# Patient Record
Sex: Female | Born: 1935 | Race: White | Hispanic: No | Marital: Married | Smoking: Former smoker
Health system: Southern US, Community
[De-identification: ages and names within clinical notes are randomized; demographics above are authoritative.]

## PROBLEM LIST (undated history)

## (undated) DIAGNOSIS — I739 Peripheral vascular disease, unspecified: Secondary | ICD-10-CM

## (undated) DIAGNOSIS — K219 Gastro-esophageal reflux disease without esophagitis: Secondary | ICD-10-CM

## (undated) DIAGNOSIS — J449 Chronic obstructive pulmonary disease, unspecified: Secondary | ICD-10-CM

## (undated) DIAGNOSIS — I10 Essential (primary) hypertension: Secondary | ICD-10-CM

## (undated) DIAGNOSIS — Z9981 Dependence on supplemental oxygen: Secondary | ICD-10-CM

## (undated) DIAGNOSIS — C9000 Multiple myeloma not having achieved remission: Secondary | ICD-10-CM

## (undated) DIAGNOSIS — I509 Heart failure, unspecified: Secondary | ICD-10-CM

## (undated) DIAGNOSIS — D649 Anemia, unspecified: Secondary | ICD-10-CM

## (undated) HISTORY — DX: Heart failure, unspecified: I50.9

## (undated) HISTORY — PX: OTHER SURGICAL HISTORY: SHX169

## (undated) HISTORY — PX: PORTACATH PLACEMENT: SHX2246

## (undated) HISTORY — DX: Gastro-esophageal reflux disease without esophagitis: K21.9

## (undated) HISTORY — DX: Chronic obstructive pulmonary disease, unspecified: J44.9

## (undated) HISTORY — PX: KYPHOPLASTY: SHX5884

## (undated) HISTORY — DX: Anemia, unspecified: D64.9

## (undated) HISTORY — DX: Essential (primary) hypertension: I10

## (undated) HISTORY — DX: Multiple myeloma not having achieved remission: C90.00

---

## 2011-09-12 ENCOUNTER — Other Ambulatory Visit (HOSPITAL_COMMUNITY): Payer: Self-pay | Admitting: Family Medicine

## 2011-09-12 DIAGNOSIS — IMO0002 Reserved for concepts with insufficient information to code with codable children: Secondary | ICD-10-CM

## 2011-09-20 DIAGNOSIS — R0602 Shortness of breath: Secondary | ICD-10-CM

## 2011-09-26 ENCOUNTER — Other Ambulatory Visit (HOSPITAL_COMMUNITY): Payer: Self-pay | Admitting: Internal Medicine

## 2011-09-26 DIAGNOSIS — IMO0002 Reserved for concepts with insufficient information to code with codable children: Secondary | ICD-10-CM

## 2011-09-27 ENCOUNTER — Encounter (HOSPITAL_COMMUNITY): Payer: Self-pay | Admitting: Pharmacy Technician

## 2011-09-30 ENCOUNTER — Ambulatory Visit (HOSPITAL_COMMUNITY)
Admission: RE | Admit: 2011-09-30 | Discharge: 2011-09-30 | Disposition: A | Discharge status: Home / Self Care | Payer: Medicare Other | Source: Ambulatory Visit | Attending: Internal Medicine | Admitting: Internal Medicine

## 2011-09-30 ENCOUNTER — Other Ambulatory Visit (HOSPITAL_COMMUNITY): Payer: Self-pay | Admitting: Interventional Radiology

## 2011-09-30 ENCOUNTER — Encounter (HOSPITAL_COMMUNITY): Payer: Self-pay | Admitting: Physician Assistant

## 2011-09-30 ENCOUNTER — Other Ambulatory Visit (HOSPITAL_COMMUNITY): Payer: Self-pay | Admitting: Physician Assistant

## 2011-09-30 DIAGNOSIS — IMO0002 Reserved for concepts with insufficient information to code with codable children: Secondary | ICD-10-CM

## 2011-09-30 NOTE — H&P (Signed)
Brooke Keller is an 76 y.o. female.   Chief Complaint: Acute onset of pain between shoulder blades after coughing that has been increasingly painful since mid Oct.   HPI: New diagnosis of multiple myeloma with significant lytic lesions throughout body.  Pain mostly between shoulder blades and lower back.  Denies radicular symptoms to arms or legs.  Does have more shallow breathing secondary to pain with deep breathing. Now requires 02 to assist.  Rates pain as a 10 on a scale of 1-10.  Denies bowel or bladder changes.  Pain mostly with supine position - improved with sitting upright.  To begin chemotherapy Wednesday.  Steroids and pain meds have only minimized pain to a 7 or 8 on a scale of 1-10.  Past Medical History  Diagnosis Date  . Multiple myeloma   . Hypertension   . Diabetes mellitus   . COPD (chronic obstructive pulmonary disease)   . GERD (gastroesophageal reflux disease)     Past Surgical History  Procedure Date  . Carpal tunne   . Right fifth toe    Social History:  reports that she quit smoking about 4 years ago. She does not have any smokeless tobacco history on file. She reports that she does not drink alcohol or use illicit drugs.  Allergies:  Allergies  Allergen Reactions  . Aspirin Other (See Comments)    Chest pain    Medications Prior to Admission  Medication Sig Dispense Refill  . albuterol (PROVENTIL) (2.5 MG/3ML) 0.083% nebulizer solution Take 2.5 mg by nebulization daily. Scheduled for lunch time every day       . amitriptyline (ELAVIL) 25 MG tablet Take 25 mg by mouth at bedtime.        Marland Kitchen atenolol (TENORMIN) 100 MG tablet Take 100 mg by mouth daily.        . calcitonin, salmon, (MIACALCIN/FORTICAL) 200 UNIT/ACT nasal spray Place 1 spray into the nose daily.        Marland Kitchen dexamethasone (DECADRON) 4 MG tablet Take 20 mg by mouth every 7 (seven) days. Friday       . ipratropium-albuterol (DUONEB) 0.5-2.5 (3) MG/3ML SOLN Take 3 mLs by nebulization 2 (two) times  daily.        . metFORMIN (GLUCOPHAGE) 1000 MG tablet Take 1,000 mg by mouth 2 (two) times daily.        Marland Kitchen oxyCODONE (OXY IR/ROXICODONE) 5 MG immediate release tablet Take 10 mg by mouth every 6 (six) hours as needed. For pain       . oxycodone (OXYCONTIN) 30 MG TB12 Take 60 mg by mouth every 12 (twelve) hours.         No current facility-administered medications on file as of 09/30/2011.    No results found for this or any previous visit (from the past 48 hour(s)). No results found.  Review of Systems  Constitutional: Positive for weight loss. Negative for fever and chills.  HENT: Positive for congestion.   Eyes: Negative.   Respiratory: Positive for cough and shortness of breath. Negative for sputum production and wheezing.   Cardiovascular: Positive for leg swelling. Negative for chest pain and claudication.  Gastrointestinal: Positive for heartburn, nausea and constipation. Negative for abdominal pain and blood in stool.  Genitourinary: Negative.   Musculoskeletal: Positive for myalgias, back pain and joint pain. Negative for falls.  Skin: Negative.   Neurological: Positive for tremors and weakness. Negative for focal weakness and seizures.  Endo/Heme/Allergies: Negative.   Psychiatric/Behavioral: Positive for depression. Negative for  memory loss. The patient is nervous/anxious.     There were no vitals taken for this visit. Physical Exam  Constitutional: She is oriented to person, place, and time. She appears well-developed and well-nourished. She appears distressed.  HENT:  Head: Normocephalic and atraumatic.  Neck: Normal range of motion.  Cardiovascular: Normal rate and regular rhythm.  Exam reveals no gallop and no friction rub.   No murmur heard. Respiratory: She is in respiratory distress. She has wheezes. She has rales. She exhibits no tenderness.       Bilateral wheezes at bases posteriorly - increased dyspnea noted due to pain   GI: Soft. Bowel sounds are normal.    Musculoskeletal: She exhibits edema. She exhibits no tenderness.  Neurological: She is alert and oriented to person, place, and time.  Skin: Skin is warm and dry.       Bilateral lower extremities with dependent edema and erythema with small scab to right 3rd toe   Psychiatric: She has a normal mood and affect. Thought content normal.     Assessment/Plan Patient presents today with her family to discuss kyphoplasty procedure for painful compression fractures. She knows of her diagnosis and the compression deformities involving T6, T7, T10 and L1.  Procedure details, risks and benefits discussed with the patient and her family.  She is eager to proceed to minimize her pain prior to starting chemotherapy.  She has been scheduled for a four level procedure if she can tolerate lying on her stomach, if not, will treat the two compression fractures in the mid thoracic area as this is where she is most symptomatic.   Total time spent with patient was 45 minutes.   CAMPBELL,PAMELA D 09/30/2011, 2:56 PM

## 2011-10-01 ENCOUNTER — Other Ambulatory Visit: Payer: Self-pay | Admitting: Radiology

## 2011-10-01 ENCOUNTER — Encounter (HOSPITAL_COMMUNITY): Payer: Self-pay

## 2011-10-01 ENCOUNTER — Ambulatory Visit (HOSPITAL_COMMUNITY)
Admission: RE | Admit: 2011-10-01 | Discharge: 2011-10-01 | Disposition: A | Discharge status: Home / Self Care | Payer: Medicare Other | Source: Ambulatory Visit | Attending: Interventional Radiology | Admitting: Interventional Radiology

## 2011-10-01 ENCOUNTER — Ambulatory Visit (HOSPITAL_COMMUNITY)
Admission: RE | Admit: 2011-10-01 | Discharge: 2011-10-01 | Disposition: A | Discharge status: Home / Self Care | Payer: Medicare Other | Source: Ambulatory Visit | Attending: Radiology | Admitting: Radiology

## 2011-10-01 ENCOUNTER — Other Ambulatory Visit (HOSPITAL_COMMUNITY): Payer: Self-pay | Admitting: Interventional Radiology

## 2011-10-01 DIAGNOSIS — Z5309 Procedure and treatment not carried out because of other contraindication: Secondary | ICD-10-CM | POA: Insufficient documentation

## 2011-10-01 DIAGNOSIS — N390 Urinary tract infection, site not specified: Secondary | ICD-10-CM | POA: Insufficient documentation

## 2011-10-01 DIAGNOSIS — IMO0002 Reserved for concepts with insufficient information to code with codable children: Secondary | ICD-10-CM

## 2011-10-01 DIAGNOSIS — C9000 Multiple myeloma not having achieved remission: Secondary | ICD-10-CM | POA: Insufficient documentation

## 2011-10-01 DIAGNOSIS — M8448XA Pathological fracture, other site, initial encounter for fracture: Secondary | ICD-10-CM | POA: Insufficient documentation

## 2011-10-01 LAB — CBC
MCHC: 33 g/dL (ref 30.0–36.0)
Platelets: 227 10*3/uL (ref 150–400)
RDW: 14.5 % (ref 11.5–15.5)
WBC: 7.5 10*3/uL (ref 4.0–10.5)

## 2011-10-01 LAB — URINALYSIS, ROUTINE W REFLEX MICROSCOPIC
Bilirubin Urine: NEGATIVE
Glucose, UA: 100 mg/dL — AB
Ketones, ur: NEGATIVE mg/dL
Nitrite: POSITIVE — AB
Protein, ur: NEGATIVE mg/dL

## 2011-10-01 LAB — DIFFERENTIAL
Eosinophils Absolute: 0.2 10*3/uL (ref 0.0–0.7)
Eosinophils Relative: 3 % (ref 0–5)
Lymphocytes Relative: 8 % — ABNORMAL LOW (ref 12–46)
Lymphs Abs: 0.5 10*3/uL — ABNORMAL LOW (ref 0.7–4.0)
Monocytes Absolute: 0.7 10*3/uL (ref 0.1–1.0)

## 2011-10-01 LAB — PROTIME-INR: INR: 1.01 (ref 0.00–1.49)

## 2011-10-01 LAB — BASIC METABOLIC PANEL
BUN: 28 mg/dL — ABNORMAL HIGH (ref 6–23)
Calcium: 9.5 mg/dL (ref 8.4–10.5)
Creatinine, Ser: 0.77 mg/dL (ref 0.50–1.10)
GFR calc Af Amer: >90 mL/min (ref >90)

## 2011-10-01 LAB — URINE MICROSCOPIC-ADD ON

## 2011-10-01 MED ORDER — MORPHINE SULFATE 2 MG/ML IJ SOLN
1.0000 mg | INTRAMUSCULAR | Status: DC | PRN
  Administered 2011-10-01: 1 mg via INTRAVENOUS

## 2011-10-01 MED ORDER — CIPROFLOXACIN IN D5W 400 MG/200ML IV SOLN
400.0000 mg | INTRAVENOUS | Status: AC
  Administered 2011-10-01: 400 mg via INTRAVENOUS
  Filled 2011-10-01: qty 200

## 2011-10-01 MED ORDER — SODIUM CHLORIDE 0.9 % IV SOLN: INTRAVENOUS | Status: DC

## 2011-10-01 MED ORDER — CEFAZOLIN SODIUM 1-5 GM-% IV SOLN
1.0000 g | INTRAVENOUS | Status: DC
  Filled 2011-10-01: qty 50

## 2011-10-01 MED ORDER — FENTANYL CITRATE 0.05 MG/ML IJ SOLN
INTRAMUSCULAR | Status: AC
  Filled 2011-10-01: qty 6

## 2011-10-01 MED ORDER — HYDROMORPHONE HCL PF 1 MG/ML IJ SOLN
INTRAMUSCULAR | Status: AC
  Filled 2011-10-01: qty 2

## 2011-10-01 MED ORDER — HYDRALAZINE HCL 20 MG/ML IJ SOLN
INTRAMUSCULAR | Status: AC
  Filled 2011-10-01: qty 1

## 2011-10-01 MED ORDER — MIDAZOLAM HCL 2 MG/2ML IJ SOLN
INTRAMUSCULAR | Status: AC
  Filled 2011-10-01: qty 6

## 2011-10-01 MED ORDER — MORPHINE SULFATE 4 MG/ML IJ SOLN
INTRAMUSCULAR | Status: AC
  Filled 2011-10-01: qty 1

## 2011-10-01 NOTE — Progress Notes (Signed)
Cipro infused.  Pt's IV d/c'd.  Pt discharged to the care of her daughters.

## 2011-10-01 NOTE — Progress Notes (Signed)
Patient presented to the [facility] today in preparation for kyphoplasty/vertebroplasty procedure to treat painful compression fractures secondary to multiple myeloma. The patient was noted to be febrile with an elevated WBC and UTI on urinalysis exam.  After discussion with the patient, family and Dr. Isabel Caprice who is covering for Dr. Earma Reading this week - it was elected to postpone the treatment for a few days until UTI was treated given the increased risks of infection seeding to the spine.  The patient was administered 400 mg IV cipro today and was provided a prescription for Cipro XR 500 mg to be taken bid x 5 days per Dr. Cleone Slim.  Dr. Cleone Slim is okay with rescheduling the treatment within the next 48 hours and the patient is to proceed with her chemotherapy.  They are all in agreement with this plan.  Labs will be rechecked upon her rescheduled date.

## 2011-10-02 ENCOUNTER — Other Ambulatory Visit: Payer: Self-pay | Admitting: Neurology

## 2011-10-03 ENCOUNTER — Ambulatory Visit (HOSPITAL_COMMUNITY): Admission: RE | Admit: 2011-10-03 | Payer: Medicare Other | Source: Ambulatory Visit

## 2011-10-10 ENCOUNTER — Other Ambulatory Visit (HOSPITAL_COMMUNITY): Payer: Self-pay | Admitting: Interventional Radiology

## 2011-10-10 ENCOUNTER — Encounter (HOSPITAL_COMMUNITY): Payer: Self-pay | Admitting: Pharmacy Technician

## 2011-10-11 ENCOUNTER — Ambulatory Visit (HOSPITAL_COMMUNITY)
Admission: RE | Admit: 2011-10-11 | Discharge: 2011-10-11 | Disposition: A | Discharge status: Home / Self Care | Payer: Medicare Other | Source: Ambulatory Visit | Attending: Interventional Radiology | Admitting: Interventional Radiology

## 2011-10-11 ENCOUNTER — Encounter (HOSPITAL_COMMUNITY): Payer: Self-pay

## 2011-10-11 ENCOUNTER — Other Ambulatory Visit: Payer: Self-pay | Admitting: Radiology

## 2011-10-11 DIAGNOSIS — Z538 Procedure and treatment not carried out for other reasons: Secondary | ICD-10-CM | POA: Insufficient documentation

## 2011-10-11 DIAGNOSIS — IMO0002 Reserved for concepts with insufficient information to code with codable children: Secondary | ICD-10-CM

## 2011-10-11 DIAGNOSIS — M8448XA Pathological fracture, other site, initial encounter for fracture: Secondary | ICD-10-CM | POA: Insufficient documentation

## 2011-10-11 LAB — DIFFERENTIAL
Eosinophils Relative: 5 % (ref 0–5)
Lymphocytes Relative: 8 % — ABNORMAL LOW (ref 12–46)
Lymphs Abs: 0.5 10*3/uL — ABNORMAL LOW (ref 0.7–4.0)
Monocytes Absolute: 0.5 10*3/uL (ref 0.1–1.0)

## 2011-10-11 LAB — CBC
HCT: 33.7 % — ABNORMAL LOW (ref 36.0–46.0)
MCH: 29.8 pg (ref 26.0–34.0)
MCV: 92.8 fL (ref 78.0–100.0)
RBC: 3.63 MIL/uL — ABNORMAL LOW (ref 3.87–5.11)
RDW: 14.8 % (ref 11.5–15.5)
WBC: 7.2 10*3/uL (ref 4.0–10.5)

## 2011-10-11 MED ORDER — TOBRAMYCIN SULFATE 1.2 G IJ SOLR
INTRAMUSCULAR | Status: AC
  Filled 2011-10-11: qty 1.2

## 2011-10-11 MED ORDER — CEFAZOLIN SODIUM 1-5 GM-% IV SOLN
1.0000 g | Freq: Once | INTRAVENOUS | Status: DC
  Filled 2011-10-11: qty 50

## 2011-10-11 MED ORDER — MIDAZOLAM HCL 2 MG/2ML IJ SOLN
INTRAMUSCULAR | Status: AC
  Filled 2011-10-11: qty 6

## 2011-10-11 MED ORDER — FENTANYL CITRATE 0.05 MG/ML IJ SOLN
INTRAMUSCULAR | Status: AC
  Filled 2011-10-11: qty 4

## 2011-10-11 MED ORDER — SODIUM CHLORIDE 0.9 % IV SOLN: INTRAVENOUS | Status: DC

## 2011-10-11 NOTE — H&P (Signed)
Brooke Keller is an 76 y.o. female.   Chief Complaint: Hx of Multiple Myeloma; painful fractures at Thoracic #6; #7; #10; Lumbar #1 HPI: scheduled today for T6; T7; T10; L1 Vertebroplasty / Kyphoplasty  Past Medical History  Diagnosis Date  . Hypertension   . Diabetes mellitus   . COPD (chronic obstructive pulmonary disease)   . GERD (gastroesophageal reflux disease)   . Multiple myeloma     Past Surgical History  Procedure Date  . Carpal tunne   . Right fifth toe     History reviewed. No pertinent family history. Social History:  reports that she quit smoking about 4 years ago. She quit smokeless tobacco use about 5 years ago. She reports that she does not drink alcohol or use illicit drugs.  Allergies:  Allergies  Allergen Reactions  . Aspirin Other (See Comments)    Chest pain    Medications Prior to Admission  Medication Sig Dispense Refill  . albuterol (PROVENTIL) (2.5 MG/3ML) 0.083% nebulizer solution Take 2.5 mg by nebulization daily. Scheduled for lunch time every day       . aspirin EC 81 MG tablet Take 81 mg by mouth daily.        . atenolol (TENORMIN) 100 MG tablet Take 100 mg by mouth daily.        . calcitonin, salmon, (MIACALCIN/FORTICAL) 200 UNIT/ACT nasal spray Place 1 spray into the nose daily. ALTERNATE NOSTRILS DAILY      . furosemide (LASIX) 20 MG tablet Take 20 mg by mouth daily.        . ipratropium-albuterol (DUONEB) 0.5-2.5 (3) MG/3ML SOLN Take 3 mLs by nebulization 2 (two) times daily.        . lenalidomide (REVLIMID) 10 MG capsule Take 10 mg by mouth daily. Take for 21 days       . lisinopril (PRINIVIL,ZESTRIL) 10 MG tablet Take 10 mg by mouth daily.        . LORazepam (ATIVAN) 1 MG tablet Take 1 mg by mouth every 4 (four) hours as needed. FOR ANXIETY       . metFORMIN (GLUCOPHAGE) 1000 MG tablet Take 1,000 mg by mouth 2 (two) times daily.        . morphine (MS CONTIN) 60 MG 12 hr tablet Take 60 mg by mouth every 12 (twelve) hours.        .  oxyCODONE (ROXICODONE) 15 MG immediate release tablet Take 15 mg by mouth 2 (two) times daily. FOR PAIN. CAN TAKE EVERY 6 HOURS IF NEEDED.        Medications Prior to Admission  Medication Dose Route Frequency Provider Last Rate Last Dose  . 0.9 %  sodium chloride infusion   Intravenous Continuous Baily Serpe A Cinsere Mizrahi, PA      . ceFAZolin (ANCEF) IVPB 1 g/50 mL premix  1 g Intravenous Once Waylen Depaolo A Avry Monteleone, PA        Results for orders placed during the hospital encounter of 10/11/11 (from the past 48 hour(s))  CBC     Status: Abnormal   Collection Time   10/11/11  9:53 AM      Component Value Range Comment   WBC 7.2  4.0 - 10.5 (K/uL)    RBC 3.63 (*) 3.87 - 5.11 (MIL/uL)    Hemoglobin 10.8 (*) 12.0 - 15.0 (g/dL)    HCT 33.7 (*) 36.0 - 46.0 (%)    MCV 92.8  78.0 - 100.0 (fL)    MCH 29.8  26.0 - 34.0 (pg)      MCHC 32.0  30.0 - 36.0 (g/dL)    RDW 14.8  11.5 - 15.5 (%)    Platelets 175  150 - 400 (K/uL)   DIFFERENTIAL     Status: Abnormal   Collection Time   10/11/11  9:53 AM      Component Value Range Comment   Neutrophils Relative 80 (*) 43 - 77 (%)    Neutro Abs 5.7  1.7 - 7.7 (K/uL)    Lymphocytes Relative 8 (*) 12 - 46 (%)    Lymphs Abs 0.5 (*) 0.7 - 4.0 (K/uL)    Monocytes Relative 7  3 - 12 (%)    Monocytes Absolute 0.5  0.1 - 1.0 (K/uL)    Eosinophils Relative 5  0 - 5 (%)    Eosinophils Absolute 0.3  0.0 - 0.7 (K/uL)    Basophils Relative 1  0 - 1 (%)    Basophils Absolute 0.0  0.0 - 0.1 (K/uL)    No results found.  Review of Systems  Constitutional: Negative for fever.  Respiratory: Negative for cough.   Cardiovascular: Negative for chest pain.  Gastrointestinal: Negative for nausea, vomiting and abdominal pain.    Blood pressure 167/80, pulse 62, temperature 97.1 F (36.2 C), temperature source Oral, resp. rate 16, height 4' 7" (1.397 m), weight 173 lb (78.472 kg), SpO2 94.00%. Physical Exam  Constitutional: She is oriented to person, place, and time. She appears  well-developed and well-nourished.  HENT:  Head: Normocephalic.  Eyes: EOM are normal.  Neck: Normal range of motion.  Cardiovascular: Normal rate, regular rhythm and normal heart sounds.   No murmur heard. Respiratory: Effort normal. She has no wheezes.       Distant lung sounds; Hx COPD  Musculoskeletal:       Uses wheel chair  Neurological: She is alert and oriented to person, place, and time.  Skin: Skin is warm and dry.     Assessment/Plan Pt with Hx of MM; now with painful fractures at T6/7/10;L1. Scheduled for Vertebroplasty/Kyphoplasty at these levels. Pt and family aware of procedure benefits and risks and agreeable to proceed. Consent signed.  Marrianne Sica A 10/11/2011, 10:19 AM    

## 2011-10-14 ENCOUNTER — Other Ambulatory Visit (HOSPITAL_COMMUNITY): Payer: Self-pay | Admitting: Interventional Radiology

## 2011-10-14 ENCOUNTER — Ambulatory Visit (HOSPITAL_COMMUNITY)
Admission: RE | Admit: 2011-10-14 | Discharge: 2011-10-14 | Disposition: A | Discharge status: Home / Self Care | Payer: Medicare Other | Source: Ambulatory Visit | Attending: Interventional Radiology | Admitting: Interventional Radiology

## 2011-10-14 DIAGNOSIS — J449 Chronic obstructive pulmonary disease, unspecified: Secondary | ICD-10-CM | POA: Insufficient documentation

## 2011-10-14 DIAGNOSIS — IMO0002 Reserved for concepts with insufficient information to code with codable children: Secondary | ICD-10-CM

## 2011-10-14 DIAGNOSIS — M8448XA Pathological fracture, other site, initial encounter for fracture: Secondary | ICD-10-CM | POA: Insufficient documentation

## 2011-10-14 DIAGNOSIS — I10 Essential (primary) hypertension: Secondary | ICD-10-CM | POA: Insufficient documentation

## 2011-10-14 DIAGNOSIS — K219 Gastro-esophageal reflux disease without esophagitis: Secondary | ICD-10-CM | POA: Insufficient documentation

## 2011-10-14 DIAGNOSIS — C9000 Multiple myeloma not having achieved remission: Secondary | ICD-10-CM | POA: Insufficient documentation

## 2011-10-14 DIAGNOSIS — E119 Type 2 diabetes mellitus without complications: Secondary | ICD-10-CM | POA: Insufficient documentation

## 2011-10-14 DIAGNOSIS — J4489 Other specified chronic obstructive pulmonary disease: Secondary | ICD-10-CM | POA: Insufficient documentation

## 2011-10-14 MED ORDER — MIDAZOLAM HCL 5 MG/5ML IJ SOLN
INTRAMUSCULAR | Status: AC | PRN
  Administered 2011-10-14: 1 mg via INTRAVENOUS

## 2011-10-14 MED ORDER — SODIUM CHLORIDE 0.9 % IV SOLN
INTRAVENOUS | Status: AC
Start: 2011-10-14 — End: 2011-10-14

## 2011-10-14 MED ORDER — TOBRAMYCIN SULFATE 1.2 G IJ SOLR
INTRAMUSCULAR | Status: AC
  Filled 2011-10-14: qty 1.2

## 2011-10-14 MED ORDER — SODIUM CHLORIDE 0.9 % IV SOLN
INTRAVENOUS | Status: AC | PRN
  Administered 2011-10-14: 75 mL/h via INTRAVENOUS

## 2011-10-14 MED ORDER — FENTANYL CITRATE 0.05 MG/ML IJ SOLN
INTRAMUSCULAR | Status: AC
  Filled 2011-10-14: qty 4

## 2011-10-14 MED ORDER — ALBUTEROL SULFATE (5 MG/ML) 0.5% IN NEBU
INHALATION_SOLUTION | RESPIRATORY_TRACT | Status: AC
  Administered 2011-10-14: 5 mg via ORAL
  Filled 2011-10-14: qty 0.5

## 2011-10-14 MED ORDER — FENTANYL CITRATE 0.05 MG/ML IJ SOLN
INTRAMUSCULAR | Status: AC | PRN
  Administered 2011-10-14: 25 ug via INTRAVENOUS

## 2011-10-14 MED ORDER — CEFAZOLIN SODIUM 1-5 GM-% IV SOLN
1.0000 g | Freq: Once | INTRAVENOUS | Status: DC
  Filled 2011-10-14: qty 50

## 2011-10-14 MED ORDER — MIDAZOLAM HCL 2 MG/2ML IJ SOLN
INTRAMUSCULAR | Status: AC
  Filled 2011-10-14: qty 6

## 2011-10-14 MED ORDER — ALBUTEROL SULFATE (5 MG/ML) 0.5% IN NEBU: 2.5000 mg | INHALATION_SOLUTION | Freq: Once | RESPIRATORY_TRACT | Status: DC

## 2011-10-14 MED ORDER — CEFAZOLIN SODIUM 1-5 GM-% IV SOLN
INTRAVENOUS | Status: AC
  Administered 2011-10-14: 1 g via INTRAVENOUS
  Filled 2011-10-14: qty 50

## 2011-10-14 MED ORDER — SODIUM CHLORIDE 0.9 % IV SOLN: INTRAVENOUS | Status: DC

## 2011-10-14 NOTE — Interval H&P Note (Cosign Needed)
History and Physical Interval Note: No new changes in ROS or PE since last seen - procedure had been cancelled due to room malfunction. Patient presents today for treatment of symptomatic compression fractures.   10/14/2011 9:05 AM  Brooke Keller  has presented today for surgery, with the diagnosis of * multiple painful compression fractures. *  The various methods of treatment have been discussed with the patient and family. After consideration of risks, benefits and other options for treatment, the patient has consented to vertebroplasty and kyphoplasty of T6, T7, T10 and L1  as a surgical intervention .  The patients' history has been reviewed, patient examined, no change in status, stable for surgery.  I have reviewed the patients' chart and labs.  Questions were answered to the patient's satisfaction.  Written consent obtained.    Brooke Keller

## 2011-10-14 NOTE — H&P (View-Only) (Signed)
Brooke Keller is an 76 y.o. female.   Chief Complaint: Hx of Multiple Myeloma; painful fractures at Thoracic #6; #7; #10; Lumbar #1 HPI: scheduled today for T6; T7; T10; L1 Vertebroplasty / Kyphoplasty  Past Medical History  Diagnosis Date  . Hypertension   . Diabetes mellitus   . COPD (chronic obstructive pulmonary disease)   . GERD (gastroesophageal reflux disease)   . Multiple myeloma     Past Surgical History  Procedure Date  . Carpal tunne   . Right fifth toe     History reviewed. No pertinent family history. Social History:  reports that she quit smoking about 4 years ago. She quit smokeless tobacco use about 5 years ago. She reports that she does not drink alcohol or use illicit drugs.  Allergies:  Allergies  Allergen Reactions  . Aspirin Other (See Comments)    Chest pain    Medications Prior to Admission  Medication Sig Dispense Refill  . albuterol (PROVENTIL) (2.5 MG/3ML) 0.083% nebulizer solution Take 2.5 mg by nebulization daily. Scheduled for lunch time every day       . aspirin EC 81 MG tablet Take 81 mg by mouth daily.        Marland Kitchen atenolol (TENORMIN) 100 MG tablet Take 100 mg by mouth daily.        . calcitonin, salmon, (MIACALCIN/FORTICAL) 200 UNIT/ACT nasal spray Place 1 spray into the nose daily. ALTERNATE NOSTRILS DAILY      . furosemide (LASIX) 20 MG tablet Take 20 mg by mouth daily.        Marland Kitchen ipratropium-albuterol (DUONEB) 0.5-2.5 (3) MG/3ML SOLN Take 3 mLs by nebulization 2 (two) times daily.        Marland Kitchen lenalidomide (REVLIMID) 10 MG capsule Take 10 mg by mouth daily. Take for 21 days       . lisinopril (PRINIVIL,ZESTRIL) 10 MG tablet Take 10 mg by mouth daily.        Marland Kitchen LORazepam (ATIVAN) 1 MG tablet Take 1 mg by mouth every 4 (four) hours as needed. FOR ANXIETY       . metFORMIN (GLUCOPHAGE) 1000 MG tablet Take 1,000 mg by mouth 2 (two) times daily.        Marland Kitchen morphine (MS CONTIN) 60 MG 12 hr tablet Take 60 mg by mouth every 12 (twelve) hours.        Marland Kitchen  oxyCODONE (ROXICODONE) 15 MG immediate release tablet Take 15 mg by mouth 2 (two) times daily. FOR PAIN. CAN TAKE EVERY 6 HOURS IF NEEDED.        Medications Prior to Admission  Medication Dose Route Frequency Provider Last Rate Last Dose  . 0.9 %  sodium chloride infusion   Intravenous Continuous Robet Leu, PA      . ceFAZolin (ANCEF) IVPB 1 g/50 mL premix  1 g Intravenous Once Robet Leu, PA        Results for orders placed during the hospital encounter of 10/11/11 (from the past 48 hour(s))  CBC     Status: Abnormal   Collection Time   10/11/11  9:53 AM      Component Value Range Comment   WBC 7.2  4.0 - 10.5 (K/uL)    RBC 3.63 (*) 3.87 - 5.11 (MIL/uL)    Hemoglobin 10.8 (*) 12.0 - 15.0 (g/dL)    HCT 40.9 (*) 81.1 - 46.0 (%)    MCV 92.8  78.0 - 100.0 (fL)    MCH 29.8  26.0 - 34.0 (pg)  MCHC 32.0  30.0 - 36.0 (g/dL)    RDW 16.1  09.6 - 04.5 (%)    Platelets 175  150 - 400 (K/uL)   DIFFERENTIAL     Status: Abnormal   Collection Time   10/11/11  9:53 AM      Component Value Range Comment   Neutrophils Relative 80 (*) 43 - 77 (%)    Neutro Abs 5.7  1.7 - 7.7 (K/uL)    Lymphocytes Relative 8 (*) 12 - 46 (%)    Lymphs Abs 0.5 (*) 0.7 - 4.0 (K/uL)    Monocytes Relative 7  3 - 12 (%)    Monocytes Absolute 0.5  0.1 - 1.0 (K/uL)    Eosinophils Relative 5  0 - 5 (%)    Eosinophils Absolute 0.3  0.0 - 0.7 (K/uL)    Basophils Relative 1  0 - 1 (%)    Basophils Absolute 0.0  0.0 - 0.1 (K/uL)    No results found.  Review of Systems  Constitutional: Negative for fever.  Respiratory: Negative for cough.   Cardiovascular: Negative for chest pain.  Gastrointestinal: Negative for nausea, vomiting and abdominal pain.    Blood pressure 167/80, pulse 62, temperature 97.1 F (36.2 C), temperature source Oral, resp. rate 16, height 4\' 7"  (1.397 m), weight 173 lb (78.472 kg), SpO2 94.00%. Physical Exam  Constitutional: She is oriented to person, place, and time. She appears  well-developed and well-nourished.  HENT:  Head: Normocephalic.  Eyes: EOM are normal.  Neck: Normal range of motion.  Cardiovascular: Normal rate, regular rhythm and normal heart sounds.   No murmur heard. Respiratory: Effort normal. She has no wheezes.       Distant lung sounds; Hx COPD  Musculoskeletal:       Uses wheel chair  Neurological: She is alert and oriented to person, place, and time.  Skin: Skin is warm and dry.     Assessment/Plan Pt with Hx of MM; now with painful fractures at T6/7/10;L1. Scheduled for Vertebroplasty/Kyphoplasty at these levels. Pt and family aware of procedure benefits and risks and agreeable to proceed. Consent signed.  Anelle Parlow A 10/11/2011, 10:19 AM

## 2011-10-14 NOTE — Procedures (Signed)
S/P T6 and T7  vertebroplasty for severe pain No acute complications.

## 2011-10-21 ENCOUNTER — Other Ambulatory Visit (HOSPITAL_COMMUNITY): Payer: Self-pay | Admitting: Interventional Radiology

## 2011-10-21 DIAGNOSIS — IMO0002 Reserved for concepts with insufficient information to code with codable children: Secondary | ICD-10-CM

## 2011-10-28 ENCOUNTER — Inpatient Hospital Stay (HOSPITAL_COMMUNITY): Admission: RE | Admit: 2011-10-28 | Payer: Medicare Other | Source: Ambulatory Visit

## 2012-02-26 ENCOUNTER — Encounter: Payer: Medicare Other | Admitting: Internal Medicine

## 2012-03-25 ENCOUNTER — Encounter: Payer: Medicare Other | Admitting: Internal Medicine

## 2012-03-25 DIAGNOSIS — C9000 Multiple myeloma not having achieved remission: Secondary | ICD-10-CM

## 2012-04-14 ENCOUNTER — Encounter (INDEPENDENT_AMBULATORY_CARE_PROVIDER_SITE_OTHER): Payer: Medicare Other | Admitting: Internal Medicine

## 2012-04-14 DIAGNOSIS — C8589 Other specified types of non-Hodgkin lymphoma, extranodal and solid organ sites: Secondary | ICD-10-CM

## 2012-04-14 DIAGNOSIS — D649 Anemia, unspecified: Secondary | ICD-10-CM

## 2012-04-14 DIAGNOSIS — J449 Chronic obstructive pulmonary disease, unspecified: Secondary | ICD-10-CM

## 2012-04-23 ENCOUNTER — Encounter: Payer: Medicare Other | Admitting: Internal Medicine

## 2012-04-27 DIAGNOSIS — C9000 Multiple myeloma not having achieved remission: Secondary | ICD-10-CM

## 2012-05-21 DIAGNOSIS — C9000 Multiple myeloma not having achieved remission: Secondary | ICD-10-CM

## 2012-05-26 ENCOUNTER — Encounter: Payer: Medicare Other | Admitting: Internal Medicine

## 2012-05-26 DIAGNOSIS — C9000 Multiple myeloma not having achieved remission: Secondary | ICD-10-CM

## 2012-05-26 DIAGNOSIS — D649 Anemia, unspecified: Secondary | ICD-10-CM

## 2012-06-23 DIAGNOSIS — C9000 Multiple myeloma not having achieved remission: Secondary | ICD-10-CM

## 2012-07-21 ENCOUNTER — Encounter: Payer: Medicare Other | Admitting: Internal Medicine

## 2012-07-21 DIAGNOSIS — C9000 Multiple myeloma not having achieved remission: Secondary | ICD-10-CM

## 2012-08-18 DIAGNOSIS — R944 Abnormal results of kidney function studies: Secondary | ICD-10-CM

## 2012-08-18 DIAGNOSIS — J449 Chronic obstructive pulmonary disease, unspecified: Secondary | ICD-10-CM

## 2012-08-18 DIAGNOSIS — C9000 Multiple myeloma not having achieved remission: Secondary | ICD-10-CM

## 2012-08-18 DIAGNOSIS — E119 Type 2 diabetes mellitus without complications: Secondary | ICD-10-CM

## 2012-09-07 ENCOUNTER — Encounter: Payer: Medicare Other | Admitting: Internal Medicine

## 2012-09-07 DIAGNOSIS — N189 Chronic kidney disease, unspecified: Secondary | ICD-10-CM

## 2012-09-07 DIAGNOSIS — D649 Anemia, unspecified: Secondary | ICD-10-CM

## 2012-09-07 DIAGNOSIS — C9000 Multiple myeloma not having achieved remission: Secondary | ICD-10-CM

## 2012-10-06 ENCOUNTER — Other Ambulatory Visit (HOSPITAL_COMMUNITY): Payer: Self-pay | Admitting: Internal Medicine

## 2012-10-06 DIAGNOSIS — C9000 Multiple myeloma not having achieved remission: Secondary | ICD-10-CM

## 2012-10-12 ENCOUNTER — Other Ambulatory Visit: Payer: Self-pay | Admitting: Radiology

## 2012-10-15 ENCOUNTER — Encounter (HOSPITAL_COMMUNITY): Payer: Self-pay

## 2012-10-15 ENCOUNTER — Other Ambulatory Visit (HOSPITAL_COMMUNITY): Payer: Self-pay | Admitting: Internal Medicine

## 2012-10-15 ENCOUNTER — Ambulatory Visit (HOSPITAL_COMMUNITY)
Admission: RE | Admit: 2012-10-15 | Discharge: 2012-10-15 | Disposition: A | Discharge status: Home / Self Care | Payer: Medicare Other | Source: Ambulatory Visit | Attending: Internal Medicine | Admitting: Internal Medicine

## 2012-10-15 DIAGNOSIS — C9000 Multiple myeloma not having achieved remission: Secondary | ICD-10-CM | POA: Insufficient documentation

## 2012-10-15 DIAGNOSIS — D649 Anemia, unspecified: Secondary | ICD-10-CM | POA: Insufficient documentation

## 2012-10-15 LAB — CBC WITH DIFFERENTIAL/PLATELET
Basophils Absolute: 0 10*3/uL (ref 0.0–0.1)
Eosinophils Relative: 1 % (ref 0–5)
HCT: 28.5 % — ABNORMAL LOW (ref 36.0–46.0)
Hemoglobin: 9.7 g/dL — ABNORMAL LOW (ref 12.0–15.0)
Lymphocytes Relative: 9 % — ABNORMAL LOW (ref 12–46)
Lymphs Abs: 0.4 10*3/uL — ABNORMAL LOW (ref 0.7–4.0)
MCV: 95.6 fL (ref 78.0–100.0)
Monocytes Absolute: 0.3 10*3/uL (ref 0.1–1.0)
Monocytes Relative: 7 % (ref 3–12)
Neutro Abs: 3.7 10*3/uL (ref 1.7–7.7)
RBC: 2.98 MIL/uL — ABNORMAL LOW (ref 3.87–5.11)
RDW: 18.7 % — ABNORMAL HIGH (ref 11.5–15.5)
WBC: 4.4 10*3/uL (ref 4.0–10.5)

## 2012-10-15 LAB — APTT: aPTT: 24 seconds (ref 24–37)

## 2012-10-15 MED ORDER — CEFAZOLIN SODIUM 1-5 GM-% IV SOLN
1.0000 g | Freq: Once | INTRAVENOUS | Status: AC
  Administered 2012-10-15: 1 g via INTRAVENOUS
  Filled 2012-10-15: qty 50

## 2012-10-15 MED ORDER — SODIUM CHLORIDE 0.9 % IR SOLN
Status: AC | PRN
  Administered 2012-10-15: 15:00:00

## 2012-10-15 MED ORDER — SODIUM CHLORIDE 0.9 % IV SOLN
Freq: Once | INTRAVENOUS | Status: AC
  Administered 2012-10-15: 14:00:00 via INTRAVENOUS

## 2012-10-15 MED ORDER — MIDAZOLAM HCL 2 MG/2ML IJ SOLN
INTRAMUSCULAR | Status: AC | PRN
  Administered 2012-10-15 (×2): 1 mg via INTRAVENOUS

## 2012-10-15 MED ORDER — MIDAZOLAM HCL 2 MG/2ML IJ SOLN
INTRAMUSCULAR | Status: AC
  Filled 2012-10-15: qty 4

## 2012-10-15 MED ORDER — FENTANYL CITRATE 0.05 MG/ML IJ SOLN
INTRAMUSCULAR | Status: AC
  Filled 2012-10-15: qty 4

## 2012-10-15 MED ORDER — FENTANYL CITRATE 0.05 MG/ML IJ SOLN
INTRAMUSCULAR | Status: AC | PRN
  Administered 2012-10-15: 50 ug via INTRAVENOUS

## 2012-10-15 NOTE — H&P (Signed)
Chief Complaint: "I'm here for port" Referring Physician:Darovsky HPI: Brooke Keller is an 78 y.o. female with myeloma and is referred to IR for port placement as she is to receive chemotherapy. PMHX and meds reviewed. She has otherwise been feeling ok. She is on chronic home O2 for COPD but denies SOB, CP  Past Medical History:  Past Medical History  Diagnosis Date  . Hypertension   . Diabetes mellitus   . COPD (chronic obstructive pulmonary disease)   . GERD (gastroesophageal reflux disease)   . Multiple myeloma(203.0)     Past Surgical History:  Past Surgical History  Procedure Date  . Carpal tunne   . Right fifth toe     Family History: No family history on file.  Social History:  reports that she quit smoking about 5 years ago. She quit smokeless tobacco use about 6 years ago. She reports that she does not drink alcohol or use illicit drugs.  Allergies:  Allergies  Allergen Reactions  . Ativan (Lorazepam) Other (See Comments)    hallucinations    Medications: Proventil, ASA, Tenormin, Miacalcin, Lasix, Prinvil, Revlimid, Glucophage, Oxycontin.  Please HPI for pertinent positives, otherwise complete 10 system ROS negative.  Physical Exam: Blood pressure 122/82, pulse 87, temperature 98.3 F (36.8 C), SpO2 98.00%. There is no height or weight on file to calculate BMI.   General Appearance:  Alert, cooperative, no distress, appears stated age  Head:  Normocephalic, without obvious abnormality, atraumatic  ENT: Unremarkable  Neck: Supple, symmetrical, trachea midline, no adenopathy, thyroid: not enlarged, symmetric, no tenderness/mass/nodules  Lungs:   Clear to auscultation bilaterally, no w/r/r, respirations unlabored without use of accessory muscles.  Chest Wall:  No tenderness or deformity  Heart:  Regular rate and rhythm, S1, S2 normal, no murmur, rub or gallop. Carotids 2+ without bruit.  Neurologic: Normal affect, no gross deficits.   Results for orders  placed during the hospital encounter of 10/15/12 (from the past 48 hour(s))  APTT     Status: Normal   Collection Time   10/15/12 12:15 PM      Component Value Range Comment   aPTT 24  24 - 37 seconds   CBC WITH DIFFERENTIAL     Status: Abnormal   Collection Time   10/15/12 12:15 PM      Component Value Range Comment   WBC 4.4  4.0 - 10.5 K/uL    RBC 2.98 (*) 3.87 - 5.11 MIL/uL    Hemoglobin 9.7 (*) 12.0 - 15.0 g/dL    HCT 40.9 (*) 81.1 - 46.0 %    MCV 95.6  78.0 - 100.0 fL    MCH 32.6  26.0 - 34.0 pg    MCHC 34.0  30.0 - 36.0 g/dL    RDW 91.4 (*) 78.2 - 15.5 %    Platelets 158  150 - 400 K/uL    Neutrophils Relative 83 (*) 43 - 77 %    Neutro Abs 3.7  1.7 - 7.7 K/uL    Lymphocytes Relative 9 (*) 12 - 46 %    Lymphs Abs 0.4 (*) 0.7 - 4.0 K/uL    Monocytes Relative 7  3 - 12 %    Monocytes Absolute 0.3  0.1 - 1.0 K/uL    Eosinophils Relative 1  0 - 5 %    Eosinophils Absolute 0.0  0.0 - 0.7 K/uL    Basophils Relative 0  0 - 1 %    Basophils Absolute 0.0  0.0 - 0.1 K/uL  PROTIME-INR     Status: Normal   Collection Time   10/15/12 12:15 PM      Component Value Range Comment   Prothrombin Time 12.5  11.6 - 15.2 seconds    INR 0.94  0.00 - 1.49   GLUCOSE, CAPILLARY     Status: Abnormal   Collection Time   10/15/12 12:43 PM      Component Value Range Comment   Glucose-Capillary 132 (*) 70 - 99 mg/dL    No results found.  Assessment/Plan Myeloma For port placement, discussed procedure, risks, complications. Labs reviewed, ok. Consent signed in chart.  Brayton El PA-C 10/15/2012, 1:03 PM

## 2012-10-15 NOTE — Procedures (Signed)
Successful RT IJ POWER PAC PLACEMENT NO COMP STABLE TIP SVC/RA READY FOR USE FULL REPORT IN PACS

## 2012-10-19 ENCOUNTER — Encounter: Payer: Medicare Other | Admitting: Internal Medicine

## 2012-10-19 DIAGNOSIS — D638 Anemia in other chronic diseases classified elsewhere: Secondary | ICD-10-CM

## 2012-10-19 DIAGNOSIS — D509 Iron deficiency anemia, unspecified: Secondary | ICD-10-CM

## 2012-10-19 DIAGNOSIS — N289 Disorder of kidney and ureter, unspecified: Secondary | ICD-10-CM

## 2012-10-19 DIAGNOSIS — C9000 Multiple myeloma not having achieved remission: Secondary | ICD-10-CM

## 2012-10-26 DIAGNOSIS — D509 Iron deficiency anemia, unspecified: Secondary | ICD-10-CM

## 2012-10-26 DIAGNOSIS — C9000 Multiple myeloma not having achieved remission: Secondary | ICD-10-CM

## 2012-11-02 DIAGNOSIS — D509 Iron deficiency anemia, unspecified: Secondary | ICD-10-CM

## 2012-11-02 DIAGNOSIS — C9000 Multiple myeloma not having achieved remission: Secondary | ICD-10-CM

## 2012-11-09 DIAGNOSIS — D509 Iron deficiency anemia, unspecified: Secondary | ICD-10-CM

## 2012-11-09 DIAGNOSIS — C9000 Multiple myeloma not having achieved remission: Secondary | ICD-10-CM

## 2012-12-22 ENCOUNTER — Encounter: Payer: Medicare Other | Admitting: Internal Medicine

## 2012-12-22 DIAGNOSIS — C9000 Multiple myeloma not having achieved remission: Secondary | ICD-10-CM

## 2013-02-02 DIAGNOSIS — Z452 Encounter for adjustment and management of vascular access device: Secondary | ICD-10-CM

## 2013-02-02 DIAGNOSIS — C9000 Multiple myeloma not having achieved remission: Secondary | ICD-10-CM

## 2013-03-16 DIAGNOSIS — C9000 Multiple myeloma not having achieved remission: Secondary | ICD-10-CM

## 2013-04-05 ENCOUNTER — Encounter (INDEPENDENT_AMBULATORY_CARE_PROVIDER_SITE_OTHER): Payer: Medicare Other | Admitting: Internal Medicine

## 2013-04-05 DIAGNOSIS — C9000 Multiple myeloma not having achieved remission: Secondary | ICD-10-CM

## 2013-04-13 DIAGNOSIS — C9000 Multiple myeloma not having achieved remission: Secondary | ICD-10-CM

## 2013-05-24 DIAGNOSIS — Z452 Encounter for adjustment and management of vascular access device: Secondary | ICD-10-CM

## 2013-05-24 DIAGNOSIS — C9000 Multiple myeloma not having achieved remission: Secondary | ICD-10-CM

## 2013-06-16 DIAGNOSIS — C9000 Multiple myeloma not having achieved remission: Secondary | ICD-10-CM

## 2013-07-20 DIAGNOSIS — Z452 Encounter for adjustment and management of vascular access device: Secondary | ICD-10-CM

## 2013-07-20 DIAGNOSIS — C9000 Multiple myeloma not having achieved remission: Secondary | ICD-10-CM

## 2013-08-05 DIAGNOSIS — Z23 Encounter for immunization: Secondary | ICD-10-CM

## 2013-08-05 DIAGNOSIS — C9000 Multiple myeloma not having achieved remission: Secondary | ICD-10-CM

## 2013-08-05 DIAGNOSIS — J449 Chronic obstructive pulmonary disease, unspecified: Secondary | ICD-10-CM

## 2013-08-05 DIAGNOSIS — Z862 Personal history of diseases of the blood and blood-forming organs and certain disorders involving the immune mechanism: Secondary | ICD-10-CM

## 2013-08-05 DIAGNOSIS — D7589 Other specified diseases of blood and blood-forming organs: Secondary | ICD-10-CM

## 2013-08-17 DIAGNOSIS — C9000 Multiple myeloma not having achieved remission: Secondary | ICD-10-CM

## 2013-08-17 DIAGNOSIS — E538 Deficiency of other specified B group vitamins: Secondary | ICD-10-CM

## 2013-08-18 DIAGNOSIS — E538 Deficiency of other specified B group vitamins: Secondary | ICD-10-CM

## 2013-08-19 DIAGNOSIS — E538 Deficiency of other specified B group vitamins: Secondary | ICD-10-CM

## 2013-08-20 DIAGNOSIS — E538 Deficiency of other specified B group vitamins: Secondary | ICD-10-CM

## 2013-08-23 DIAGNOSIS — E538 Deficiency of other specified B group vitamins: Secondary | ICD-10-CM

## 2013-08-30 DIAGNOSIS — E538 Deficiency of other specified B group vitamins: Secondary | ICD-10-CM

## 2013-08-30 DIAGNOSIS — C9000 Multiple myeloma not having achieved remission: Secondary | ICD-10-CM

## 2013-09-03 DIAGNOSIS — E538 Deficiency of other specified B group vitamins: Secondary | ICD-10-CM

## 2013-09-13 DIAGNOSIS — C9000 Multiple myeloma not having achieved remission: Secondary | ICD-10-CM

## 2013-09-20 DIAGNOSIS — E538 Deficiency of other specified B group vitamins: Secondary | ICD-10-CM

## 2013-09-20 DIAGNOSIS — Z95828 Presence of other vascular implants and grafts: Secondary | ICD-10-CM | POA: Insufficient documentation

## 2013-11-10 ENCOUNTER — Encounter (INDEPENDENT_AMBULATORY_CARE_PROVIDER_SITE_OTHER): Payer: Medicare Other | Admitting: Ophthalmology

## 2013-11-10 DIAGNOSIS — I10 Essential (primary) hypertension: Secondary | ICD-10-CM

## 2013-11-10 DIAGNOSIS — E11319 Type 2 diabetes mellitus with unspecified diabetic retinopathy without macular edema: Secondary | ICD-10-CM

## 2013-11-10 DIAGNOSIS — H43819 Vitreous degeneration, unspecified eye: Secondary | ICD-10-CM

## 2013-11-10 DIAGNOSIS — E1139 Type 2 diabetes mellitus with other diabetic ophthalmic complication: Secondary | ICD-10-CM

## 2013-11-10 DIAGNOSIS — E1165 Type 2 diabetes mellitus with hyperglycemia: Secondary | ICD-10-CM

## 2013-11-10 DIAGNOSIS — H4089 Other specified glaucoma: Secondary | ICD-10-CM

## 2013-11-10 DIAGNOSIS — H35039 Hypertensive retinopathy, unspecified eye: Secondary | ICD-10-CM

## 2013-11-10 DIAGNOSIS — E11359 Type 2 diabetes mellitus with proliferative diabetic retinopathy without macular edema: Secondary | ICD-10-CM

## 2014-03-10 ENCOUNTER — Ambulatory Visit (INDEPENDENT_AMBULATORY_CARE_PROVIDER_SITE_OTHER): Payer: Medicare Other | Admitting: Ophthalmology

## 2014-11-02 DIAGNOSIS — J449 Chronic obstructive pulmonary disease, unspecified: Secondary | ICD-10-CM | POA: Diagnosis not present

## 2014-11-10 DIAGNOSIS — C9000 Multiple myeloma not having achieved remission: Secondary | ICD-10-CM | POA: Diagnosis not present

## 2014-11-10 DIAGNOSIS — E538 Deficiency of other specified B group vitamins: Secondary | ICD-10-CM | POA: Diagnosis not present

## 2014-12-03 DIAGNOSIS — J449 Chronic obstructive pulmonary disease, unspecified: Secondary | ICD-10-CM | POA: Diagnosis not present

## 2014-12-07 DIAGNOSIS — E538 Deficiency of other specified B group vitamins: Secondary | ICD-10-CM | POA: Diagnosis not present

## 2014-12-27 DIAGNOSIS — R0689 Other abnormalities of breathing: Secondary | ICD-10-CM | POA: Diagnosis not present

## 2014-12-27 DIAGNOSIS — G4733 Obstructive sleep apnea (adult) (pediatric): Secondary | ICD-10-CM | POA: Diagnosis not present

## 2014-12-27 DIAGNOSIS — J9610 Chronic respiratory failure, unspecified whether with hypoxia or hypercapnia: Secondary | ICD-10-CM | POA: Diagnosis not present

## 2014-12-27 DIAGNOSIS — E669 Obesity, unspecified: Secondary | ICD-10-CM | POA: Diagnosis not present

## 2014-12-27 DIAGNOSIS — J449 Chronic obstructive pulmonary disease, unspecified: Secondary | ICD-10-CM | POA: Diagnosis not present

## 2014-12-27 DIAGNOSIS — Z23 Encounter for immunization: Secondary | ICD-10-CM | POA: Diagnosis not present

## 2015-01-01 DIAGNOSIS — J449 Chronic obstructive pulmonary disease, unspecified: Secondary | ICD-10-CM | POA: Diagnosis not present

## 2015-01-05 DIAGNOSIS — E538 Deficiency of other specified B group vitamins: Secondary | ICD-10-CM | POA: Diagnosis not present

## 2015-01-05 DIAGNOSIS — C9000 Multiple myeloma not having achieved remission: Secondary | ICD-10-CM | POA: Diagnosis not present

## 2015-02-01 DIAGNOSIS — J449 Chronic obstructive pulmonary disease, unspecified: Secondary | ICD-10-CM | POA: Diagnosis not present

## 2015-02-02 DIAGNOSIS — C9000 Multiple myeloma not having achieved remission: Secondary | ICD-10-CM | POA: Diagnosis not present

## 2015-02-02 DIAGNOSIS — E538 Deficiency of other specified B group vitamins: Secondary | ICD-10-CM | POA: Diagnosis not present

## 2015-02-09 DIAGNOSIS — Z95828 Presence of other vascular implants and grafts: Secondary | ICD-10-CM | POA: Diagnosis not present

## 2015-02-09 DIAGNOSIS — G893 Neoplasm related pain (acute) (chronic): Secondary | ICD-10-CM | POA: Diagnosis not present

## 2015-02-09 DIAGNOSIS — E538 Deficiency of other specified B group vitamins: Secondary | ICD-10-CM | POA: Diagnosis not present

## 2015-02-09 DIAGNOSIS — C9000 Multiple myeloma not having achieved remission: Secondary | ICD-10-CM | POA: Diagnosis not present

## 2015-02-20 DIAGNOSIS — N183 Chronic kidney disease, stage 3 (moderate): Secondary | ICD-10-CM | POA: Diagnosis not present

## 2015-02-20 DIAGNOSIS — D630 Anemia in neoplastic disease: Secondary | ICD-10-CM | POA: Diagnosis not present

## 2015-02-20 DIAGNOSIS — E1165 Type 2 diabetes mellitus with hyperglycemia: Secondary | ICD-10-CM | POA: Diagnosis not present

## 2015-02-20 DIAGNOSIS — I10 Essential (primary) hypertension: Secondary | ICD-10-CM | POA: Diagnosis not present

## 2015-02-20 DIAGNOSIS — E871 Hypo-osmolality and hyponatremia: Secondary | ICD-10-CM | POA: Diagnosis not present

## 2015-02-27 DIAGNOSIS — C9002 Multiple myeloma in relapse: Secondary | ICD-10-CM | POA: Diagnosis not present

## 2015-02-27 DIAGNOSIS — E871 Hypo-osmolality and hyponatremia: Secondary | ICD-10-CM | POA: Diagnosis not present

## 2015-02-27 DIAGNOSIS — I10 Essential (primary) hypertension: Secondary | ICD-10-CM | POA: Diagnosis not present

## 2015-02-27 DIAGNOSIS — E1165 Type 2 diabetes mellitus with hyperglycemia: Secondary | ICD-10-CM | POA: Diagnosis not present

## 2015-02-27 DIAGNOSIS — D630 Anemia in neoplastic disease: Secondary | ICD-10-CM | POA: Diagnosis not present

## 2015-02-27 DIAGNOSIS — Z1389 Encounter for screening for other disorder: Secondary | ICD-10-CM | POA: Diagnosis not present

## 2015-03-02 DIAGNOSIS — C9000 Multiple myeloma not having achieved remission: Secondary | ICD-10-CM | POA: Diagnosis not present

## 2015-03-02 DIAGNOSIS — E538 Deficiency of other specified B group vitamins: Secondary | ICD-10-CM | POA: Diagnosis not present

## 2015-03-03 DIAGNOSIS — J449 Chronic obstructive pulmonary disease, unspecified: Secondary | ICD-10-CM | POA: Diagnosis not present

## 2015-03-30 DIAGNOSIS — C9000 Multiple myeloma not having achieved remission: Secondary | ICD-10-CM | POA: Diagnosis not present

## 2015-03-30 DIAGNOSIS — E538 Deficiency of other specified B group vitamins: Secondary | ICD-10-CM | POA: Diagnosis not present

## 2015-04-03 DIAGNOSIS — J449 Chronic obstructive pulmonary disease, unspecified: Secondary | ICD-10-CM | POA: Diagnosis not present

## 2015-04-27 DIAGNOSIS — E538 Deficiency of other specified B group vitamins: Secondary | ICD-10-CM | POA: Diagnosis not present

## 2015-04-27 DIAGNOSIS — C9000 Multiple myeloma not having achieved remission: Secondary | ICD-10-CM | POA: Diagnosis not present

## 2015-05-03 DIAGNOSIS — J449 Chronic obstructive pulmonary disease, unspecified: Secondary | ICD-10-CM | POA: Diagnosis not present

## 2015-06-03 DIAGNOSIS — J449 Chronic obstructive pulmonary disease, unspecified: Secondary | ICD-10-CM | POA: Diagnosis not present

## 2015-06-06 DIAGNOSIS — S51801A Unspecified open wound of right forearm, initial encounter: Secondary | ICD-10-CM | POA: Diagnosis not present

## 2015-06-08 DIAGNOSIS — Z95828 Presence of other vascular implants and grafts: Secondary | ICD-10-CM | POA: Diagnosis not present

## 2015-06-28 DIAGNOSIS — J449 Chronic obstructive pulmonary disease, unspecified: Secondary | ICD-10-CM | POA: Diagnosis not present

## 2015-06-28 DIAGNOSIS — G4733 Obstructive sleep apnea (adult) (pediatric): Secondary | ICD-10-CM | POA: Diagnosis not present

## 2015-06-28 DIAGNOSIS — R0689 Other abnormalities of breathing: Secondary | ICD-10-CM | POA: Diagnosis not present

## 2015-06-28 DIAGNOSIS — Z87891 Personal history of nicotine dependence: Secondary | ICD-10-CM | POA: Diagnosis not present

## 2015-06-28 DIAGNOSIS — Z23 Encounter for immunization: Secondary | ICD-10-CM | POA: Diagnosis not present

## 2015-07-04 DIAGNOSIS — J449 Chronic obstructive pulmonary disease, unspecified: Secondary | ICD-10-CM | POA: Diagnosis not present

## 2015-07-27 DIAGNOSIS — J439 Emphysema, unspecified: Secondary | ICD-10-CM | POA: Diagnosis not present

## 2015-07-27 DIAGNOSIS — E1169 Type 2 diabetes mellitus with other specified complication: Secondary | ICD-10-CM | POA: Diagnosis not present

## 2015-07-27 DIAGNOSIS — R5383 Other fatigue: Secondary | ICD-10-CM | POA: Diagnosis not present

## 2015-07-27 DIAGNOSIS — C9002 Multiple myeloma in relapse: Secondary | ICD-10-CM | POA: Diagnosis not present

## 2015-07-27 DIAGNOSIS — E1122 Type 2 diabetes mellitus with diabetic chronic kidney disease: Secondary | ICD-10-CM | POA: Diagnosis not present

## 2015-07-27 DIAGNOSIS — E1165 Type 2 diabetes mellitus with hyperglycemia: Secondary | ICD-10-CM | POA: Diagnosis not present

## 2015-07-28 DIAGNOSIS — E538 Deficiency of other specified B group vitamins: Secondary | ICD-10-CM | POA: Diagnosis not present

## 2015-07-28 DIAGNOSIS — C9000 Multiple myeloma not having achieved remission: Secondary | ICD-10-CM | POA: Diagnosis not present

## 2015-08-03 DIAGNOSIS — J449 Chronic obstructive pulmonary disease, unspecified: Secondary | ICD-10-CM | POA: Diagnosis not present

## 2015-08-16 DIAGNOSIS — Z95828 Presence of other vascular implants and grafts: Secondary | ICD-10-CM | POA: Diagnosis not present

## 2015-08-16 DIAGNOSIS — C9000 Multiple myeloma not having achieved remission: Secondary | ICD-10-CM | POA: Diagnosis not present

## 2015-08-30 DIAGNOSIS — N183 Chronic kidney disease, stage 3 (moderate): Secondary | ICD-10-CM | POA: Diagnosis not present

## 2015-08-30 DIAGNOSIS — E1122 Type 2 diabetes mellitus with diabetic chronic kidney disease: Secondary | ICD-10-CM | POA: Diagnosis not present

## 2015-08-30 DIAGNOSIS — E871 Hypo-osmolality and hyponatremia: Secondary | ICD-10-CM | POA: Diagnosis not present

## 2015-08-30 DIAGNOSIS — I10 Essential (primary) hypertension: Secondary | ICD-10-CM | POA: Diagnosis not present

## 2015-08-30 DIAGNOSIS — D630 Anemia in neoplastic disease: Secondary | ICD-10-CM | POA: Diagnosis not present

## 2015-09-03 DIAGNOSIS — J449 Chronic obstructive pulmonary disease, unspecified: Secondary | ICD-10-CM | POA: Diagnosis not present

## 2015-09-04 DIAGNOSIS — Z0001 Encounter for general adult medical examination with abnormal findings: Secondary | ICD-10-CM | POA: Diagnosis not present

## 2015-09-04 DIAGNOSIS — Z23 Encounter for immunization: Secondary | ICD-10-CM | POA: Diagnosis not present

## 2015-09-04 DIAGNOSIS — H6123 Impacted cerumen, bilateral: Secondary | ICD-10-CM | POA: Diagnosis not present

## 2015-09-04 DIAGNOSIS — E559 Vitamin D deficiency, unspecified: Secondary | ICD-10-CM | POA: Diagnosis not present

## 2015-09-27 DIAGNOSIS — C9000 Multiple myeloma not having achieved remission: Secondary | ICD-10-CM | POA: Diagnosis not present

## 2015-09-27 DIAGNOSIS — Z95828 Presence of other vascular implants and grafts: Secondary | ICD-10-CM | POA: Diagnosis not present

## 2015-10-03 DIAGNOSIS — J449 Chronic obstructive pulmonary disease, unspecified: Secondary | ICD-10-CM | POA: Diagnosis not present

## 2015-10-27 DIAGNOSIS — E538 Deficiency of other specified B group vitamins: Secondary | ICD-10-CM | POA: Diagnosis not present

## 2015-10-27 DIAGNOSIS — C9000 Multiple myeloma not having achieved remission: Secondary | ICD-10-CM | POA: Diagnosis not present

## 2015-10-27 DIAGNOSIS — G893 Neoplasm related pain (acute) (chronic): Secondary | ICD-10-CM | POA: Diagnosis not present

## 2015-10-27 DIAGNOSIS — Z95828 Presence of other vascular implants and grafts: Secondary | ICD-10-CM | POA: Diagnosis not present

## 2015-11-03 DIAGNOSIS — J449 Chronic obstructive pulmonary disease, unspecified: Secondary | ICD-10-CM | POA: Diagnosis not present

## 2015-12-04 DIAGNOSIS — J449 Chronic obstructive pulmonary disease, unspecified: Secondary | ICD-10-CM | POA: Diagnosis not present

## 2015-12-05 DIAGNOSIS — E1122 Type 2 diabetes mellitus with diabetic chronic kidney disease: Secondary | ICD-10-CM | POA: Diagnosis not present

## 2015-12-05 DIAGNOSIS — E871 Hypo-osmolality and hyponatremia: Secondary | ICD-10-CM | POA: Diagnosis not present

## 2015-12-05 DIAGNOSIS — N183 Chronic kidney disease, stage 3 (moderate): Secondary | ICD-10-CM | POA: Diagnosis not present

## 2015-12-05 DIAGNOSIS — I10 Essential (primary) hypertension: Secondary | ICD-10-CM | POA: Diagnosis not present

## 2015-12-05 DIAGNOSIS — D630 Anemia in neoplastic disease: Secondary | ICD-10-CM | POA: Diagnosis not present

## 2015-12-08 DIAGNOSIS — Z95828 Presence of other vascular implants and grafts: Secondary | ICD-10-CM | POA: Diagnosis not present

## 2015-12-11 DIAGNOSIS — D630 Anemia in neoplastic disease: Secondary | ICD-10-CM | POA: Diagnosis not present

## 2015-12-11 DIAGNOSIS — I10 Essential (primary) hypertension: Secondary | ICD-10-CM | POA: Diagnosis not present

## 2015-12-11 DIAGNOSIS — E1165 Type 2 diabetes mellitus with hyperglycemia: Secondary | ICD-10-CM | POA: Diagnosis not present

## 2015-12-11 DIAGNOSIS — C9002 Multiple myeloma in relapse: Secondary | ICD-10-CM | POA: Diagnosis not present

## 2015-12-11 DIAGNOSIS — E1122 Type 2 diabetes mellitus with diabetic chronic kidney disease: Secondary | ICD-10-CM | POA: Diagnosis not present

## 2015-12-27 DIAGNOSIS — R05 Cough: Secondary | ICD-10-CM | POA: Diagnosis not present

## 2015-12-27 DIAGNOSIS — R0689 Other abnormalities of breathing: Secondary | ICD-10-CM | POA: Diagnosis not present

## 2015-12-27 DIAGNOSIS — G4733 Obstructive sleep apnea (adult) (pediatric): Secondary | ICD-10-CM | POA: Diagnosis not present

## 2015-12-27 DIAGNOSIS — J9610 Chronic respiratory failure, unspecified whether with hypoxia or hypercapnia: Secondary | ICD-10-CM | POA: Diagnosis not present

## 2016-01-01 DIAGNOSIS — J449 Chronic obstructive pulmonary disease, unspecified: Secondary | ICD-10-CM | POA: Diagnosis not present

## 2016-01-25 DIAGNOSIS — R52 Pain, unspecified: Secondary | ICD-10-CM | POA: Diagnosis not present

## 2016-01-25 DIAGNOSIS — E538 Deficiency of other specified B group vitamins: Secondary | ICD-10-CM | POA: Diagnosis not present

## 2016-01-25 DIAGNOSIS — C9000 Multiple myeloma not having achieved remission: Secondary | ICD-10-CM | POA: Diagnosis not present

## 2016-02-01 DIAGNOSIS — J449 Chronic obstructive pulmonary disease, unspecified: Secondary | ICD-10-CM | POA: Diagnosis not present

## 2016-03-02 DIAGNOSIS — J449 Chronic obstructive pulmonary disease, unspecified: Secondary | ICD-10-CM | POA: Diagnosis not present

## 2016-03-07 DIAGNOSIS — C9000 Multiple myeloma not having achieved remission: Secondary | ICD-10-CM | POA: Diagnosis not present

## 2016-03-07 DIAGNOSIS — Z95828 Presence of other vascular implants and grafts: Secondary | ICD-10-CM | POA: Diagnosis not present

## 2016-04-02 DIAGNOSIS — J449 Chronic obstructive pulmonary disease, unspecified: Secondary | ICD-10-CM | POA: Diagnosis not present

## 2016-04-05 DIAGNOSIS — C9001 Multiple myeloma in remission: Secondary | ICD-10-CM | POA: Diagnosis not present

## 2016-04-05 DIAGNOSIS — C9000 Multiple myeloma not having achieved remission: Secondary | ICD-10-CM | POA: Diagnosis not present

## 2016-04-05 DIAGNOSIS — Z95828 Presence of other vascular implants and grafts: Secondary | ICD-10-CM | POA: Diagnosis not present

## 2016-04-05 DIAGNOSIS — G893 Neoplasm related pain (acute) (chronic): Secondary | ICD-10-CM | POA: Diagnosis not present

## 2016-04-08 DIAGNOSIS — C9000 Multiple myeloma not having achieved remission: Secondary | ICD-10-CM | POA: Diagnosis not present

## 2016-04-08 DIAGNOSIS — E538 Deficiency of other specified B group vitamins: Secondary | ICD-10-CM | POA: Diagnosis not present

## 2016-04-08 DIAGNOSIS — R300 Dysuria: Secondary | ICD-10-CM | POA: Diagnosis not present

## 2016-04-14 ENCOUNTER — Other Ambulatory Visit (HOSPITAL_COMMUNITY): Payer: Self-pay | Admitting: Oncology

## 2016-04-14 ENCOUNTER — Encounter (HOSPITAL_COMMUNITY): Payer: Self-pay | Admitting: Oncology

## 2016-04-14 DIAGNOSIS — C9000 Multiple myeloma not having achieved remission: Secondary | ICD-10-CM

## 2016-04-14 HISTORY — DX: Multiple myeloma not having achieved remission: C90.00

## 2016-04-14 MED ORDER — LENALIDOMIDE 10 MG PO CAPS: 10.0000 mg | ORAL_CAPSULE | ORAL | Status: DC

## 2016-04-25 ENCOUNTER — Other Ambulatory Visit (HOSPITAL_COMMUNITY): Payer: Self-pay | Admitting: Oncology

## 2016-04-25 DIAGNOSIS — C9000 Multiple myeloma not having achieved remission: Secondary | ICD-10-CM

## 2016-04-25 MED ORDER — LENALIDOMIDE 10 MG PO CAPS: 10.0000 mg | ORAL_CAPSULE | ORAL | Status: DC

## 2016-05-02 DIAGNOSIS — J449 Chronic obstructive pulmonary disease, unspecified: Secondary | ICD-10-CM | POA: Diagnosis not present

## 2016-05-04 DIAGNOSIS — E119 Type 2 diabetes mellitus without complications: Secondary | ICD-10-CM | POA: Diagnosis not present

## 2016-05-04 DIAGNOSIS — E7800 Pure hypercholesterolemia, unspecified: Secondary | ICD-10-CM | POA: Diagnosis not present

## 2016-05-04 DIAGNOSIS — M810 Age-related osteoporosis without current pathological fracture: Secondary | ICD-10-CM | POA: Diagnosis not present

## 2016-05-04 DIAGNOSIS — Z79899 Other long term (current) drug therapy: Secondary | ICD-10-CM | POA: Diagnosis not present

## 2016-05-04 DIAGNOSIS — S32511A Fracture of superior rim of right pubis, initial encounter for closed fracture: Secondary | ICD-10-CM | POA: Diagnosis not present

## 2016-05-04 DIAGNOSIS — I10 Essential (primary) hypertension: Secondary | ICD-10-CM | POA: Diagnosis not present

## 2016-05-04 DIAGNOSIS — Z7984 Long term (current) use of oral hypoglycemic drugs: Secondary | ICD-10-CM | POA: Diagnosis not present

## 2016-05-04 DIAGNOSIS — M25511 Pain in right shoulder: Secondary | ICD-10-CM | POA: Diagnosis not present

## 2016-05-04 DIAGNOSIS — J449 Chronic obstructive pulmonary disease, unspecified: Secondary | ICD-10-CM | POA: Diagnosis not present

## 2016-05-04 DIAGNOSIS — C9000 Multiple myeloma not having achieved remission: Secondary | ICD-10-CM | POA: Diagnosis not present

## 2016-05-04 DIAGNOSIS — W19XXXA Unspecified fall, initial encounter: Secondary | ICD-10-CM | POA: Diagnosis not present

## 2016-05-04 DIAGNOSIS — S4991XA Unspecified injury of right shoulder and upper arm, initial encounter: Secondary | ICD-10-CM | POA: Diagnosis not present

## 2016-05-09 DIAGNOSIS — S32511A Fracture of superior rim of right pubis, initial encounter for closed fracture: Secondary | ICD-10-CM | POA: Diagnosis not present

## 2016-05-23 DIAGNOSIS — B079 Viral wart, unspecified: Secondary | ICD-10-CM | POA: Diagnosis not present

## 2016-05-23 DIAGNOSIS — S32511D Fracture of superior rim of right pubis, subsequent encounter for fracture with routine healing: Secondary | ICD-10-CM | POA: Diagnosis not present

## 2016-06-02 DIAGNOSIS — J449 Chronic obstructive pulmonary disease, unspecified: Secondary | ICD-10-CM | POA: Diagnosis not present

## 2016-06-03 ENCOUNTER — Encounter (HOSPITAL_COMMUNITY): Payer: Self-pay

## 2016-06-03 ENCOUNTER — Encounter (HOSPITAL_COMMUNITY): Payer: Medicare Other | Attending: Hematology & Oncology

## 2016-06-03 DIAGNOSIS — Z452 Encounter for adjustment and management of vascular access device: Secondary | ICD-10-CM | POA: Diagnosis not present

## 2016-06-03 DIAGNOSIS — D630 Anemia in neoplastic disease: Secondary | ICD-10-CM | POA: Diagnosis not present

## 2016-06-03 DIAGNOSIS — E1165 Type 2 diabetes mellitus with hyperglycemia: Secondary | ICD-10-CM | POA: Diagnosis not present

## 2016-06-03 DIAGNOSIS — C9000 Multiple myeloma not having achieved remission: Secondary | ICD-10-CM

## 2016-06-03 DIAGNOSIS — E559 Vitamin D deficiency, unspecified: Secondary | ICD-10-CM | POA: Diagnosis not present

## 2016-06-03 DIAGNOSIS — S32511D Fracture of superior rim of right pubis, subsequent encounter for fracture with routine healing: Secondary | ICD-10-CM | POA: Diagnosis not present

## 2016-06-03 DIAGNOSIS — E1122 Type 2 diabetes mellitus with diabetic chronic kidney disease: Secondary | ICD-10-CM | POA: Diagnosis not present

## 2016-06-03 DIAGNOSIS — I10 Essential (primary) hypertension: Secondary | ICD-10-CM | POA: Diagnosis not present

## 2016-06-03 MED ORDER — SODIUM CHLORIDE 0.9% FLUSH
10.0000 mL | INTRAVENOUS | Status: DC | PRN
  Administered 2016-06-03: 10 mL via INTRAVENOUS
  Filled 2016-06-03: qty 10

## 2016-06-03 MED ORDER — HEPARIN SOD (PORK) LOCK FLUSH 100 UNIT/ML IV SOLN
500.0000 [IU] | Freq: Once | INTRAVENOUS | Status: AC
  Administered 2016-06-03: 500 [IU] via INTRAVENOUS
  Filled 2016-06-03: qty 5

## 2016-06-03 NOTE — Patient Instructions (Signed)
[place] at [facility] Discharge Instructions  RECOMMENDATIONS MADE BY THE CONSULTANT AND ANY TEST RESULTS WILL BE SENT TO YOUR REFERRING PHYSICIAN.  Port flush today as scheduled. Follow up as scheduled Call for any concerns or questions  Thank you for choosing [place] at [facility] to provide your oncology and hematology care.  To afford each patient quality time with our provider, please arrive at least 15 minutes before your scheduled appointment time.   Beginning January 23rd 2017 lab work for the [facility] will be done in the  Main lab at [facility] on 1st floor. If you have a lab appointment with the [facility] please come in thru the  Main Entrance and check in at the main information desk  You need to re-schedule your appointment should you arrive 10 or more minutes late.  We strive to give you quality time with our providers, and arriving late affects you and other patients whose appointments are after yours.  Also, if you no show three or more times for appointments you may be dismissed from the clinic at the providers discretion.     Again, thank you for choosing [facility].  Our hope is that these requests will decrease the amount of time that you wait before being seen by our physicians.       _____________________________________________________________  Should you have questions after your visit to [facility], please contact our office at [phone] between the hours of 8:30 a.m. and 4:30 p.m.  Voicemails left after 4:30 p.m. will not be returned until the following business day.  For prescription refill requests, have your pharmacy contact our office.         Resources For Cancer Patients and their Caregivers ? American Cancer Society: Can assist with transportation, wigs, general needs, runs Look Good Feel Better.        636-655-6601 ? Cancer Care: Provides financial  assistance, online support groups, medication/co-pay assistance.  1-800-813-HOPE 640-016-6737) ? [facility] Assists [place] cancer patients and their families through emotional , educational and financial support.  808-777-4400 ? [place] DSS Where to apply for food stamps, Medicaid and utility assistance. 737-223-6119 ? [facility]: Transportation to medical appointments. 8106789439 ? Social Security Administration: May apply for disability if have a Stage IV cancer. 785-327-1369 385-463-8495 ? [employer], Disability and Transit Services: Assists with nutrition, care and transit needs. [place] Support Programs: @10RELATIVEDAYS @ > Cancer Support Group  2nd Tuesday of the month 1pm-2pm, Journey Room  > Creative Journey  3rd Tuesday of the month 1130am-1pm, Journey Room  > Look Good Feel Better  1st Wednesday of the month 10am-12 noon, Journey Room (Call [place] to register [phone])

## 2016-06-04 ENCOUNTER — Encounter (HOSPITAL_COMMUNITY): Payer: Self-pay

## 2016-06-05 DIAGNOSIS — E1165 Type 2 diabetes mellitus with hyperglycemia: Secondary | ICD-10-CM | POA: Diagnosis not present

## 2016-06-05 DIAGNOSIS — E1169 Type 2 diabetes mellitus with other specified complication: Secondary | ICD-10-CM | POA: Diagnosis not present

## 2016-06-05 DIAGNOSIS — E1122 Type 2 diabetes mellitus with diabetic chronic kidney disease: Secondary | ICD-10-CM | POA: Diagnosis not present

## 2016-06-05 DIAGNOSIS — I10 Essential (primary) hypertension: Secondary | ICD-10-CM | POA: Diagnosis not present

## 2016-06-05 DIAGNOSIS — E871 Hypo-osmolality and hyponatremia: Secondary | ICD-10-CM | POA: Diagnosis not present

## 2016-06-05 DIAGNOSIS — B079 Viral wart, unspecified: Secondary | ICD-10-CM | POA: Diagnosis not present

## 2016-06-10 ENCOUNTER — Other Ambulatory Visit (HOSPITAL_COMMUNITY): Payer: Self-pay

## 2016-06-10 DIAGNOSIS — C9000 Multiple myeloma not having achieved remission: Secondary | ICD-10-CM

## 2016-06-10 DIAGNOSIS — F411 Generalized anxiety disorder: Secondary | ICD-10-CM

## 2016-06-10 MED ORDER — ALPRAZOLAM 1 MG PO TABS: 1.0000 mg | ORAL_TABLET | Freq: Two times a day (BID) | ORAL | 3 refills | Status: DC

## 2016-06-20 ENCOUNTER — Other Ambulatory Visit (HOSPITAL_COMMUNITY): Payer: Self-pay | Admitting: Oncology

## 2016-06-20 DIAGNOSIS — C9000 Multiple myeloma not having achieved remission: Secondary | ICD-10-CM

## 2016-06-20 MED ORDER — LENALIDOMIDE 10 MG PO CAPS: 10.0000 mg | ORAL_CAPSULE | ORAL | 1 refills | Status: DC

## 2016-07-01 DIAGNOSIS — S32511A Fracture of superior rim of right pubis, initial encounter for closed fracture: Secondary | ICD-10-CM | POA: Diagnosis not present

## 2016-07-03 DIAGNOSIS — J449 Chronic obstructive pulmonary disease, unspecified: Secondary | ICD-10-CM | POA: Diagnosis not present

## 2016-07-10 ENCOUNTER — Other Ambulatory Visit (HOSPITAL_COMMUNITY): Payer: Self-pay

## 2016-07-10 ENCOUNTER — Ambulatory Visit (HOSPITAL_COMMUNITY): Payer: Self-pay | Admitting: Hematology & Oncology

## 2016-07-10 ENCOUNTER — Ambulatory Visit (HOSPITAL_COMMUNITY): Payer: Self-pay

## 2016-07-15 ENCOUNTER — Encounter (HOSPITAL_COMMUNITY): Payer: Self-pay | Admitting: Hematology & Oncology

## 2016-07-15 ENCOUNTER — Encounter (HOSPITAL_BASED_OUTPATIENT_CLINIC_OR_DEPARTMENT_OTHER): Payer: Medicare Other

## 2016-07-15 ENCOUNTER — Encounter (HOSPITAL_COMMUNITY): Payer: Medicare Other | Attending: Hematology & Oncology | Admitting: Hematology & Oncology

## 2016-07-15 ENCOUNTER — Encounter (HOSPITAL_COMMUNITY): Payer: Medicare Other

## 2016-07-15 ENCOUNTER — Other Ambulatory Visit (HOSPITAL_COMMUNITY): Payer: Self-pay | Admitting: Oncology

## 2016-07-15 VITALS — BP 103/63 | HR 77 | Temp 98.0°F | Resp 18

## 2016-07-15 VITALS — Ht <= 58 in | Wt 126.8 lb

## 2016-07-15 DIAGNOSIS — F419 Anxiety disorder, unspecified: Secondary | ICD-10-CM | POA: Diagnosis not present

## 2016-07-15 DIAGNOSIS — C9000 Multiple myeloma not having achieved remission: Secondary | ICD-10-CM

## 2016-07-15 DIAGNOSIS — G893 Neoplasm related pain (acute) (chronic): Secondary | ICD-10-CM

## 2016-07-15 DIAGNOSIS — Z87891 Personal history of nicotine dependence: Secondary | ICD-10-CM | POA: Insufficient documentation

## 2016-07-15 DIAGNOSIS — F411 Generalized anxiety disorder: Secondary | ICD-10-CM

## 2016-07-15 DIAGNOSIS — E538 Deficiency of other specified B group vitamins: Secondary | ICD-10-CM | POA: Diagnosis not present

## 2016-07-15 DIAGNOSIS — J449 Chronic obstructive pulmonary disease, unspecified: Secondary | ICD-10-CM

## 2016-07-15 DIAGNOSIS — Z23 Encounter for immunization: Secondary | ICD-10-CM | POA: Diagnosis not present

## 2016-07-15 LAB — CBC WITH DIFFERENTIAL/PLATELET
BASOS ABS: 0.1 10*3/uL (ref 0.0–0.1)
BASOS PCT: 1 %
Eosinophils Absolute: 0.1 10*3/uL (ref 0.0–0.7)
Eosinophils Relative: 2 %
HEMATOCRIT: 37 % (ref 36.0–46.0)
Hemoglobin: 12 g/dL (ref 12.0–15.0)
Lymphocytes Relative: 12 %
Lymphs Abs: 0.6 10*3/uL — ABNORMAL LOW (ref 0.7–4.0)
MCH: 29.7 pg (ref 26.0–34.0)
MCHC: 32.4 g/dL (ref 30.0–36.0)
MCV: 91.6 fL (ref 78.0–100.0)
MONO ABS: 0.4 10*3/uL (ref 0.1–1.0)
Monocytes Relative: 8 %
NEUTROS ABS: 4.1 10*3/uL (ref 1.7–7.7)
Neutrophils Relative %: 77 %
PLATELETS: 125 10*3/uL — AB (ref 150–400)
RBC: 4.04 MIL/uL (ref 3.87–5.11)
RDW: 15.4 % (ref 11.5–15.5)
WBC: 5.2 10*3/uL (ref 4.0–10.5)

## 2016-07-15 LAB — COMPREHENSIVE METABOLIC PANEL
ALBUMIN: 4.1 g/dL (ref 3.5–5.0)
ALK PHOS: 54 U/L (ref 38–126)
ALT: 23 U/L (ref 14–54)
ANION GAP: 12 (ref 5–15)
AST: 24 U/L (ref 15–41)
BUN: 18 mg/dL (ref 6–20)
CALCIUM: 9.3 mg/dL (ref 8.9–10.3)
CO2: 29 mmol/L (ref 22–32)
Chloride: 97 mmol/L — ABNORMAL LOW (ref 101–111)
Creatinine, Ser: 1.23 mg/dL — ABNORMAL HIGH (ref 0.44–1.00)
GFR calc non Af Amer: 41 mL/min — ABNORMAL LOW (ref >60)
GFR, EST AFRICAN AMERICAN: 47 mL/min — AB (ref >60)
GLUCOSE: 183 mg/dL — AB (ref 65–99)
POTASSIUM: 3.3 mmol/L — AB (ref 3.5–5.1)
SODIUM: 138 mmol/L (ref 135–145)
Total Bilirubin: 0.7 mg/dL (ref 0.3–1.2)
Total Protein: 7.2 g/dL (ref 6.5–8.1)

## 2016-07-15 LAB — IRON AND TIBC
Iron: 43 ug/dL (ref 28–170)
SATURATION RATIOS: 18 % (ref 10.4–31.8)
TIBC: 242 ug/dL — AB (ref 250–450)
UIBC: 199 ug/dL

## 2016-07-15 LAB — RETICULOCYTES
RBC.: 4.04 MIL/uL (ref 3.87–5.11)
RETIC CT PCT: 2.7 % (ref 0.4–3.1)
Retic Count, Absolute: 109.1 10*3/uL (ref 19.0–186.0)

## 2016-07-15 LAB — FERRITIN: Ferritin: 677 ng/mL — ABNORMAL HIGH (ref 11–307)

## 2016-07-15 LAB — SEDIMENTATION RATE: SED RATE: 30 mm/h — AB (ref 0–22)

## 2016-07-15 LAB — C-REACTIVE PROTEIN: CRP: 1.2 mg/dL — AB (ref <1.0)

## 2016-07-15 LAB — LACTATE DEHYDROGENASE: LDH: 138 U/L (ref 98–192)

## 2016-07-15 LAB — VITAMIN B12: VITAMIN B 12: 374 pg/mL (ref 180–914)

## 2016-07-15 LAB — FOLATE: FOLATE: 23 ng/mL (ref >5.9)

## 2016-07-15 MED ORDER — SODIUM CHLORIDE 0.9 % IV SOLN
Freq: Once | INTRAVENOUS | Status: AC
  Administered 2016-07-15: 14:00:00 via INTRAVENOUS

## 2016-07-15 MED ORDER — HYDROCODONE-ACETAMINOPHEN 10-325 MG PO TABS: 1.0000 | ORAL_TABLET | ORAL | 0 refills | Status: DC | PRN

## 2016-07-15 MED ORDER — ALPRAZOLAM 1 MG PO TABS: 1.0000 mg | ORAL_TABLET | Freq: Two times a day (BID) | ORAL | 3 refills | Status: DC

## 2016-07-15 MED ORDER — ZOLEDRONIC ACID 4 MG/5ML IV CONC
3.3000 mg | Freq: Once | INTRAVENOUS | Status: AC
  Administered 2016-07-15: 3.3 mg via INTRAVENOUS
  Filled 2016-07-15: qty 4.13

## 2016-07-15 MED ORDER — HEPARIN SOD (PORK) LOCK FLUSH 100 UNIT/ML IV SOLN
500.0000 [IU] | Freq: Once | INTRAVENOUS | Status: AC
  Administered 2016-07-15: 500 [IU] via INTRAVENOUS

## 2016-07-15 MED ORDER — HEPARIN SOD (PORK) LOCK FLUSH 100 UNIT/ML IV SOLN
INTRAVENOUS | Status: AC
  Filled 2016-07-15: qty 10

## 2016-07-15 MED ORDER — INFLUENZA VAC SPLIT QUAD 0.5 ML IM SUSY
0.5000 mL | PREFILLED_SYRINGE | Freq: Once | INTRAMUSCULAR | Status: AC
  Administered 2016-07-15: 0.5 mL via INTRAMUSCULAR
  Filled 2016-07-15: qty 0.5

## 2016-07-15 NOTE — Progress Notes (Signed)
[place]  CONSULT NOTE  No care team member to display  CHIEF COMPLAINTS/PURPOSE OF CONSULTATION:  IgG Multiple myeloma Revlimid 10 mg po qod Zometa Q 3 months Transfer from [facility]  HISTORY OF PRESENTING ILLNESS:  Brooke Keller 81 y.o. female is here because of referral from [facility] to establish care of multiple myeloma.  Brooke Keller is a pleasant 81 y.o. with IgG multiple myeloma who is currently on maintenance Revlimid 10 mg by mouth every other day with dose reduction secondary to cytopenias. She receives Zometa every 3 months. She was last seen by Brooke Keller on 04/05/2016.  Brooke Keller is accompanied by her daughter and presents in wheelchair. I personally reviewed and went over laboratory studies with the patient.  She cannot see well. She is unable to cook because she cannot see well enough. She believes her vision change is from glaucoma.   She is curious what caused her myeloma. She takes Revlimid every other day and does well with this. She receives Zometa every few months. She is not sure how long she has been on Zometa but states "It's about time I come off of it". Her medicine is delivered to her house. She is not sure if she needs a refill of Revlimid, her other daughter handles this.  She reports falling about 3 months ago, "on my hind end without much padding back there". She fractured her pelvis and this has healed pretty good. She is able to get around okay now.  She is able to get dress on her own but her husband helps her. He has has had multiple surgeries, "But he waits on me pretty good". Several of her children live in close proximity to her house.  She requested a refill on Xanax. Brooke Keller used to fill this for her. Her last prescription was filled by her PCP until she could be seen in our clinic. She takes the blue ones twice a day and has been for about three or four years. This medication "levels her out" it helps her sleep at night. "I need  my xanax right away because I don't have none for tonight".  She is on her last refill of pain medication.  She takes a potassium supplement daily.   Her daughter places all of her medications in a pill box for her.   She misses Brooke Keller, "He was so nice". She had seen him for about 6 years.   She sees PCP, Brooke Keller.  She has never had a mammogram.  She denies chest pain or breathing issues. She reports a good appetite sometimes and sometimes not so good.   The patient is here to establish care and surveillance of multiple myeloma.      MEDICAL HISTORY:  Past Medical History:  Diagnosis Date  . COPD (chronic obstructive pulmonary disease) ([place])   . Diabetes mellitus   . GERD (gastroesophageal reflux disease)   . Hypertension   . IgG multiple myeloma ([place]) 04/14/2016  . Multiple myeloma     SURGICAL HISTORY: Past Surgical History:  Procedure Laterality Date  . carpal tunne    . right fifth toe      SOCIAL HISTORY: Social History   Social History  . Marital status: Married    Spouse name: Brooke Keller  . Number of children: N/A  . Years of education: N/A   Occupational History  . Not on file.   Social History Main Topics  . Smoking status: Former Smoker  Quit date: 09/30/2007  . Smokeless tobacco: Former Systems developer    Quit date: 09/30/2006  . Alcohol use No  . Drug use: No  . Sexual activity: Yes    Partners: Female   Other Topics Concern  . Not on file   Social History Narrative  . 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 No pertinent family history.  Mother deceased 71 yo. Believes she had Alzheimer's Father deceased 80 yo had congestive heart failure. 11  siblings. 6 have passed. She doesn't know of any with cancer. She is not aware of any family history of myeloma.  ALLERGIES:  is allergic to ativan [lorazepam].  MEDICATIONS:  Current Outpatient Prescriptions  Medication Sig Dispense Refill  . acyclovir (ZOVIRAX) 800 MG tablet Take 800 mg by mouth 2 (two) times daily.    Marland Kitchen ALPRAZolam (XANAX) 1 MG tablet Take 1 tablet (1 mg total) by mouth 2 (two) times daily. 60 tablet 3  . aspirin EC 81 MG tablet Take 81 mg by mouth every morning.     Marland Kitchen atorvastatin (LIPITOR) 10 MG tablet Take 10 mg by mouth every morning.     . brimonidine (ALPHAGAN) 0.2 % ophthalmic solution INSTILL ONE DROP IN EACH EYE THREE TIMES DAILY  99  . calcium-vitamin D (OSCAL WITH D) 500-200 MG-UNIT per tablet Take 1 tablet by mouth 2 (two) times daily.    . dorzolamide-timolol (COSOPT) 22.3-6.8 MG/ML ophthalmic solution Place 1 drop into both eyes 2 (two) times daily.  99  . furosemide (LASIX) 40 MG tablet TAKE ONE TABLET BY MOUTH IN THE MORNING, TAKE 1/2 TABLET IN THE AFTERNOON AND TAKE 1/2 TABLET AT NIGHT  3  . HYDROcodone-acetaminophen (NORCO) 10-325 MG tablet Take 1 tablet by mouth every 4 (four) hours as needed. for pain 90 tablet 0  . ipratropium-albuterol (DUONEB) 0.5-2.5 (3) MG/3ML SOLN Take 3 mLs by nebulization 2 (two) times daily.      Marland Kitchen lenalidomide (REVLIMID) 10 MG capsule Take 1 capsule (10 mg total) by mouth every other day. 14 capsule 1  . losartan (COZAAR) 50 MG tablet Take 50 mg by mouth every morning.     Marland Kitchen LUMIGAN 0.01 % SOLN INSTILL ONE DROP IN EACH EYE AT BEDTIME  99  . metFORMIN (GLUCOPHAGE) 1000 MG tablet Take 500 mg by mouth daily with breakfast.     . nystatin cream (MYCOSTATIN) APPLY TO THE AFFECTED AREA AS NEEDED.  1  . PERFOROMIST 20 MCG/2ML nebulizer solution Inhale 20 mcg into the lungs 2 (two) times daily.     . potassium chloride SA (K-DUR,KLOR-CON) 20 MEQ tablet Take 20 mEq by mouth 2 (two) times daily.  2  . predniSONE (DELTASONE) 10 MG tablet  Take 10 mg by mouth 2 (two) times daily.     No current facility-administered medications for this visit.     Review of Systems  Constitutional: Negative.   HENT: Negative.   Eyes: Negative.        Poor vision  Respiratory: Negative.   Cardiovascular: Negative.   Gastrointestinal: Negative.   Genitourinary: Negative.   Musculoskeletal: Negative.   Skin: Negative.   Neurological: Negative.   Endo/Heme/Allergies: Negative.   Psychiatric/Behavioral: Negative.  All other systems reviewed and are negative. 14 point ROS was done and is otherwise as detailed above or in HPI   PHYSICAL EXAMINATION: ECOG PERFORMANCE STATUS: 2 - Symptomatic, <50% confined to bed  There were no vitals filed for this visit. Filed Weights   07/15/16 1501  Weight: 126 lb 12.8 oz (57.5 kg)   Physical Exam  Constitutional: She is oriented to person, place, and time and well-developed, well-nourished, and in no distress. No distress.  Kyphosis, petite  HENT:  Head: Normocephalic and atraumatic.  Mouth/Throat: Oropharynx is clear and moist.  Eyes: Conjunctivae and EOM are normal. Pupils are equal, round, and reactive to light. Right eye exhibits no discharge. Left eye exhibits no discharge. No scleral icterus.  Neck: Normal range of motion. Neck supple.  Cardiovascular: Normal rate, regular rhythm and normal heart sounds.   Pulmonary/Chest: Effort normal and breath sounds normal. No respiratory distress. She has no wheezes. She has no rales. She exhibits no tenderness.  Abdominal: Soft. Bowel sounds are normal. She exhibits no distension and no mass. There is no tenderness. There is no rebound and no guarding.  Musculoskeletal: Normal range of motion. She exhibits no edema.  Neurological: She is alert and oriented to person, place, and time. No cranial nerve deficit. Gait normal.  Skin: Skin is warm and dry. No rash noted. No erythema. No pallor.  Psychiatric: Mood, memory, affect and judgment normal.    Nursing note and vitals reviewed.   LABORATORY DATA:  I have reviewed the data as listed Lab Results  Component Value Date   WBC 5.2 07/15/2016   HGB 12.0 07/15/2016   HCT 37.0 07/15/2016   MCV 91.6 07/15/2016   PLT 125 (L) 07/15/2016   CMP     Component Value Date/Time   NA 138 07/15/2016 1306   K 3.3 (L) 07/15/2016 1306   CL 97 (L) 07/15/2016 1306   CO2 29 07/15/2016 1306   GLUCOSE 183 (H) 07/15/2016 1306   BUN 18 07/15/2016 1306   CREATININE 1.23 (H) 07/15/2016 1306   CALCIUM 9.3 07/15/2016 1306   PROT 7.2 07/15/2016 1306   ALBUMIN 4.1 07/15/2016 1306   AST 24 07/15/2016 1306   ALT 23 07/15/2016 1306   ALKPHOS 54 07/15/2016 1306   BILITOT 0.7 07/15/2016 1306   GFRNONAA 41 (L) 07/15/2016 1306   GFRAA 47 (L) 07/15/2016 1306         RADIOGRAPHIC STUDIES: I have personally reviewed the radiological images as listed and agreed with the findings in the report. No results found.  ASSESSMENT & PLAN:  IgG Multiple myeloma COPD HX fractures Anxiety on xanax Hx renal failure Hx B12 deficiency  Cancer related pain  Labs today are available and were reviewed with the patient. They are included within this note. I also reviewed her last labs from [facility]. She had no evidence of an M-spike on SPEP/IEP in June. Will continue to follow myeloma labs moving forward.   Last visit with Brooke Keller on 04/05/2016, notes were reviewed.   She takes Revlimid every other day. She takes one potassium supplement daily. She will continue with revlimid as prescribed.   The patient is agreeable to spacing out her Zometa to every 4 months. She received Zometa today. She will be due for her next Zometa in February 2018. She has been on zometa since 2012.   She received a flu shot today.   I have written the patient a prescription for hydrocodone and Xanax. She has been on  these medications for many years. She notes that she has chronic anxiety and xanax works well for her.   B12 will  be checked at follow-up given her history of B12 deficiency.   She will return for follow up in 3 months.   ORDERS PLACED FOR THIS ENCOUNTER: Orders Placed This Encounter  Procedures  . CBC with Differential  . Comprehensive metabolic panel  . Protein electrophoresis, serum  . Immunofixation electrophoresis  . Kappa/lambda light chains    MEDICATIONS PRESCRIBED THIS ENCOUNTER: Meds ordered this encounter  Medications  . ALPRAZolam (XANAX) 1 MG tablet    Sig: Take 1 tablet (1 mg total) by mouth 2 (two) times daily.    Dispense:  60 tablet    Refill:  3  . HYDROcodone-acetaminophen (NORCO) 10-325 MG tablet    Sig: Take 1 tablet by mouth every 4 (four) hours as needed. for pain    Dispense:  90 tablet    Refill:  0    All questions were answered. The patient knows to call the clinic with any problems, questions or concerns.  This document serves as a record of services personally performed by Ancil Linsey, MD. It was created on her behalf by Arlyce Harman, a trained medical scribe. The creation of this record is based on the scribe's personal observations and the provider's statements to them. This document has been checked and approved by the attending provider.  I have reviewed the above documentation for accuracy and completeness and I agree with the above.  This note was electronically signed.    Molli Hazard, MD  07/16/2016 5:34 PM

## 2016-07-15 NOTE — Patient Instructions (Signed)
[place] at [facility] Discharge Instructions  RECOMMENDATIONS MADE BY THE CONSULTANT AND ANY TEST RESULTS WILL BE SENT TO YOUR REFERRING PHYSICIAN.  Zometa infusion today. Office visit with Dr. Whitney Muse today. Return as scheduled for lab work and office visit.  Return as scheduled for Zometa infusions.   Thank you for choosing [place] at [facility] to provide your oncology and hematology care.  To afford each patient quality time with our provider, please arrive at least 15 minutes before your scheduled appointment time.   Beginning January 23rd 2017 lab work for the [facility] will be done in the  Main lab at [facility] on 1st floor. If you have a lab appointment with the [place] please come in thru the  Main Entrance and check in at the main information desk  You need to re-schedule your appointment should you arrive 10 or more minutes late.  We strive to give you quality time with our providers, and arriving late affects you and other patients whose appointments are after yours.  Also, if you no show three or more times for appointments you may be dismissed from the clinic at the providers discretion.     Again, thank you for choosing [facility].  Our hope is that these requests will decrease the amount of time that you wait before being seen by our physicians.       _____________________________________________________________  Should you have questions after your visit to [facility], please contact our office at [phone] between the hours of 8:30 a.m. and 4:30 p.m.  Voicemails left after 4:30 p.m. will not be returned until the following business day.  For prescription refill requests, have your pharmacy contact our office.         Resources For Cancer Patients and their Caregivers ? American Cancer Society: Can assist with transportation, wigs, general needs, runs Look Good Feel Better.         6261101570 ? Cancer Care: Provides financial assistance, online support groups, medication/co-pay assistance.  1-800-813-HOPE (820)215-9453) ? [facility] Assists [place] cancer patients and their families through emotional , educational and financial support.  (612)241-7004 ? [place] DSS Where to apply for food stamps, Medicaid and utility assistance. 765-299-7124 ? RCATS: Transportation to medical appointments. 360-342-6120 ? Social Security Administration: May apply for disability if have a Stage IV cancer. (912) 317-8681 (207) 358-8253 ? [employer], Disability and Transit Services: Assists with nutrition, care and transit needs. [place] Support Programs: @10RELATIVEDAYS @ > Cancer Support Group  2nd Tuesday of the month 1pm-2pm, Journey Room  > Creative Journey  3rd Tuesday of the month 1130am-1pm, Journey Room  > Look Good Feel Better  1st Wednesday of the month 10am-12 noon, Journey Room (Call [place] to register [phone])

## 2016-07-15 NOTE — Progress Notes (Signed)
Patient called and message left for her to call us back about her potassium.

## 2016-07-15 NOTE — Progress Notes (Signed)
Tolerated infusion w/o adverse reaction.  Alert, in no distress.  Discharged via wheelchair in c/o family.  

## 2016-07-15 NOTE — Patient Instructions (Addendum)
[place] at [facility] Discharge Instructions  RECOMMENDATIONS MADE BY THE CONSULTANT AND ANY TEST RESULTS WILL BE SENT TO YOUR REFERRING PHYSICIAN.  You saw Dr. Whitney Muse today. Follow up in 3 months with labs. Zometa in 4 months.  Thank you for choosing [place] at [facility] to provide your oncology and hematology care.  To afford each patient quality time with our provider, please arrive at least 15 minutes before your scheduled appointment time.   Beginning January 23rd 2017 lab work for the [facility] will be done in the  Main lab at [facility] on 1st floor. If you have a lab appointment with the [facility] please come in thru the  Main Entrance and check in at the main information desk  You need to re-schedule your appointment should you arrive 10 or more minutes late.  We strive to give you quality time with our providers, and arriving late affects you and other patients whose appointments are after yours.  Also, if you no show three or more times for appointments you may be dismissed from the clinic at the providers discretion.     Again, thank you for choosing [facility].  Our hope is that these requests will decrease the amount of time that you wait before being seen by our physicians.       _____________________________________________________________  Should you have questions after your visit to [facility], please contact our office at [phone] between the hours of 8:30 a.m. and 4:30 p.m.  Voicemails left after 4:30 p.m. will not be returned until the following business day.  For prescription refill requests, have your pharmacy contact our office.         Resources For Cancer Patients and their Caregivers ? American Cancer Society: Can assist with transportation, wigs, general needs, runs Look Good Feel Better.        (414)702-8588 ? Cancer Care: Provides financial assistance,  online support groups, medication/co-pay assistance.  1-800-813-HOPE 720-550-8579) ? [facility] Assists [place] cancer patients and their families through emotional , educational and financial support.  9312130685 ? [place] DSS Where to apply for food stamps, Medicaid and utility assistance. 9392034639 ? RCATS: Transportation to medical appointments. 810-332-5543 ? Social Security Administration: May apply for disability if have a Stage IV cancer. 409-716-0219 616-231-8785 ? [employer], Disability and Transit Services: Assists with nutrition, care and transit needs. [place] Support Programs: @10RELATIVEDAYS @ > Cancer Support Group  2nd Tuesday of the month 1pm-2pm, Journey Room  > Creative Journey  3rd Tuesday of the month 1130am-1pm, Journey Room  > Look Good Feel Better  1st Wednesday of the month 10am-12 noon, Journey Room (Call [place] to register [phone])

## 2016-07-16 ENCOUNTER — Encounter (HOSPITAL_COMMUNITY): Payer: Self-pay | Admitting: Hematology & Oncology

## 2016-07-16 ENCOUNTER — Other Ambulatory Visit (HOSPITAL_COMMUNITY): Payer: Self-pay | Admitting: Oncology

## 2016-07-16 DIAGNOSIS — C9000 Multiple myeloma not having achieved remission: Secondary | ICD-10-CM

## 2016-07-16 LAB — IGG, IGA, IGM
IgA: 244 mg/dL (ref 64–422)
IgG (Immunoglobin G), Serum: 960 mg/dL (ref 700–1600)
IgM, Serum: 48 mg/dL (ref 26–217)

## 2016-07-16 LAB — PROTEIN ELECTROPHORESIS, SERUM
A/G RATIO SPE: 1.3 (ref 0.7–1.7)
ALBUMIN ELP: 3.9 g/dL (ref 2.9–4.4)
ALPHA-1-GLOBULIN: 0.2 g/dL (ref 0.0–0.4)
Alpha-2-Globulin: 0.8 g/dL (ref 0.4–1.0)
Beta Globulin: 0.9 g/dL (ref 0.7–1.3)
GLOBULIN, TOTAL: 2.9 g/dL (ref 2.2–3.9)
Gamma Globulin: 1 g/dL (ref 0.4–1.8)
TOTAL PROTEIN ELP: 6.8 g/dL (ref 6.0–8.5)

## 2016-07-16 LAB — KAPPA/LAMBDA LIGHT CHAINS
KAPPA FREE LGHT CHN: 44 mg/L — AB (ref 3.3–19.4)
Kappa, lambda light chain ratio: 1.19 (ref 0.26–1.65)
Lambda free light chains: 37 mg/L — ABNORMAL HIGH (ref 5.7–26.3)

## 2016-07-16 LAB — BETA 2 MICROGLOBULIN, SERUM: Beta-2 Microglobulin: 3.2 mg/L — ABNORMAL HIGH (ref 0.6–2.4)

## 2016-07-16 MED ORDER — LENALIDOMIDE 10 MG PO CAPS: 10.0000 mg | ORAL_CAPSULE | ORAL | 1 refills | Status: DC

## 2016-07-17 ENCOUNTER — Telehealth (HOSPITAL_COMMUNITY): Payer: Self-pay | Admitting: Oncology

## 2016-07-17 LAB — IMMUNOFIXATION ELECTROPHORESIS
IGA: 239 mg/dL (ref 64–422)
IGG (IMMUNOGLOBIN G), SERUM: 988 mg/dL (ref 700–1600)
IGM, SERUM: 54 mg/dL (ref 26–217)
TOTAL PROTEIN ELP: 6.8 g/dL (ref 6.0–8.5)

## 2016-07-17 NOTE — Telephone Encounter (Signed)
Faxed rev script to [facility]

## 2016-07-18 ENCOUNTER — Telehealth (HOSPITAL_COMMUNITY): Payer: Self-pay | Admitting: *Deleted

## 2016-07-18 NOTE — Telephone Encounter (Signed)
Pt aware of labs. Pt states that she is taking the potassium once a day, 42meq.

## 2016-07-18 NOTE — Telephone Encounter (Signed)
-----   Message from [facility], PA-C sent at 07/15/2016  3:53 PM EDT ----- Some labs are pending.  Otherwise stable.  Low K+ is noted.  Is she taking K+?  If so, how much and how often?

## 2016-08-02 DIAGNOSIS — J449 Chronic obstructive pulmonary disease, unspecified: Secondary | ICD-10-CM | POA: Diagnosis not present

## 2016-08-15 ENCOUNTER — Other Ambulatory Visit (HOSPITAL_COMMUNITY): Payer: Self-pay | Admitting: Oncology

## 2016-08-15 DIAGNOSIS — C9000 Multiple myeloma not having achieved remission: Secondary | ICD-10-CM

## 2016-08-15 MED ORDER — LENALIDOMIDE 10 MG PO CAPS: 10.0000 mg | ORAL_CAPSULE | ORAL | 1 refills | Status: DC

## 2016-08-29 ENCOUNTER — Telehealth (HOSPITAL_COMMUNITY): Payer: Self-pay | Admitting: *Deleted

## 2016-08-29 DIAGNOSIS — E119 Type 2 diabetes mellitus without complications: Secondary | ICD-10-CM | POA: Diagnosis not present

## 2016-08-29 DIAGNOSIS — J9600 Acute respiratory failure, unspecified whether with hypoxia or hypercapnia: Secondary | ICD-10-CM | POA: Diagnosis not present

## 2016-08-29 DIAGNOSIS — J9601 Acute respiratory failure with hypoxia: Secondary | ICD-10-CM | POA: Diagnosis not present

## 2016-08-29 DIAGNOSIS — J449 Chronic obstructive pulmonary disease, unspecified: Secondary | ICD-10-CM | POA: Diagnosis not present

## 2016-08-29 DIAGNOSIS — Z7952 Long term (current) use of systemic steroids: Secondary | ICD-10-CM | POA: Diagnosis not present

## 2016-08-29 DIAGNOSIS — Z7984 Long term (current) use of oral hypoglycemic drugs: Secondary | ICD-10-CM | POA: Diagnosis not present

## 2016-08-29 DIAGNOSIS — Z888 Allergy status to other drugs, medicaments and biological substances status: Secondary | ICD-10-CM | POA: Diagnosis not present

## 2016-08-29 DIAGNOSIS — Z87310 Personal history of (healed) osteoporosis fracture: Secondary | ICD-10-CM | POA: Diagnosis not present

## 2016-08-29 DIAGNOSIS — M8008XS Age-related osteoporosis with current pathological fracture, vertebra(e), sequela: Secondary | ICD-10-CM | POA: Diagnosis not present

## 2016-08-29 DIAGNOSIS — J441 Chronic obstructive pulmonary disease with (acute) exacerbation: Secondary | ICD-10-CM | POA: Diagnosis not present

## 2016-08-29 DIAGNOSIS — R0682 Tachypnea, not elsewhere classified: Secondary | ICD-10-CM | POA: Diagnosis not present

## 2016-08-29 DIAGNOSIS — Z79899 Other long term (current) drug therapy: Secondary | ICD-10-CM | POA: Diagnosis not present

## 2016-08-29 DIAGNOSIS — C9000 Multiple myeloma not having achieved remission: Secondary | ICD-10-CM | POA: Diagnosis not present

## 2016-08-29 DIAGNOSIS — C439 Malignant melanoma of skin, unspecified: Secondary | ICD-10-CM | POA: Diagnosis not present

## 2016-08-29 DIAGNOSIS — Z886 Allergy status to analgesic agent status: Secondary | ICD-10-CM | POA: Diagnosis not present

## 2016-08-29 DIAGNOSIS — I10 Essential (primary) hypertension: Secondary | ICD-10-CM | POA: Diagnosis not present

## 2016-08-30 ENCOUNTER — Other Ambulatory Visit (HOSPITAL_COMMUNITY): Payer: Self-pay | Admitting: Oncology

## 2016-08-30 DIAGNOSIS — R52 Pain, unspecified: Secondary | ICD-10-CM

## 2016-08-30 DIAGNOSIS — G893 Neoplasm related pain (acute) (chronic): Secondary | ICD-10-CM

## 2016-08-30 MED ORDER — HYDROCODONE-ACETAMINOPHEN 10-325 MG PO TABS: 1.0000 | ORAL_TABLET | ORAL | 0 refills | Status: DC | PRN

## 2016-09-01 DIAGNOSIS — R0602 Shortness of breath: Secondary | ICD-10-CM | POA: Diagnosis not present

## 2016-09-01 DIAGNOSIS — I509 Heart failure, unspecified: Secondary | ICD-10-CM | POA: Diagnosis not present

## 2016-09-01 DIAGNOSIS — Z886 Allergy status to analgesic agent status: Secondary | ICD-10-CM | POA: Diagnosis not present

## 2016-09-01 DIAGNOSIS — Z888 Allergy status to other drugs, medicaments and biological substances status: Secondary | ICD-10-CM | POA: Diagnosis not present

## 2016-09-01 DIAGNOSIS — R0902 Hypoxemia: Secondary | ICD-10-CM | POA: Diagnosis not present

## 2016-09-01 DIAGNOSIS — J441 Chronic obstructive pulmonary disease with (acute) exacerbation: Secondary | ICD-10-CM | POA: Diagnosis not present

## 2016-09-01 DIAGNOSIS — I110 Hypertensive heart disease with heart failure: Secondary | ICD-10-CM | POA: Diagnosis not present

## 2016-09-01 DIAGNOSIS — H409 Unspecified glaucoma: Secondary | ICD-10-CM | POA: Diagnosis not present

## 2016-09-01 DIAGNOSIS — Z87891 Personal history of nicotine dependence: Secondary | ICD-10-CM | POA: Diagnosis not present

## 2016-09-01 DIAGNOSIS — C9001 Multiple myeloma in remission: Secondary | ICD-10-CM | POA: Diagnosis not present

## 2016-09-01 DIAGNOSIS — Z7952 Long term (current) use of systemic steroids: Secondary | ICD-10-CM | POA: Diagnosis not present

## 2016-09-01 DIAGNOSIS — E119 Type 2 diabetes mellitus without complications: Secondary | ICD-10-CM | POA: Diagnosis not present

## 2016-09-01 DIAGNOSIS — Z79899 Other long term (current) drug therapy: Secondary | ICD-10-CM | POA: Diagnosis not present

## 2016-09-01 DIAGNOSIS — Z66 Do not resuscitate: Secondary | ICD-10-CM | POA: Diagnosis not present

## 2016-09-02 DIAGNOSIS — J449 Chronic obstructive pulmonary disease, unspecified: Secondary | ICD-10-CM | POA: Diagnosis not present

## 2016-09-02 DIAGNOSIS — J441 Chronic obstructive pulmonary disease with (acute) exacerbation: Secondary | ICD-10-CM | POA: Diagnosis not present

## 2016-09-03 DIAGNOSIS — J441 Chronic obstructive pulmonary disease with (acute) exacerbation: Secondary | ICD-10-CM | POA: Diagnosis not present

## 2016-09-04 DIAGNOSIS — J441 Chronic obstructive pulmonary disease with (acute) exacerbation: Secondary | ICD-10-CM | POA: Diagnosis not present

## 2016-09-10 ENCOUNTER — Encounter (HOSPITAL_COMMUNITY): Payer: Self-pay

## 2016-09-10 ENCOUNTER — Encounter (HOSPITAL_COMMUNITY): Payer: Medicare Other | Attending: Hematology & Oncology

## 2016-09-10 DIAGNOSIS — G893 Neoplasm related pain (acute) (chronic): Secondary | ICD-10-CM | POA: Insufficient documentation

## 2016-09-10 DIAGNOSIS — Z452 Encounter for adjustment and management of vascular access device: Secondary | ICD-10-CM | POA: Diagnosis not present

## 2016-09-10 DIAGNOSIS — C9000 Multiple myeloma not having achieved remission: Secondary | ICD-10-CM | POA: Diagnosis not present

## 2016-09-10 DIAGNOSIS — F419 Anxiety disorder, unspecified: Secondary | ICD-10-CM | POA: Insufficient documentation

## 2016-09-10 DIAGNOSIS — Z87891 Personal history of nicotine dependence: Secondary | ICD-10-CM | POA: Insufficient documentation

## 2016-09-10 DIAGNOSIS — J449 Chronic obstructive pulmonary disease, unspecified: Secondary | ICD-10-CM | POA: Insufficient documentation

## 2016-09-10 DIAGNOSIS — E538 Deficiency of other specified B group vitamins: Secondary | ICD-10-CM | POA: Insufficient documentation

## 2016-09-10 MED ORDER — HEPARIN SOD (PORK) LOCK FLUSH 100 UNIT/ML IV SOLN
500.0000 [IU] | Freq: Once | INTRAVENOUS | Status: AC
  Administered 2016-09-10: 500 [IU] via INTRAVENOUS
  Filled 2016-09-10: qty 5

## 2016-09-10 MED ORDER — SODIUM CHLORIDE 0.9% FLUSH
10.0000 mL | INTRAVENOUS | Status: DC | PRN
  Administered 2016-09-10: 10 mL via INTRAVENOUS
  Filled 2016-09-10: qty 10

## 2016-09-10 NOTE — Progress Notes (Signed)
Brooke Keller presented for Portacath access and flush. Portacath located right chest wall accessed with  H 20 needle. Good blood return present. Portacath flushed with 70ml NS and 500U/56ml Heparin and needle removed intact. Procedure without incident. Patient tolerated procedure well. Vitals stable and discharged home from clinic via wheelchair with daughter.

## 2016-09-10 NOTE — Patient Instructions (Signed)
[facility] at [facility] Discharge Instructions  RECOMMENDATIONS MADE BY THE CONSULTANT AND ANY TEST RESULTS WILL BE SENT TO YOUR REFERRING PHYSICIAN.  Port flush done today. Follow up as scheduled.  Thank you for choosing [facility] at [facility] to provide your oncology and hematology care.  To afford each patient quality time with our provider, please arrive at least 15 minutes before your scheduled appointment time.   Beginning January 23rd 2017 lab work for the Cancer Center will be done in the  Main lab at [facility] on 1st floor. If you have a lab appointment with the Cancer Center please come in thru the  Main Entrance and check in at the main information desk  You need to re-schedule your appointment should you arrive 10 or more minutes late.  We strive to give you quality time with our providers, and arriving late affects you and other patients whose appointments are after yours.  Also, if you no show three or more times for appointments you may be dismissed from the clinic at the providers discretion.     Again, thank you for choosing [facility].  Our hope is that these requests will decrease the amount of time that you wait before being seen by our physicians.       _____________________________________________________________  Should you have questions after your visit to [facility], please contact our office at [phone] between the hours of 8:30 a.m. and 4:30 p.m.  Voicemails left after 4:30 p.m. will not be returned until the following business day.  For prescription refill requests, have your pharmacy contact our office.         Resources For Cancer Patients and their Caregivers ? American Cancer Society: Can assist with transportation, wigs, general needs, runs Look Good Feel Better.        1-888-227-6333 ? Cancer Care: Provides financial assistance, online support groups,  medication/co-pay assistance.  1-800-813-HOPE (4673) ? [facility] Assists [place] cancer patients and their families through emotional , educational and financial support.  336-427-4357 ? [place] DSS Where to apply for food stamps, Medicaid and utility assistance. 336-342-1394 ? RCATS: Transportation to medical appointments. 336-347-2287 ? Social Security Administration: May apply for disability if have a Stage IV cancer. 336-342-7796 1-800-772-1213 ? [place] Aging, Disability and Transit Services: Assists with nutrition, care and transit needs. 336-349-2343  Cancer Center Support Programs: @10RELATIVEDAYS@ > Cancer Support Group  2nd Tuesday of the month 1pm-2pm, Journey Room  > Creative Journey  3rd Tuesday of the month 1130am-1pm, Journey Room  > Look Good Feel Better  1st Wednesday of the month 10am-12 noon, Journey Room (Call [employer] to register 1-800-395-5775)   

## 2016-09-12 ENCOUNTER — Other Ambulatory Visit (HOSPITAL_COMMUNITY): Payer: Self-pay | Admitting: Oncology

## 2016-09-12 DIAGNOSIS — I10 Essential (primary) hypertension: Secondary | ICD-10-CM | POA: Diagnosis not present

## 2016-09-12 DIAGNOSIS — J441 Chronic obstructive pulmonary disease with (acute) exacerbation: Secondary | ICD-10-CM | POA: Diagnosis not present

## 2016-09-12 DIAGNOSIS — C9000 Multiple myeloma not having achieved remission: Secondary | ICD-10-CM

## 2016-09-12 DIAGNOSIS — E1169 Type 2 diabetes mellitus with other specified complication: Secondary | ICD-10-CM | POA: Diagnosis not present

## 2016-09-12 DIAGNOSIS — J439 Emphysema, unspecified: Secondary | ICD-10-CM | POA: Diagnosis not present

## 2016-09-12 DIAGNOSIS — D630 Anemia in neoplastic disease: Secondary | ICD-10-CM | POA: Diagnosis not present

## 2016-09-12 MED ORDER — LENALIDOMIDE 10 MG PO CAPS: 10.0000 mg | ORAL_CAPSULE | ORAL | 1 refills | Status: DC

## 2016-09-17 ENCOUNTER — Encounter (HOSPITAL_COMMUNITY): Payer: Self-pay | Admitting: *Deleted

## 2016-09-17 ENCOUNTER — Observation Stay (HOSPITAL_COMMUNITY)
Admission: EM | Admit: 2016-09-17 | Discharge: 2016-09-19 | Disposition: A | Discharge status: Home / Self Care | Payer: Medicare Other | Attending: Family Medicine | Admitting: Family Medicine

## 2016-09-17 ENCOUNTER — Emergency Department (HOSPITAL_COMMUNITY): Payer: Medicare Other

## 2016-09-17 ENCOUNTER — Other Ambulatory Visit: Payer: Self-pay

## 2016-09-17 DIAGNOSIS — E119 Type 2 diabetes mellitus without complications: Secondary | ICD-10-CM | POA: Insufficient documentation

## 2016-09-17 DIAGNOSIS — R0602 Shortness of breath: Secondary | ICD-10-CM | POA: Diagnosis not present

## 2016-09-17 DIAGNOSIS — J441 Chronic obstructive pulmonary disease with (acute) exacerbation: Secondary | ICD-10-CM | POA: Diagnosis not present

## 2016-09-17 DIAGNOSIS — I248 Other forms of acute ischemic heart disease: Secondary | ICD-10-CM | POA: Insufficient documentation

## 2016-09-17 DIAGNOSIS — J9621 Acute and chronic respiratory failure with hypoxia: Secondary | ICD-10-CM | POA: Diagnosis not present

## 2016-09-17 DIAGNOSIS — Z87891 Personal history of nicotine dependence: Secondary | ICD-10-CM | POA: Diagnosis not present

## 2016-09-17 DIAGNOSIS — Z7982 Long term (current) use of aspirin: Secondary | ICD-10-CM | POA: Diagnosis not present

## 2016-09-17 DIAGNOSIS — Z79899 Other long term (current) drug therapy: Secondary | ICD-10-CM | POA: Diagnosis not present

## 2016-09-17 DIAGNOSIS — Z7984 Long term (current) use of oral hypoglycemic drugs: Secondary | ICD-10-CM | POA: Insufficient documentation

## 2016-09-17 DIAGNOSIS — J9601 Acute respiratory failure with hypoxia: Secondary | ICD-10-CM

## 2016-09-17 DIAGNOSIS — R7989 Other specified abnormal findings of blood chemistry: Secondary | ICD-10-CM

## 2016-09-17 DIAGNOSIS — J449 Chronic obstructive pulmonary disease, unspecified: Secondary | ICD-10-CM | POA: Diagnosis present

## 2016-09-17 DIAGNOSIS — I10 Essential (primary) hypertension: Secondary | ICD-10-CM | POA: Diagnosis not present

## 2016-09-17 DIAGNOSIS — F411 Generalized anxiety disorder: Secondary | ICD-10-CM

## 2016-09-17 DIAGNOSIS — R778 Other specified abnormalities of plasma proteins: Secondary | ICD-10-CM

## 2016-09-17 LAB — BLOOD GAS, ARTERIAL
Acid-Base Excess: 9 mmol/L — ABNORMAL HIGH (ref 0.0–2.0)
Bicarbonate: 32.4 mmol/L — ABNORMAL HIGH (ref 20.0–28.0)
DRAWN BY: 234301
O2 CONTENT: 3 L/min
O2 Saturation: 98.9 %
PATIENT TEMPERATURE: 37
pCO2 arterial: 54.2 mmHg — ABNORMAL HIGH (ref 32.0–48.0)
pH, Arterial: 7.412 (ref 7.350–7.450)
pO2, Arterial: 128 mmHg — ABNORMAL HIGH (ref 83.0–108.0)

## 2016-09-17 LAB — TROPONIN I
Troponin I: 0.03 ng/mL (ref <0.03)
Troponin I: <0.03 ng/mL (ref <0.03)

## 2016-09-17 LAB — GLUCOSE, CAPILLARY: Glucose-Capillary: 272 mg/dL — ABNORMAL HIGH (ref 65–99)

## 2016-09-17 LAB — CBC
HEMATOCRIT: 33.9 % — AB (ref 36.0–46.0)
Hemoglobin: 11 g/dL — ABNORMAL LOW (ref 12.0–15.0)
MCH: 30.7 pg (ref 26.0–34.0)
MCHC: 32.4 g/dL (ref 30.0–36.0)
MCV: 94.7 fL (ref 78.0–100.0)
Platelets: 112 10*3/uL — ABNORMAL LOW (ref 150–400)
RBC: 3.58 MIL/uL — AB (ref 3.87–5.11)
RDW: 16 % — AB (ref 11.5–15.5)
WBC: 4.5 10*3/uL (ref 4.0–10.5)

## 2016-09-17 LAB — COMPREHENSIVE METABOLIC PANEL
ALBUMIN: 4 g/dL (ref 3.5–5.0)
ALK PHOS: 57 U/L (ref 38–126)
ALT: 23 U/L (ref 14–54)
ANION GAP: 9 (ref 5–15)
AST: 20 U/L (ref 15–41)
BUN: 19 mg/dL (ref 6–20)
CO2: 35 mmol/L — AB (ref 22–32)
Calcium: 9.3 mg/dL (ref 8.9–10.3)
Chloride: 92 mmol/L — ABNORMAL LOW (ref 101–111)
Creatinine, Ser: 1.18 mg/dL — ABNORMAL HIGH (ref 0.44–1.00)
GFR calc Af Amer: 49 mL/min — ABNORMAL LOW (ref >60)
GFR calc non Af Amer: 43 mL/min — ABNORMAL LOW (ref >60)
GLUCOSE: 218 mg/dL — AB (ref 65–99)
POTASSIUM: 3.7 mmol/L (ref 3.5–5.1)
SODIUM: 136 mmol/L (ref 135–145)
Total Bilirubin: 0.8 mg/dL (ref 0.3–1.2)
Total Protein: 7.5 g/dL (ref 6.5–8.1)

## 2016-09-17 LAB — CREATININE, SERUM
Creatinine, Ser: 1.17 mg/dL — ABNORMAL HIGH (ref 0.44–1.00)
GFR, EST AFRICAN AMERICAN: 50 mL/min — AB (ref >60)
GFR, EST NON AFRICAN AMERICAN: 43 mL/min — AB (ref >60)

## 2016-09-17 LAB — CBC WITH DIFFERENTIAL/PLATELET
BASOS PCT: 1 %
Basophils Absolute: 0 10*3/uL (ref 0.0–0.1)
EOS ABS: 0.1 10*3/uL (ref 0.0–0.7)
Eosinophils Relative: 2 %
HCT: 39 % (ref 36.0–46.0)
HEMOGLOBIN: 12.6 g/dL (ref 12.0–15.0)
Lymphocytes Relative: 7 %
Lymphs Abs: 0.4 10*3/uL — ABNORMAL LOW (ref 0.7–4.0)
MCH: 30.9 pg (ref 26.0–34.0)
MCHC: 32.3 g/dL (ref 30.0–36.0)
MCV: 95.6 fL (ref 78.0–100.0)
MONOS PCT: 7 %
Monocytes Absolute: 0.4 10*3/uL (ref 0.1–1.0)
NEUTROS PCT: 83 %
Neutro Abs: 4.5 10*3/uL (ref 1.7–7.7)
Platelets: 130 10*3/uL — ABNORMAL LOW (ref 150–400)
RBC: 4.08 MIL/uL (ref 3.87–5.11)
RDW: 16 % — AB (ref 11.5–15.5)
WBC: 5.3 10*3/uL (ref 4.0–10.5)

## 2016-09-17 LAB — TSH: TSH: 0.405 u[IU]/mL (ref 0.350–4.500)

## 2016-09-17 LAB — BRAIN NATRIURETIC PEPTIDE: B Natriuretic Peptide: 187 pg/mL — ABNORMAL HIGH (ref 0.0–100.0)

## 2016-09-17 LAB — INFLUENZA PANEL BY PCR (TYPE A & B)
INFLAPCR: NEGATIVE
Influenza B By PCR: NEGATIVE

## 2016-09-17 MED ORDER — SODIUM CHLORIDE 0.9% FLUSH
3.0000 mL | Freq: Two times a day (BID) | INTRAVENOUS | Status: DC
  Administered 2016-09-17 – 2016-09-19 (×4): 3 mL via INTRAVENOUS

## 2016-09-17 MED ORDER — FUROSEMIDE 40 MG PO TABS
40.0000 mg | ORAL_TABLET | Freq: Two times a day (BID) | ORAL | Status: DC
  Administered 2016-09-17 – 2016-09-18 (×2): 40 mg via ORAL
  Filled 2016-09-17 (×2): qty 1

## 2016-09-17 MED ORDER — SODIUM CHLORIDE 0.9 % IV SOLN: 250.0000 mL | INTRAVENOUS | Status: DC | PRN

## 2016-09-17 MED ORDER — ALBUTEROL SULFATE (2.5 MG/3ML) 0.083% IN NEBU: 2.5000 mg | INHALATION_SOLUTION | RESPIRATORY_TRACT | Status: DC | PRN

## 2016-09-17 MED ORDER — TIOTROPIUM BROMIDE MONOHYDRATE 18 MCG IN CAPS
18.0000 ug | ORAL_CAPSULE | Freq: Every day | RESPIRATORY_TRACT | Status: DC
  Administered 2016-09-18 – 2016-09-19 (×2): 18 ug via RESPIRATORY_TRACT
  Filled 2016-09-17: qty 5

## 2016-09-17 MED ORDER — INSULIN ASPART 100 UNIT/ML ~~LOC~~ SOLN
0.0000 [IU] | Freq: Every day | SUBCUTANEOUS | Status: DC
  Administered 2016-09-17: 3 [IU] via SUBCUTANEOUS
  Administered 2016-09-18: 4 [IU] via SUBCUTANEOUS

## 2016-09-17 MED ORDER — INSULIN ASPART 100 UNIT/ML ~~LOC~~ SOLN
4.0000 [IU] | Freq: Three times a day (TID) | SUBCUTANEOUS | Status: DC
  Administered 2016-09-18 – 2016-09-19 (×5): 4 [IU] via SUBCUTANEOUS

## 2016-09-17 MED ORDER — ASPIRIN 81 MG PO CHEW
324.0000 mg | CHEWABLE_TABLET | Freq: Once | ORAL | Status: AC
  Administered 2016-09-17: 324 mg via ORAL
  Filled 2016-09-17: qty 4

## 2016-09-17 MED ORDER — ACYCLOVIR 800 MG PO TABS
800.0000 mg | ORAL_TABLET | Freq: Two times a day (BID) | ORAL | Status: DC
  Administered 2016-09-17 – 2016-09-19 (×4): 800 mg via ORAL
  Filled 2016-09-17 (×6): qty 1

## 2016-09-17 MED ORDER — INSULIN ASPART 100 UNIT/ML ~~LOC~~ SOLN
0.0000 [IU] | Freq: Three times a day (TID) | SUBCUTANEOUS | Status: DC
  Administered 2016-09-18: 11 [IU] via SUBCUTANEOUS
  Administered 2016-09-18 – 2016-09-19 (×3): 3 [IU] via SUBCUTANEOUS
  Administered 2016-09-19: 4 [IU] via SUBCUTANEOUS

## 2016-09-17 MED ORDER — ONDANSETRON HCL 4 MG/2ML IJ SOLN: 4.0000 mg | Freq: Four times a day (QID) | INTRAMUSCULAR | Status: DC | PRN

## 2016-09-17 MED ORDER — POTASSIUM CHLORIDE CRYS ER 20 MEQ PO TBCR
20.0000 meq | EXTENDED_RELEASE_TABLET | Freq: Two times a day (BID) | ORAL | Status: DC
  Administered 2016-09-17 – 2016-09-19 (×4): 20 meq via ORAL
  Filled 2016-09-17 (×4): qty 1

## 2016-09-17 MED ORDER — METHYLPREDNISOLONE SODIUM SUCC 125 MG IJ SOLR
80.0000 mg | Freq: Four times a day (QID) | INTRAMUSCULAR | Status: DC
  Administered 2016-09-17 – 2016-09-19 (×7): 80 mg via INTRAVENOUS
  Filled 2016-09-17 (×7): qty 2

## 2016-09-17 MED ORDER — METHYLPREDNISOLONE SODIUM SUCC 125 MG IJ SOLR
125.0000 mg | Freq: Once | INTRAMUSCULAR | Status: AC
  Administered 2016-09-17: 125 mg via INTRAVENOUS
  Filled 2016-09-17: qty 2

## 2016-09-17 MED ORDER — LOSARTAN POTASSIUM 50 MG PO TABS
50.0000 mg | ORAL_TABLET | Freq: Every morning | ORAL | Status: DC
  Administered 2016-09-18: 50 mg via ORAL
  Filled 2016-09-17: qty 1

## 2016-09-17 MED ORDER — DORZOLAMIDE HCL-TIMOLOL MAL 2-0.5 % OP SOLN
1.0000 [drp] | Freq: Two times a day (BID) | OPHTHALMIC | Status: DC
  Administered 2016-09-17 – 2016-09-19 (×4): 1 [drp] via OPHTHALMIC
  Filled 2016-09-17: qty 10

## 2016-09-17 MED ORDER — SENNOSIDES-DOCUSATE SODIUM 8.6-50 MG PO TABS: 1.0000 | ORAL_TABLET | Freq: Every evening | ORAL | Status: DC | PRN

## 2016-09-17 MED ORDER — ONDANSETRON HCL 4 MG PO TABS: 4.0000 mg | ORAL_TABLET | Freq: Four times a day (QID) | ORAL | Status: DC | PRN

## 2016-09-17 MED ORDER — ALPRAZOLAM 1 MG PO TABS
1.0000 mg | ORAL_TABLET | Freq: Two times a day (BID) | ORAL | Status: DC
  Administered 2016-09-17 – 2016-09-18 (×2): 1 mg via ORAL
  Filled 2016-09-17 (×2): qty 1

## 2016-09-17 MED ORDER — ASPIRIN EC 81 MG PO TBEC
81.0000 mg | DELAYED_RELEASE_TABLET | Freq: Every morning | ORAL | Status: DC
  Administered 2016-09-18 – 2016-09-19 (×2): 81 mg via ORAL
  Filled 2016-09-17 (×2): qty 1

## 2016-09-17 MED ORDER — HYDROCODONE-ACETAMINOPHEN 10-325 MG PO TABS
1.0000 | ORAL_TABLET | ORAL | Status: DC | PRN
  Administered 2016-09-17 – 2016-09-18 (×2): 1 via ORAL
  Filled 2016-09-17 (×2): qty 1

## 2016-09-17 MED ORDER — AZITHROMYCIN 250 MG PO TABS
500.0000 mg | ORAL_TABLET | Freq: Every day | ORAL | Status: DC
  Administered 2016-09-17 – 2016-09-19 (×3): 500 mg via ORAL
  Filled 2016-09-17 (×3): qty 2

## 2016-09-17 MED ORDER — ACETAMINOPHEN 650 MG RE SUPP: 650.0000 mg | Freq: Four times a day (QID) | RECTAL | Status: DC | PRN

## 2016-09-17 MED ORDER — ARFORMOTEROL TARTRATE 15 MCG/2ML IN NEBU
15.0000 ug | INHALATION_SOLUTION | Freq: Two times a day (BID) | RESPIRATORY_TRACT | Status: DC
  Administered 2016-09-17 – 2016-09-19 (×4): 15 ug via RESPIRATORY_TRACT
  Filled 2016-09-17 (×4): qty 2

## 2016-09-17 MED ORDER — ATORVASTATIN CALCIUM 10 MG PO TABS
10.0000 mg | ORAL_TABLET | Freq: Every morning | ORAL | Status: DC
  Administered 2016-09-18 – 2016-09-19 (×2): 10 mg via ORAL
  Filled 2016-09-17 (×2): qty 1

## 2016-09-17 MED ORDER — ENOXAPARIN SODIUM 30 MG/0.3ML ~~LOC~~ SOLN
30.0000 mg | SUBCUTANEOUS | Status: DC
  Administered 2016-09-17 – 2016-09-18 (×2): 30 mg via SUBCUTANEOUS
  Filled 2016-09-17 (×4): qty 0.3

## 2016-09-17 MED ORDER — BRIMONIDINE TARTRATE 0.2 % OP SOLN
1.0000 [drp] | Freq: Three times a day (TID) | OPHTHALMIC | Status: DC
  Administered 2016-09-17 – 2016-09-19 (×6): 1 [drp] via OPHTHALMIC
  Filled 2016-09-17: qty 5

## 2016-09-17 MED ORDER — SODIUM CHLORIDE 0.9% FLUSH: 3.0000 mL | INTRAVENOUS | Status: DC | PRN

## 2016-09-17 MED ORDER — LATANOPROST 0.005 % OP SOLN
1.0000 [drp] | Freq: Every day | OPHTHALMIC | Status: DC
  Administered 2016-09-17 – 2016-09-18 (×2): 1 [drp] via OPHTHALMIC
  Filled 2016-09-17: qty 2.5

## 2016-09-17 MED ORDER — LENALIDOMIDE 10 MG PO CAPS: 10.0000 mg | ORAL_CAPSULE | ORAL | Status: DC

## 2016-09-17 MED ORDER — IPRATROPIUM-ALBUTEROL 0.5-2.5 (3) MG/3ML IN SOLN
3.0000 mL | Freq: Once | RESPIRATORY_TRACT | Status: AC
  Administered 2016-09-17: 3 mL via RESPIRATORY_TRACT
  Filled 2016-09-17: qty 3

## 2016-09-17 MED ORDER — ACETAMINOPHEN 325 MG PO TABS: 650.0000 mg | ORAL_TABLET | Freq: Four times a day (QID) | ORAL | Status: DC | PRN

## 2016-09-17 NOTE — ED Triage Notes (Signed)
Pt c/o SOB intermittently since Thanksgiving. Pt's family reports she wears bipap at night and it "pulls oxygen off of her" and "causes her to be short of breath". Pt's family reports she has been hospitalized twice since Thanksgiving with the same issue. Family reports they feel something is wrong with her bipap machine and they have called a technician to come out to the house and evaluate it.

## 2016-09-17 NOTE — H&P (Signed)
History and Physical    Brooke Keller:[id number] DOB: June 15, 1935 DOA: 09/17/2016  Referring MD/NP/PA: Milton Ferguson, EDP PCP: Curlene Labrum, MD  Patient coming from: home  Chief Complaint: SOB  HPI: Brooke Keller is a 81 y.o. female with h/o COPD on CPAP qhs, MM on revlimid, DM coming in with acute SOB that began this afternoon. No URI symptoms, no sick contacts, no fevers/chills. Found to have poor air movement. CXR with chronic changes.  Past Medical/Surgical History: Past Medical History:  Diagnosis Date  . COPD (chronic obstructive pulmonary disease) ([place])   . Diabetes mellitus   . GERD (gastroesophageal reflux disease)   . Hypertension   . IgG multiple myeloma ([place]) 04/14/2016  . Multiple myeloma     Past Surgical History:  Procedure Laterality Date  . carpal tunne    . KYPHOPLASTY    . right fifth toe      Social History:  reports that she quit smoking about 8 years ago. She quit smokeless tobacco use about 9 years ago. She reports that she does not drink alcohol or use drugs.  Allergies: Allergies  Allergen Reactions  . Ativan [Lorazepam] Other (See Comments)    hallucinations    Family History:  No heart disease CVA in parents or siblings  Prior to Admission medications   Medication Sig Start Date End Date Taking? Authorizing Provider  acyclovir (ZOVIRAX) 800 MG tablet Take 800 mg by mouth 2 (two) times daily.   Yes Historical Provider, MD  ALPRAZolam Duanne Moron) 1 MG tablet Take 1 tablet (1 mg total) by mouth 2 (two) times daily. 07/15/16  Yes Patrici Ranks, MD  aspirin EC 81 MG tablet Take 81 mg by mouth every morning.    Yes Historical Provider, MD  atorvastatin (LIPITOR) 10 MG tablet Take 10 mg by mouth every morning.    Yes Historical Provider, MD  brimonidine (ALPHAGAN) 0.2 % ophthalmic solution INSTILL ONE DROP IN [facility] EYE THREE TIMES DAILY 03/29/16  Yes Historical Provider, MD  calcium-vitamin D (OSCAL WITH D) 500-200 MG-UNIT per tablet Take 1  tablet by mouth 2 (two) times daily.   Yes Historical Provider, MD  dorzolamide-timolol (COSOPT) 22.3-6.8 MG/ML ophthalmic solution Place 1 drop into both eyes 2 (two) times daily. 03/29/16  Yes Historical Provider, MD  furosemide (LASIX) 40 MG tablet TAKE ONE TABLET BY MOUTH IN THE MORNING, TAKE 1/2 TABLET IN THE AFTERNOON AND TAKE 1/2 TABLET AT NIGHT 03/29/16  Yes Historical Provider, MD  ipratropium-albuterol (DUONEB) 0.5-2.5 (3) MG/3ML SOLN Take 3 mLs by nebulization 2 (two) times daily.     Yes Historical Provider, MD  lenalidomide (REVLIMID) 10 MG capsule Take 1 capsule (10 mg total) by mouth every other day. 09/12/16  Yes Manon Hilding Kefalas, PA-C  losartan (COZAAR) 50 MG tablet Take 50 mg by mouth every morning.    Yes Historical Provider, MD  LUMIGAN 0.01 % SOLN INSTILL ONE DROP IN [facility] EYE AT BEDTIME 03/29/16  Yes Historical Provider, MD  metFORMIN (GLUCOPHAGE) 500 MG tablet Take 500 mg by mouth daily with breakfast.   Yes Historical Provider, MD  PERFOROMIST 20 MCG/2ML nebulizer solution Inhale 20 mcg into the lungs 2 (two) times daily.  07/08/12  Yes Historical Provider, MD  potassium chloride SA (K-DUR,KLOR-CON) 20 MEQ tablet Take 20 mEq by mouth 2 (two) times daily. 03/21/16  Yes Historical Provider, MD  predniSONE (DELTASONE) 10 MG tablet Take 10 mg by mouth daily.    Yes Historical Provider, MD  HYDROcodone-acetaminophen (  NORCO) 10-325 MG tablet Take 1 tablet by mouth every 4 (four) hours as needed. for pain 08/30/16   Manon Hilding Kefalas, PA-C  nystatin cream (MYCOSTATIN) APPLY TO THE AFFECTED AREA AS NEEDED IRRITATION 03/18/16   Historical Provider, MD    Review of Systems:  Constitutional: Denies fever, chills, diaphoresis, appetite change and fatigue.  HEENT: Denies photophobia, eye pain, redness, hearing loss, ear pain, congestion, sore throat, rhinorrhea, sneezing, mouth sores, trouble swallowing, neck pain, neck stiffness and tinnitus.   Respiratory: Denies  cough, chest tightness,  and  wheezing.   Cardiovascular: Denies chest pain, palpitations and leg swelling.  Gastrointestinal: Denies nausea, vomiting, abdominal pain, diarrhea, constipation, blood in stool and abdominal distention.  Genitourinary: Denies dysuria, urgency, frequency, hematuria, flank pain and difficulty urinating.  Endocrine: Denies: hot or cold intolerance, sweats, changes in hair or nails, polyuria, polydipsia. Musculoskeletal: Denies myalgias, back pain, joint swelling, arthralgias and gait problem.  Skin: Denies pallor, rash and wound.  Neurological: Denies dizziness, seizures, syncope, weakness, light-headedness, numbness and headaches.  Hematological: Denies adenopathy. Easy bruising, personal or family bleeding history  Psychiatric/Behavioral: Denies suicidal ideation, mood changes, confusion, nervousness, sleep disturbance and agitation    Physical Exam: Vitals:   09/17/16 1545 09/17/16 1600 09/17/16 1615 09/17/16 1650  BP:  117/62  123/64  Pulse: 78 75 76 71  Resp: _0 Temp:    97.7 F (36.5 C)  TempSrc:    Oral  SpO2: 97% 100% 97% 100%  Weight:    55.8 kg (123 lb)  Height:    _1  (1.397 m)     Constitutional: NAD, calm, comfortable Eyes: PERRL, lids and conjunctivae normal ENMT: Mucous membranes are moist. Posterior pharynx clear of any exudate or lesions.Normal dentition.  Neck: normal, supple, no masses, no thyromegaly Respiratory: poor air movement, mild exp wheezes Cardiovascular: Regular rate and rhythm, no murmurs / rubs / gallops. No extremity edema. 2+ pedal pulses. No carotid bruits.  Abdomen: no tenderness, no masses palpated. No hepatosplenomegaly. Bowel sounds positive.  Musculoskeletal: no clubbing / cyanosis. No joint deformity upper and lower extremities. Good ROM, no contractures. Normal muscle tone.  Skin: no rashes, lesions, ulcers. No induration Neurologic: CN 2-12 grossly intact. Sensation intact, DTR normal. Strength 5/5 in all 4.  Psychiatric:  Normal judgment and insight. Alert and oriented x 3. Normal mood.    Labs on Admission: I have personally reviewed the following labs and imaging studies  CBC:  Recent Labs Lab 09/17/16 1317  WBC 5.3  NEUTROABS 4.5  HGB 12.6  HCT 39.0  MCV 95.6  PLT 885*   Basic Metabolic Panel:  Recent Labs Lab 09/17/16 1317  NA 136  K 3.7  CL 92*  CO2 35*  GLUCOSE 218*  BUN 19  CREATININE 1.18*  CALCIUM 9.3   GFR: Estimated Creatinine Clearance: 26.1 mL/min (by C-G formula based on SCr of 1.18 mg/dL (H)). Liver Function Tests:  Recent Labs Lab 09/17/16 1317  AST 20  ALT 23  ALKPHOS 57  BILITOT 0.8  PROT 7.5  ALBUMIN 4.0   No results for input(s): LIPASE, AMYLASE in the last 168 hours. No results for input(s): AMMONIA in the last 168 hours. Coagulation Profile: No results for input(s): INR, PROTIME in the last 168 hours. Cardiac Enzymes:  Recent Labs Lab 09/17/16 1317  TROPONINI 0.03*   BNP (last 3 results) No results for input(s): PROBNP in the last 8760 hours. HbA1C: No results for input(s): HGBA1C in the last 72  hours. CBG: No results for input(s): GLUCAP in the last 168 hours. Lipid Profile: No results for input(s): CHOL, HDL, LDLCALC, TRIG, CHOLHDL, LDLDIRECT in the last 72 hours. Thyroid Function Tests: No results for input(s): TSH, T4TOTAL, FREET4, T3FREE, THYROIDAB in the last 72 hours. Anemia Panel: No results for input(s): VITAMINB12, FOLATE, FERRITIN, TIBC, IRON, RETICCTPCT in the last 72 hours. Urine analysis:    Component Value Date/Time   COLORURINE YELLOW 10/01/2011 0820   APPEARANCEUR CLOUDY (A) 10/01/2011 0820   LABSPEC 1.022 10/01/2011 0820   PHURINE 5.0 10/01/2011 0820   GLUCOSEU 100 (A) 10/01/2011 0820   HGBUR SMALL (A) 10/01/2011 0820   BILIRUBINUR NEGATIVE 10/01/2011 0820   KETONESUR NEGATIVE 10/01/2011 0820   PROTEINUR NEGATIVE 10/01/2011 0820   UROBILINOGEN 1.0 10/01/2011 0820   NITRITE POSITIVE (A) 10/01/2011 0820    LEUKOCYTESUR LARGE (A) 10/01/2011 0820   Sepsis Labs: _0 (procalcitonin:4,lacticidven:4) )No results found for this or any previous visit (from the past 240 hour(s)).   Radiological Exams on Admission: Dg Chest 2 View  Result Date: 09/17/2016 CLINICAL DATA:  Shortness of Breath EXAM: CHEST  2 VIEW COMPARISON:  09/01/2016 FINDINGS: Cardiomegaly again noted. Old bilateral rib fractures. Stable right IJ Port-A-Cath position. No infiltrate or pulmonary edema. Thoracic spine osteopenia. Prior vertebroplasty mid thoracic spine. Mild compression deformities lower thoracic spine of indeterminate age. IMPRESSION: No active cardiopulmonary disease. Cardiomegaly again noted. Stable right Port-A-Cath position. Electronically Signed   By: Lahoma Crocker M.D.   On: 09/17/2016 12:27    EKG: Independently reviewed. NSR, no acute ischemic changes  Assessment/Plan Principal Problem:   Acute on chronic respiratory failure (HCC) Active Problems:   COPD (chronic obstructive pulmonary disease) (HCC)   COPD with acute exacerbation (HCC)   Elevated troponin    Acute on Chronic Hypoxemic Resp failure -Secondary to COPD with acute exacerbation. -Steroids, frequent nebs, spiriva.  Elevated Troponin -Mild, no CP. -likely 2/2 COPD exacerbation. -cycle troponins, check ECHO. -If ECHO with normal EF and no WMA, do not anticipate further cardiac work up.  DM -Check A1C. -Place on SSI.   DVT prophylaxis: lovenox  Code Status: full code  Family Communication: daughter at bedside  Disposition Plan: home when medically stable, anticipate 48-72 hours  Consults called: none  Admission status: observation    Time Spent: 85 minutes  Lelon Frohlich MD [facility] Pager [phone]  If 7PM-7AM, please contact night-coverage [email or web address] Password [email or web address]  09/17/2016, 5:35 PM

## 2016-09-17 NOTE — ED Notes (Signed)
Pt stable and ready for transport to [facility].  Report given to Weyman Pedro, RN.

## 2016-09-17 NOTE — ED Provider Notes (Signed)
[facility] DEPT Provider Note   CSN: [id number] Arrival date & time: 09/17/16  1140     History   Chief Complaint Chief Complaint  Patient presents with  . Shortness of Breath    HPI Brooke Keller is a 81 y.o. female.  HPI 82 year old female with past medical history of multiple myeloma, COPD, hypertension, who presents with shortness of breath. The patient has reportedly had progressive functional decline since Thanksgiving. She was hospitalized at an outside hospital for COPD exacerbation twice around Thanksgiving and has not recovered. Over the last several weeks, she is a progressive worsening dyspnea on exertion and now dyspnea at rest. She denies any associated chest pain. Over the last 24 hours, patient has had difficulty walking around her house due to the shortness of breath as well as increasing cough with sputum production. She subsequent presents for evaluation. Denies any chest pain. Denies any known fevers. Symptoms are all made worse with exertion and improved with rest.  Past Medical History:  Diagnosis Date  . COPD (chronic obstructive pulmonary disease) ([place])   . Diabetes mellitus   . GERD (gastroesophageal reflux disease)   . Hypertension   . IgG multiple myeloma ([place]) 04/14/2016  . Multiple myeloma     Patient Active Problem List   Diagnosis Date Noted  . COPD (chronic obstructive pulmonary disease) ([place]) 09/17/2016  . Acute on chronic respiratory failure ([place]) 09/17/2016  . COPD with acute exacerbation ([place]) 09/17/2016  . Elevated troponin 09/17/2016  . IgG multiple myeloma ([place]) 04/14/2016  . Pain 01/25/2016  . Cancer associated pain 02/09/2015  . Port catheter in place 09/20/2013    Past Surgical History:  Procedure Laterality Date  . carpal tunne    . KYPHOPLASTY    . right fifth toe      OB History    No data available       Home Medications    Prior to Admission medications   Medication Sig Start Date End Date Taking? Authorizing  Provider  acyclovir (ZOVIRAX) 800 MG tablet Take 800 mg by mouth 2 (two) times daily.   Yes Historical Provider, MD  ALPRAZolam Duanne Moron) 1 MG tablet Take 1 tablet (1 mg total) by mouth 2 (two) times daily. 07/15/16  Yes Patrici Ranks, MD  aspirin EC 81 MG tablet Take 81 mg by mouth every morning.    Yes Historical Provider, MD  atorvastatin (LIPITOR) 10 MG tablet Take 10 mg by mouth every morning.    Yes Historical Provider, MD  brimonidine (ALPHAGAN) 0.2 % ophthalmic solution INSTILL ONE DROP IN [facility] EYE THREE TIMES DAILY 03/29/16  Yes Historical Provider, MD  calcium-vitamin D (OSCAL WITH D) 500-200 MG-UNIT per tablet Take 1 tablet by mouth 2 (two) times daily.   Yes Historical Provider, MD  dorzolamide-timolol (COSOPT) 22.3-6.8 MG/ML ophthalmic solution Place 1 drop into both eyes 2 (two) times daily. 03/29/16  Yes Historical Provider, MD  furosemide (LASIX) 40 MG tablet TAKE ONE TABLET BY MOUTH IN THE MORNING, TAKE 1/2 TABLET IN THE AFTERNOON AND TAKE 1/2 TABLET AT NIGHT 03/29/16  Yes Historical Provider, MD  ipratropium-albuterol (DUONEB) 0.5-2.5 (3) MG/3ML SOLN Take 3 mLs by nebulization 2 (two) times daily.     Yes Historical Provider, MD  lenalidomide (REVLIMID) 10 MG capsule Take 1 capsule (10 mg total) by mouth every other day. 09/12/16  Yes Manon Hilding Kefalas, PA-C  losartan (COZAAR) 50 MG tablet Take 50 mg by mouth every morning.    Yes Historical Provider,  MD  LUMIGAN 0.01 % SOLN INSTILL ONE DROP IN [facility] EYE AT BEDTIME 03/29/16  Yes Historical Provider, MD  metFORMIN (GLUCOPHAGE) 500 MG tablet Take 500 mg by mouth daily with breakfast.   Yes Historical Provider, MD  PERFOROMIST 20 MCG/2ML nebulizer solution Inhale 20 mcg into the lungs 2 (two) times daily.  07/08/12  Yes Historical Provider, MD  potassium chloride SA (K-DUR,KLOR-CON) 20 MEQ tablet Take 20 mEq by mouth 2 (two) times daily. 03/21/16  Yes Historical Provider, MD  predniSONE (DELTASONE) 10 MG tablet Take 10 mg by mouth daily.     Yes Historical Provider, MD  HYDROcodone-acetaminophen (NORCO) 10-325 MG tablet Take 1 tablet by mouth every 4 (four) hours as needed. for pain 08/30/16   Manon Hilding Kefalas, PA-C  nystatin cream (MYCOSTATIN) APPLY TO THE AFFECTED AREA AS NEEDED IRRITATION 03/18/16   Historical Provider, MD    Family History History reviewed. No pertinent family history.  Social History Social History  Substance Use Topics  . Smoking status: Former Smoker    Quit date: 09/30/2007  . Smokeless tobacco: Former Systems developer    Quit date: 09/30/2006  . Alcohol use No     Allergies   Ativan [lorazepam]   Review of Systems Review of Systems  Constitutional: Positive for fatigue. Negative for chills and fever.  HENT: Negative for congestion, rhinorrhea and sore throat.   Eyes: Negative for visual disturbance.  Respiratory: Positive for cough, shortness of breath and wheezing.   Cardiovascular: Negative for chest pain and leg swelling.  Gastrointestinal: Negative for abdominal pain, diarrhea, nausea and vomiting.  Genitourinary: Negative for dysuria, flank pain, vaginal bleeding and vaginal discharge.  Musculoskeletal: Negative for neck pain.  Skin: Negative for rash.  Allergic/Immunologic: Negative for immunocompromised state.  Neurological: Negative for syncope and headaches.  Hematological: Does not bruise/bleed easily.  All other systems reviewed and are negative.    Physical Exam Updated Vital Signs BP 119/61 (BP Location: Right Arm)   Pulse 75   Temp 97.9 F (36.6 C) (Oral)   Resp 18   Ht 4' 7"  (1.397 m)   Wt 123 lb (55.8 kg)   SpO2 94%   BMI 28.59 kg/m   Physical Exam  Constitutional: She is oriented to person, place, and time. She appears well-developed and well-nourished. No distress.  HENT:  Head: Normocephalic and atraumatic.  Eyes: Conjunctivae are normal.  Neck: Neck supple.  Cardiovascular: Normal rate, regular rhythm and normal heart sounds.  Exam reveals no friction rub.   No  murmur heard. Pulmonary/Chest: Effort normal. Tachypnea noted. No respiratory distress. She has decreased breath sounds in the right upper field, the right middle field, the right lower field, the left upper field, the left middle field and the left lower field. She has wheezes in the right lower field and the left lower field. She has no rhonchi. She has no rales.  Abdominal: She exhibits no distension.  Musculoskeletal: She exhibits no edema.  Neurological: She is alert and oriented to person, place, and time. She exhibits normal muscle tone.  Skin: Skin is warm. Capillary refill takes less than 2 seconds.  Psychiatric: She has a normal mood and affect.  Nursing note and vitals reviewed.    ED Treatments / Results  Labs (all labs ordered are listed, but only abnormal results are displayed) Labs Reviewed  CBC WITH DIFFERENTIAL/PLATELET - Abnormal; Notable for the following:       Result Value   RDW 16.0 (*)    Platelets 130 (*)  Lymphs Abs 0.4 (*)    All other components within normal limits  COMPREHENSIVE METABOLIC PANEL - Abnormal; Notable for the following:    Chloride 92 (*)    CO2 35 (*)    Glucose, Bld 218 (*)    Creatinine, Ser 1.18 (*)    GFR calc non Af Amer 43 (*)    GFR calc Af Amer 49 (*)    All other components within normal limits  BRAIN NATRIURETIC PEPTIDE - Abnormal; Notable for the following:    B Natriuretic Peptide 187.0 (*)    All other components within normal limits  TROPONIN I - Abnormal; Notable for the following:    Troponin I 0.03 (*)    All other components within normal limits  BLOOD GAS, ARTERIAL - Abnormal; Notable for the following:    pCO2 arterial 54.2 (*)    pO2, Arterial 128 (*)    Bicarbonate 32.4 (*)    Acid-Base Excess 9.0 (*)    All other components within normal limits  CBC - Abnormal; Notable for the following:    RBC 3.58 (*)    Hemoglobin 11.0 (*)    HCT 33.9 (*)    RDW 16.0 (*)    Platelets 112 (*)    All other components  within normal limits  CREATININE, SERUM - Abnormal; Notable for the following:    Creatinine, Ser 1.17 (*)    GFR calc non Af Amer 43 (*)    GFR calc Af Amer 50 (*)    All other components within normal limits  GLUCOSE, CAPILLARY - Abnormal; Notable for the following:    Glucose-Capillary 272 (*)    All other components within normal limits  TROPONIN I  TSH  INFLUENZA PANEL BY PCR (TYPE A & B, H1N1)  TROPONIN I  TROPONIN I  BASIC METABOLIC PANEL  CBC  HEMOGLOBIN A1C    EKG  EKG Interpretation  Date/Time:  Tuesday September 17 2016 12:30:15 EST Ventricular Rate:  75 PR Interval:    QRS Duration: 93 QT Interval:  370 QTC Calculation: 414 R Axis:   -7 Text Interpretation:  Ectopic atrial rhythm Abnormal R-wave progression, early transition Borderline T wave abnormalities No old tracing to compare No ST elevations or depressions Confirmed by Ellender Hose MD, Gordon Vandunk [phone]) on 09/17/2016 2:15:16 PM       Radiology Dg Chest 2 View  Result Date: 09/17/2016 CLINICAL DATA:  Shortness of Breath EXAM: CHEST  2 VIEW COMPARISON:  09/01/2016 FINDINGS: Cardiomegaly again noted. Old bilateral rib fractures. Stable right IJ Port-A-Cath position. No infiltrate or pulmonary edema. Thoracic spine osteopenia. Prior vertebroplasty mid thoracic spine. Mild compression deformities lower thoracic spine of indeterminate age. IMPRESSION: No active cardiopulmonary disease. Cardiomegaly again noted. Stable right Port-A-Cath position. Electronically Signed   By: Lahoma Crocker M.D.   On: 09/17/2016 12:27    Procedures Procedures (including critical care time)  Medications Ordered in ED Medications  lenalidomide (REVLIMID) capsule 10 mg (not administered)  HYDROcodone-acetaminophen (NORCO) 10-325 MG per tablet 1 tablet (1 tablet Oral Given 09/17/16 2136)  ALPRAZolam (XANAX) tablet 1 mg (1 mg Oral Given 09/17/16 2129)  brimonidine (ALPHAGAN) 0.2 % ophthalmic solution 1 drop (1 drop Both Eyes Given 09/17/16 2130)    dorzolamide-timolol (COSOPT) 22.3-6.8 MG/ML ophthalmic solution 1 drop (1 drop Both Eyes Given 09/17/16 2130)  furosemide (LASIX) tablet 40 mg (40 mg Oral Given 09/17/16 1839)  latanoprost (XALATAN) 0.005 % ophthalmic solution 1 drop (1 drop Both Eyes Given 09/17/16 2129)  potassium  chloride SA (K-DUR,KLOR-CON) CR tablet 20 mEq (20 mEq Oral Given 09/17/16 2129)  acyclovir (ZOVIRAX) tablet 800 mg (800 mg Oral Given 09/17/16 2129)  atorvastatin (LIPITOR) tablet 10 mg (not administered)  losartan (COZAAR) tablet 50 mg (not administered)  arformoterol (BROVANA) nebulizer solution 15 mcg (15 mcg Nebulization Given 09/17/16 2020)  aspirin EC tablet 81 mg (not administered)  methylPREDNISolone sodium succinate (SOLU-MEDROL) 125 mg/2 mL injection 80 mg (80 mg Intravenous Given 09/17/16 2130)  albuterol (PROVENTIL) (2.5 MG/3ML) 0.083% nebulizer solution 2.5 mg (not administered)  tiotropium (SPIRIVA) inhalation capsule 18 mcg (not administered)  enoxaparin (LOVENOX) injection 30 mg (30 mg Subcutaneous Given 09/17/16 1840)  sodium chloride flush (NS) 0.9 % injection 3 mL (3 mLs Intravenous Given 09/17/16 2131)  sodium chloride flush (NS) 0.9 % injection 3 mL (not administered)  0.9 %  sodium chloride infusion (not administered)  acetaminophen (TYLENOL) tablet 650 mg (not administered)    Or  acetaminophen (TYLENOL) suppository 650 mg (not administered)  senna-docusate (Senokot-S) tablet 1 tablet (not administered)  ondansetron (ZOFRAN) tablet 4 mg (not administered)    Or  ondansetron (ZOFRAN) injection 4 mg (not administered)  insulin aspart (novoLOG) injection 0-15 Units (not administered)  insulin aspart (novoLOG) injection 0-5 Units (3 Units Subcutaneous Given 09/17/16 2129)  insulin aspart (novoLOG) injection 4 Units (not administered)  azithromycin (ZITHROMAX) tablet 500 mg (500 mg Oral Given 09/17/16 1839)  methylPREDNISolone sodium succinate (SOLU-MEDROL) 125 mg/2 mL injection 125 mg (125 mg  Intravenous Given 09/17/16 1319)  ipratropium-albuterol (DUONEB) 0.5-2.5 (3) MG/3ML nebulizer solution 3 mL (3 mLs Nebulization Given 09/17/16 1349)  aspirin chewable tablet 324 mg (324 mg Oral Given 09/17/16 1421)     Initial Impression / Assessment and Plan / ED Course  I have reviewed the triage vital signs and the nursing notes.  Pertinent labs & imaging results that were available during my care of the patient were reviewed by me and considered in my medical decision making (see chart for details).  Clinical Course     81 year old female with past medical history as above who presents with progressively worsening shortness of breath and wheezing. On arrival, vital signs are stable. On exam, she has markedly diminished breath sounds with mild wheezing. Chest x-ray is clear. EKG shows no acute ischemia but troponin and BNP both mildly elevated. CMP shows chronic CO2 retention. Blood gas is consistent with chronic hypercapneic resp failure. Primary suspicion is ongoing COPD exacerbation with demand ischemia. Will give breathing treatment, steroids, and admit to hospitalists. ASA given.  Final Clinical Impressions(s) / ED Diagnoses   Final diagnoses:  Demand ischemia ([place])  COPD exacerbation [facility])    New Prescriptions Current Discharge Medication List       Duffy Bruce, MD 09/17/16 2309

## 2016-09-17 NOTE — ED Notes (Signed)
CRITICAL VALUE ALERT  Critical value received:  troponin  Date of notification:  09/17/16  Time of notification: 1408  Critical value read back:yes  Nurse who received alert:  Marliss Coots  MD notified (1st page):  Isaacs  Time of first page:  1408

## 2016-09-18 DIAGNOSIS — J441 Chronic obstructive pulmonary disease with (acute) exacerbation: Secondary | ICD-10-CM | POA: Diagnosis not present

## 2016-09-18 LAB — CBC
HCT: 35.6 % — ABNORMAL LOW (ref 36.0–46.0)
HEMOGLOBIN: 11.6 g/dL — AB (ref 12.0–15.0)
MCH: 30.7 pg (ref 26.0–34.0)
MCHC: 32.6 g/dL (ref 30.0–36.0)
MCV: 94.2 fL (ref 78.0–100.0)
PLATELETS: 114 10*3/uL — AB (ref 150–400)
RBC: 3.78 MIL/uL — ABNORMAL LOW (ref 3.87–5.11)
RDW: 15.9 % — ABNORMAL HIGH (ref 11.5–15.5)
WBC: 4.4 10*3/uL (ref 4.0–10.5)

## 2016-09-18 LAB — GLUCOSE, CAPILLARY
GLUCOSE-CAPILLARY: 192 mg/dL — AB (ref 65–99)
Glucose-Capillary: 304 mg/dL — ABNORMAL HIGH (ref 65–99)
Glucose-Capillary: 181 mg/dL — ABNORMAL HIGH (ref 65–99)
Glucose-Capillary: 326 mg/dL — ABNORMAL HIGH (ref 65–99)

## 2016-09-18 LAB — BASIC METABOLIC PANEL
Anion gap: 9 (ref 5–15)
BUN: 22 mg/dL — ABNORMAL HIGH (ref 6–20)
CALCIUM: 9 mg/dL (ref 8.9–10.3)
CHLORIDE: 95 mmol/L — AB (ref 101–111)
CO2: 33 mmol/L — AB (ref 22–32)
CREATININE: 1.05 mg/dL — AB (ref 0.44–1.00)
GFR calc Af Amer: 57 mL/min — ABNORMAL LOW (ref >60)
GFR calc non Af Amer: 49 mL/min — ABNORMAL LOW (ref >60)
GLUCOSE: 193 mg/dL — AB (ref 65–99)
Potassium: 4.3 mmol/L (ref 3.5–5.1)
Sodium: 137 mmol/L (ref 135–145)

## 2016-09-18 LAB — TROPONIN I: Troponin I: <0.03 ng/mL (ref <0.03)

## 2016-09-18 MED ORDER — SODIUM CHLORIDE 0.9 % IV BOLUS (SEPSIS)
250.0000 mL | Freq: Once | INTRAVENOUS | Status: AC
  Administered 2016-09-18: 250 mL via INTRAVENOUS

## 2016-09-18 MED ORDER — ALPRAZOLAM 0.25 MG PO TABS
0.2500 mg | ORAL_TABLET | Freq: Every evening | ORAL | Status: DC | PRN
  Administered 2016-09-18: 0.25 mg via ORAL
  Filled 2016-09-18: qty 1

## 2016-09-18 NOTE — Progress Notes (Signed)
   09/18/16 1730  Vitals  BP 110/70 (post bolus)  BP Location Right Arm  BP Method Manual  Patient Position (if appropriate) Lying  Notified MD of BP after bolus.  The patient remained to alert and oriented and not verbalizing in complaints or concerns at this time.

## 2016-09-18 NOTE — Care Management Note (Signed)
Case Management Note  Patient Details  Name: Brooke Keller MRN: [id number] Date of Birth: 05-09-1935  Subjective/Objective:                  Pt admitted with COPD exacerbation. She is from home, lives with her husband and requires some assistance with bathing and dressing from husband.  Pt ambulate ind in her home but has cane and walker to use if she needs them. She has BiPAP, Oxygen and neb machine PTA. She has a PCP, her husband transports her to appointments and she has no difficulty affording or managing medications PTA.   Action/Plan: She plans to return home with self care at DC. CM will cont to follow for DC planning.   Expected Discharge Date:     09/19/2016             Expected Discharge Plan:  Home/Self Care  In-House Referral:  NA  Discharge planning Services  CM Consult  Post Acute Care Choice:  NA Choice offered to:  NA  Status of Service:  In process, will continue to follow  Sherald Barge, RN 09/18/2016, 3:00 PM

## 2016-09-18 NOTE — Plan of Care (Signed)
Discussed the patients care with her daughter with her permission.  Discussed the O2 sat monitor,  Medications, and her vital signs.  Family verbalized understanding.

## 2016-09-18 NOTE — Progress Notes (Signed)
Spoke with the daughters about bringing the patients lenalidomide in per pharm request since we do not have it her in the hospital they state that the med will me brought in tomorrow.

## 2016-09-18 NOTE — Care Management Obs Status (Signed)
[facility] NOTIFICATION   Patient Details  Name: Brooke Keller MRN: [id number] Date of Birth: 02/28/1935   Medicare Observation Status Notification Given:  Yes    Sherald Barge, RN 09/18/2016, 3:04 PM

## 2016-09-18 NOTE — Progress Notes (Signed)
PROGRESS NOTE    REI DRUMMONDS  [id number] DOB: 1935-03-05 DOA: 09/17/2016 PCP: Curlene Labrum, MD    Brief Narrative:  81 y.o. female with h/o COPD on CPAP qhs, MM on revlimid, DM coming in with acute SOB that began this afternoon. No URI symptoms, no sick contacts, no fevers/chills. Found to have poor air movement. CXR with chronic changes.   Assessment & Plan:   Principal Problem:   Acute on chronic respiratory failure (HCC) - Continue supplemental oxygen  Active Problems:   COPD with acute exacerbation (HCC) -Continue azithromycin. Respiratory condition slowly improving.  Chest pain - Troponin within normal limits - No chest pain reported today. Echocardiogram pending  Hypotension - question whether this is accurate read. Will fluid bolus - reassess blood pressures - For now will hold blood pressure medication while assessing   DVT prophylaxis: Lovenox Code Status: Full Family Communication: Discussed with patient and family at bedside Disposition Plan: Pending improvement in condition   Consultants:   None   Procedures: Echocardiogram pending   Antimicrobials: Azithromycin   Subjective: Patient has no new complaints. No acute issues overnight   Objective: Vitals:   09/18/16 0828 09/18/16 0829 09/18/16 1417 09/18/16 1424  BP:   (!) 74/45 (!) 78/46  Pulse:   76   Resp:   18   Temp:   98 F (36.7 C)   TempSrc:   Oral   SpO2: 96% 100% 100%   Weight:      Height:        Intake/Output Summary (Last 24 hours) at 09/18/16 1608 Last data filed at 09/18/16 0900  Gross per 24 hour  Intake              240 ml  Output                0 ml  Net              240 ml   Filed Weights   09/17/16 1146 09/17/16 1650  Weight: 57.2 kg (126 lb) 55.8 kg (123 lb)    Examination:  General exam: Appears calm and comfortable  Respiratory system: Clear to auscultation. Respiratory effort normal.Equal chest rise, prolonged expiratory phase, no wheezes    Cardiovascular system: S1 & S2 heard, RRR. No JVD, murmurs, rubs, gallops or clicks. No pedal edema. Gastrointestinal system: Abdomen is nondistended, soft and nontender. No organomegaly or masses felt. Normal bowel sounds heard. Central nervous system: Alert and oriented. No focal neurological deficits. Extremities: Symmetric 5 x 5 power. Skin: No rashes, lesions or ulcers Psychiatry: Judgement and insight appear normal. Mood & affect appropriate.     Data Reviewed: I have personally reviewed following labs and imaging studies  CBC:  Recent Labs Lab 09/17/16 1317 09/17/16 1808 09/18/16 0546  WBC 5.3 4.5 4.4  NEUTROABS 4.5  --   --   HGB 12.6 11.0* 11.6*  HCT 39.0 33.9* 35.6*  MCV 95.6 94.7 94.2  PLT 130* 112* [id number]*   Basic Metabolic Panel:  Recent Labs Lab 09/17/16 1317 09/17/16 1808 09/18/16 0546  NA 136  --  137  K 3.7  --  4.3  CL 92*  --  95*  CO2 35*  --  33*  GLUCOSE 218*  --  193*  BUN 19  --  22*  CREATININE 1.18* 1.17* 1.05*  CALCIUM 9.3  --  9.0   GFR: Estimated Creatinine Clearance: 29.3 mL/min (by C-G formula based on SCr of 1.05 mg/dL (H)). Liver  Function Tests:  Recent Labs Lab 09/17/16 1317  AST 20  ALT 23  ALKPHOS 57  BILITOT 0.8  PROT 7.5  ALBUMIN 4.0   No results for input(s): LIPASE, AMYLASE in the last 168 hours. No results for input(s): AMMONIA in the last 168 hours. Coagulation Profile: No results for input(s): INR, PROTIME in the last 168 hours. Cardiac Enzymes:  Recent Labs Lab 09/17/16 1317 09/17/16 1808 09/17/16 2310 09/18/16 0546  TROPONINI 0.03* <0.03 <0.03 <0.03   BNP (last 3 results) No results for input(s): PROBNP in the last 8760 hours. HbA1C: No results for input(s): HGBA1C in the last 72 hours. CBG:  Recent Labs Lab 09/17/16 2056 09/18/16 0744 09/18/16 1114  GLUCAP 272* 192* 304*   Lipid Profile: No results for input(s): CHOL, HDL, LDLCALC, TRIG, CHOLHDL, LDLDIRECT in the last 72 hours. Thyroid  Function Tests:  Recent Labs  09/17/16 1810  TSH 0.405   Anemia Panel: No results for input(s): VITAMINB12, FOLATE, FERRITIN, TIBC, IRON, RETICCTPCT in the last 72 hours. Sepsis Labs: No results for input(s): PROCALCITON, LATICACIDVEN in the last 168 hours.  No results found for this or any previous visit (from the past 240 hour(s)).       Radiology Studies: Dg Chest 2 View  Result Date: 09/17/2016 CLINICAL DATA:  Shortness of Breath EXAM: CHEST  2 VIEW COMPARISON:  09/01/2016 FINDINGS: Cardiomegaly again noted. Old bilateral rib fractures. Stable right IJ Port-A-Cath position. No infiltrate or pulmonary edema. Thoracic spine osteopenia. Prior vertebroplasty mid thoracic spine. Mild compression deformities lower thoracic spine of indeterminate age. IMPRESSION: No active cardiopulmonary disease. Cardiomegaly again noted. Stable right Port-A-Cath position. Electronically Signed   By: Lahoma Crocker M.D.   On: 09/17/2016 12:27        Scheduled Meds: . acyclovir  800 mg Oral BID  . arformoterol  15 mcg Nebulization Q12H  . aspirin EC  81 mg Oral q morning - 10a  . atorvastatin  10 mg Oral q morning - 10a  . azithromycin  500 mg Oral Daily  . brimonidine  1 drop Both Eyes TID  . dorzolamide-timolol  1 drop Both Eyes BID  . enoxaparin (LOVENOX) injection  30 mg Subcutaneous Q24H  . furosemide  40 mg Oral BID  . insulin aspart  0-15 Units Subcutaneous TID WC  . insulin aspart  0-5 Units Subcutaneous QHS  . insulin aspart  4 Units Subcutaneous TID WC  . latanoprost  1 drop Both Eyes QHS  . [START ON 09/19/2016] lenalidomide  10 mg Oral QODAY  . losartan  50 mg Oral q morning - 10a  . methylPREDNISolone (SOLU-MEDROL) injection  80 mg Intravenous Q6H  . potassium chloride SA  20 mEq Oral BID  . sodium chloride  250 mL Intravenous Once  . sodium chloride flush  3 mL Intravenous Q12H  . tiotropium  18 mcg Inhalation Daily   Continuous Infusions:   LOS: 0 days    Time spent: > 35  minutes  Velvet Bathe, MD [facility] Pager [phone]  If 7PM-7AM, please contact night-coverage [email or web address] Password [email or web address] 09/18/2016, 4:08 PM

## 2016-09-19 ENCOUNTER — Ambulatory Visit (HOSPITAL_COMMUNITY): Admission: RE | Admit: 2016-09-19 | Payer: Medicare Other | Source: Ambulatory Visit

## 2016-09-19 DIAGNOSIS — J441 Chronic obstructive pulmonary disease with (acute) exacerbation: Secondary | ICD-10-CM | POA: Diagnosis not present

## 2016-09-19 LAB — HEMOGLOBIN A1C
Hgb A1c MFr Bld: 6.2 % — ABNORMAL HIGH (ref 4.8–5.6)
Mean Plasma Glucose: 131 mg/dL

## 2016-09-19 LAB — GLUCOSE, CAPILLARY
Glucose-Capillary: 194 mg/dL — ABNORMAL HIGH (ref 65–99)
Glucose-Capillary: 258 mg/dL — ABNORMAL HIGH (ref 65–99)

## 2016-09-19 MED ORDER — ALPRAZOLAM 0.25 MG PO TABS: 0.2500 mg | ORAL_TABLET | Freq: Every evening | ORAL | Status: DC | PRN

## 2016-09-19 MED ORDER — AZITHROMYCIN 250 MG PO TABS: ORAL_TABLET | ORAL | 0 refills | Status: DC

## 2016-09-19 MED ORDER — ALPRAZOLAM 0.25 MG PO TABS: 1.0000 mg | ORAL_TABLET | Freq: Every evening | ORAL | Status: DC | PRN

## 2016-09-19 NOTE — Discharge Summary (Addendum)
Physician Discharge Summary  Brooke Keller [id number] DOB: 06-23-1935 DOA: 09/17/2016  PCP: Curlene Labrum, MD  Admit date: 09/17/2016 Discharge date: 09/19/2016  Time spent: > 35 minutes  Recommendations for Outpatient Follow-up:  1. Please be sure to follow up with your primary care physician in 1-2 weeks or sooner should any new concerns arise. 2. Please monitor serum creatinine if 1.4 or above discontinue metformin   Discharge Diagnoses:  Principal Problem:   Acute on chronic respiratory failure (HCC) Active Problems:   COPD (chronic obstructive pulmonary disease) (HCC)   COPD with acute exacerbation (HCC)   Elevated troponin   Discharge Condition: stable  Diet recommendation: Carb modified diet  Filed Weights   09/17/16 1146 09/17/16 1650  Weight: 57.2 kg (126 lb) 55.8 kg (123 lb)    History of present illness:  81 y.o. female with h/o COPD on CPAP qhs, MM on revlimid, DM coming in with acute SOB that began this afternoon. No URI symptoms, no sick contacts, no fevers/chills. Found to have poor air movement. CXR with chronic changes.  Hospital Course:  COPD exacerbation - improved with steroids and antibiotics - Will d/c on short course of antibiotics and home steroid regimen  Otherwise we'll continue home medication regimen  Procedures:  None  Consultations:  None  Discharge Exam: Vitals:   09/18/16 2038 09/19/16 0538  BP: 98/64 (!) 102/58  Pulse: 88 61  Resp: 18 18  Temp: 98 F (36.7 C) 98.2 F (36.8 C)    General: Pt in nad, alert and awake Cardiovascular: rrr, no rubs Respiratory: no increased wob, no wheezes  Discharge Instructions   Discharge Instructions    Call MD for:  severe uncontrolled pain    Complete by:  As directed    Call MD for:  temperature >100.4    Complete by:  As directed    Diet - low sodium heart healthy    Complete by:  As directed    Increase activity slowly    Complete by:  As directed      Current  Discharge Medication List    START taking these medications   Details  azithromycin (ZITHROMAX) 250 MG tablet Take 1 tab by mouth daily Qty: 2 each, Refills: 0      CONTINUE these medications which have CHANGED   Details  ALPRAZolam (XANAX) 0.25 MG tablet Take 1 tablet (0.25 mg total) by mouth at bedtime as needed for sleep. Qty: 30 tablet   Associated Diagnoses: Generalized anxiety disorder      CONTINUE these medications which have NOT CHANGED   Details  acyclovir (ZOVIRAX) 800 MG tablet Take 800 mg by mouth 2 (two) times daily.    aspirin EC 81 MG tablet Take 81 mg by mouth every morning.     atorvastatin (LIPITOR) 10 MG tablet Take 10 mg by mouth every morning.     brimonidine (ALPHAGAN) 0.2 % ophthalmic solution INSTILL ONE DROP IN [facility] EYE THREE TIMES DAILY Refills: 99    calcium-vitamin D (OSCAL WITH D) 500-200 MG-UNIT per tablet Take 1 tablet by mouth 2 (two) times daily.    dorzolamide-timolol (COSOPT) 22.3-6.8 MG/ML ophthalmic solution Place 1 drop into both eyes 2 (two) times daily. Refills: 99    furosemide (LASIX) 40 MG tablet TAKE ONE TABLET BY MOUTH IN THE MORNING, TAKE 1/2 TABLET IN THE AFTERNOON AND TAKE 1/2 TABLET AT NIGHT Refills: 3    ipratropium-albuterol (DUONEB) 0.5-2.5 (3) MG/3ML SOLN Take 3 mLs by nebulization 2 (two) times  daily.      lenalidomide (REVLIMID) 10 MG capsule Take 1 capsule (10 mg total) by mouth every other day. Qty: 14 capsule, Refills: 1   Associated Diagnoses: IgG multiple myeloma (HCC)    LUMIGAN 0.01 % SOLN INSTILL ONE DROP IN [facility] EYE AT BEDTIME Refills: 99    metFORMIN (GLUCOPHAGE) 500 MG tablet Take 500 mg by mouth daily with breakfast.    PERFOROMIST 20 MCG/2ML nebulizer solution Inhale 20 mcg into the lungs 2 (two) times daily.     potassium chloride SA (K-DUR,KLOR-CON) 20 MEQ tablet Take 20 mEq by mouth 2 (two) times daily. Refills: 2    predniSONE (DELTASONE) 10 MG tablet Take 10 mg by mouth daily.      HYDROcodone-acetaminophen (NORCO) 10-325 MG tablet Take 1 tablet by mouth every 4 (four) hours as needed. for pain Qty: 90 tablet, Refills: 0   Associated Diagnoses: Pain; Cancer associated pain    nystatin cream (MYCOSTATIN) APPLY TO THE AFFECTED AREA AS NEEDED IRRITATION Refills: 1      STOP taking these medications     losartan (COZAAR) 50 MG tablet        Allergies  Allergen Reactions  . Ativan [Lorazepam] Other (See Comments)    hallucinations      The results of significant diagnostics from this hospitalization (including imaging, microbiology, ancillary and laboratory) are listed below for reference.    Significant Diagnostic Studies: Dg Chest 2 View  Result Date: 09/17/2016 CLINICAL DATA:  Shortness of Breath EXAM: CHEST  2 VIEW COMPARISON:  09/01/2016 FINDINGS: Cardiomegaly again noted. Old bilateral rib fractures. Stable right IJ Port-A-Cath position. No infiltrate or pulmonary edema. Thoracic spine osteopenia. Prior vertebroplasty mid thoracic spine. Mild compression deformities lower thoracic spine of indeterminate age. IMPRESSION: No active cardiopulmonary disease. Cardiomegaly again noted. Stable right Port-A-Cath position. Electronically Signed   By: Lahoma Crocker M.D.   On: 09/17/2016 12:27    Microbiology: No results found for this or any previous visit (from the past 240 hour(s)).   Labs: Basic Metabolic Panel:  Recent Labs Lab 09/17/16 1317 09/17/16 1808 09/18/16 0546  NA 136  --  137  K 3.7  --  4.3  CL 92*  --  95*  CO2 35*  --  33*  GLUCOSE 218*  --  193*  BUN 19  --  22*  CREATININE 1.18* 1.17* 1.05*  CALCIUM 9.3  --  9.0   Liver Function Tests:  Recent Labs Lab 09/17/16 1317  AST 20  ALT 23  ALKPHOS 57  BILITOT 0.8  PROT 7.5  ALBUMIN 4.0   No results for input(s): LIPASE, AMYLASE in the last 168 hours. No results for input(s): AMMONIA in the last 168 hours. CBC:  Recent Labs Lab 09/17/16 1317 09/17/16 1808 09/18/16 0546   WBC 5.3 4.5 4.4  NEUTROABS 4.5  --   --   HGB 12.6 11.0* 11.6*  HCT 39.0 33.9* 35.6*  MCV 95.6 94.7 94.2  PLT 130* 112* 114*   Cardiac Enzymes:  Recent Labs Lab 09/17/16 1317 09/17/16 1808 09/17/16 2310 09/18/16 0546  TROPONINI 0.03* <0.03 <0.03 <0.03   BNP: BNP (last 3 results)  Recent Labs  09/17/16 1317  BNP 187.0*    ProBNP (last 3 results) No results for input(s): PROBNP in the last 8760 hours.  CBG:  Recent Labs Lab 09/18/16 1114 09/18/16 1703 09/18/16 2037 09/19/16 0720 09/19/16 1134  GLUCAP 304* 181* 326* 194* 258*    Signed:  Velvet Bathe MD.  [facility] 09/19/2016, 1:35 PM   Addendum:  Chest pain - Troponin x 3 negative - no chest pain reported on day of d/c

## 2016-09-19 NOTE — Care Management Note (Signed)
Case Management Note  Patient Details  Name: LETTICIA TUZZOLINO MRN: [id number] Date of Birth: Mar 25, 1935  Expected Discharge Date:       09/20/2016           Expected Discharge Plan:  Home/Self Care  In-House Referral:  NA  Discharge planning Services  CM Consult  Post Acute Care Choice:  NA Choice offered to:  NA  Status of Service:  Completed, signed off  Additional Comments: Pt discharging home today with self care. Family at bedside. No CM needs anticipated. Pt has supplemental oxygen at home and will have port tank for transport home.   Sherald Barge, RN 09/19/2016, 2:56 PM

## 2016-09-19 NOTE — Progress Notes (Signed)
Inpatient Diabetes Program Recommendations  AACE/ADA: New Consensus Statement on Inpatient Glycemic Control (2015)  Target Ranges:  Prepandial:   less than 140 mg/dL      Peak postprandial:   less than 180 mg/dL (1-2 hours)      Critically ill patients:  140 - 180 mg/dL   Results for Brooke Keller, Brooke Keller (MRN [id number]) as of 09/19/2016 08:29  Ref. Range 09/18/2016 07:44 09/18/2016 11:14 09/18/2016 17:03 09/18/2016 20:37 09/19/2016 07:20  Glucose-Capillary Latest Ref Range: 65 - 99 mg/dL 192 (H) 304 (H) 181 (H) 326 (H) 194 (H)   Review of Glycemic Control  Diabetes history:DM2 Outpatient Diabetes medications: Metformin 500 mg QAM Current orders for Inpatient glycemic control: Novolog 0-15 units TID with meals, Novolog 0-5 units QHS, Novolog 4 units TID with meals for meal coverage  Inpatient Diabetes Program Recommendations: Insulin - Basal: If steroids are continued as ordered (Solumedrol 80 mg Q6H), please consider ordering Lantus 6 units Q24H (based on 55.8 kg x 0.1 units).  Thanks, Barnie Alderman, RN, MSN, CDE Diabetes Coordinator Inpatient Diabetes Program [phone] (Team Pager from 8am to 5pm)

## 2016-09-19 NOTE — Progress Notes (Signed)
Patient discharged with instructions, prescription, and care notes.  Verbalized understanding via teach back.  IV was removed and the site was WNL. Patient voiced no further complaints or concerns at the time of discharge.  Appointments scheduled per instructions.  Patient left the floor via w/c family  And staff in stable condition.   Dressing on left arm changed prior to discharge.  The skin was dry and intact just a lot of bruising.

## 2016-10-02 DIAGNOSIS — J449 Chronic obstructive pulmonary disease, unspecified: Secondary | ICD-10-CM | POA: Diagnosis not present

## 2016-10-04 ENCOUNTER — Telehealth (HOSPITAL_COMMUNITY): Payer: Self-pay

## 2016-10-04 NOTE — Telephone Encounter (Signed)
Left message on voicemail for patient to call pharmacy and have them send a request of call her PCP. We do not know what "cough med" patient is talking about because she is a transfer from [facility].

## 2016-10-04 NOTE — Telephone Encounter (Signed)
-----   Message from Baird Cancer, PA-C sent at 10/03/2016  4:58 PM EST ----- She is gonna have to call her pharmacy to find out what that is and call her primary care provider for that Rx.  I don't know what it is because she is a transfer from [place].  TK ----- Message ----- From: Epifanio Lesches Sent: 10/03/2016  10:31 AM To: Baird Cancer, PA-C, Onc Nurse Ap  Pt wants a script for the "cough med that she gets every year"

## 2016-10-04 NOTE — Telephone Encounter (Signed)
Returned patient phone call and informed them that they need to see her PCP for her cough and cold issues per Kirby Crigler PA-C.

## 2016-10-04 NOTE — Telephone Encounter (Signed)
-----   Message from Epifanio Lesches sent at 10/03/2016 10:31 AM EST ----- Pt wants a script for the "cough med that she gets every year"

## 2016-10-08 ENCOUNTER — Encounter (HOSPITAL_COMMUNITY): Payer: Medicare Other | Attending: Oncology

## 2016-10-08 ENCOUNTER — Other Ambulatory Visit (HOSPITAL_COMMUNITY): Payer: Self-pay | Admitting: Oncology

## 2016-10-08 ENCOUNTER — Encounter (HOSPITAL_COMMUNITY): Payer: Medicare Other

## 2016-10-08 VITALS — BP 130/67 | HR 81 | Temp 98.4°F | Resp 18

## 2016-10-08 DIAGNOSIS — G893 Neoplasm related pain (acute) (chronic): Secondary | ICD-10-CM

## 2016-10-08 DIAGNOSIS — C9000 Multiple myeloma not having achieved remission: Secondary | ICD-10-CM | POA: Diagnosis not present

## 2016-10-08 DIAGNOSIS — E538 Deficiency of other specified B group vitamins: Secondary | ICD-10-CM

## 2016-10-08 DIAGNOSIS — R52 Pain, unspecified: Secondary | ICD-10-CM

## 2016-10-08 LAB — CBC WITH DIFFERENTIAL/PLATELET
Basophils Absolute: 0.1 10*3/uL (ref 0.0–0.1)
Basophils Relative: 1 %
EOS ABS: 0.1 10*3/uL (ref 0.0–0.7)
EOS PCT: 3 %
HCT: 33.8 % — ABNORMAL LOW (ref 36.0–46.0)
Hemoglobin: 11.3 g/dL — ABNORMAL LOW (ref 12.0–15.0)
LYMPHS ABS: 0.5 10*3/uL — AB (ref 0.7–4.0)
LYMPHS PCT: 11 %
MCH: 32.3 pg (ref 26.0–34.0)
MCHC: 33.4 g/dL (ref 30.0–36.0)
MCV: 96.6 fL (ref 78.0–100.0)
MONO ABS: 0.4 10*3/uL (ref 0.1–1.0)
MONOS PCT: 8 %
Neutro Abs: 3.5 10*3/uL (ref 1.7–7.7)
Neutrophils Relative %: 77 %
PLATELETS: 122 10*3/uL — AB (ref 150–400)
RBC: 3.5 MIL/uL — AB (ref 3.87–5.11)
RDW: 16.8 % — AB (ref 11.5–15.5)
WBC: 4.5 10*3/uL (ref 4.0–10.5)

## 2016-10-08 LAB — COMPREHENSIVE METABOLIC PANEL
ALT: 22 U/L (ref 14–54)
ANION GAP: 11 (ref 5–15)
AST: 24 U/L (ref 15–41)
Albumin: 3.8 g/dL (ref 3.5–5.0)
Alkaline Phosphatase: 53 U/L (ref 38–126)
BUN: 19 mg/dL (ref 6–20)
CHLORIDE: 93 mmol/L — AB (ref 101–111)
CO2: 32 mmol/L (ref 22–32)
CREATININE: 1.26 mg/dL — AB (ref 0.44–1.00)
Calcium: 8.9 mg/dL (ref 8.9–10.3)
GFR, EST AFRICAN AMERICAN: 46 mL/min — AB (ref >60)
GFR, EST NON AFRICAN AMERICAN: 39 mL/min — AB (ref >60)
Glucose, Bld: 293 mg/dL — ABNORMAL HIGH (ref 65–99)
POTASSIUM: 3.8 mmol/L (ref 3.5–5.1)
SODIUM: 136 mmol/L (ref 135–145)
Total Bilirubin: 0.9 mg/dL (ref 0.3–1.2)
Total Protein: 6.8 g/dL (ref 6.5–8.1)

## 2016-10-08 LAB — VITAMIN B12: VITAMIN B 12: 606 pg/mL (ref 180–914)

## 2016-10-08 MED ORDER — SODIUM CHLORIDE 0.9 % IV SOLN
Freq: Once | INTRAVENOUS | Status: AC
  Administered 2016-10-08: 14:00:00 via INTRAVENOUS

## 2016-10-08 MED ORDER — ZOLEDRONIC ACID 4 MG/5ML IV CONC
3.3000 mg | Freq: Once | INTRAVENOUS | Status: AC
  Administered 2016-10-08: 3.3 mg via INTRAVENOUS
  Filled 2016-10-08: qty 4.13

## 2016-10-08 MED ORDER — HYDROCODONE-ACETAMINOPHEN 10-325 MG PO TABS: 1.0000 | ORAL_TABLET | ORAL | 0 refills | Status: DC | PRN

## 2016-10-08 MED ORDER — HEPARIN SOD (PORK) LOCK FLUSH 100 UNIT/ML IV SOLN
500.0000 [IU] | Freq: Once | INTRAVENOUS | Status: AC
  Administered 2016-10-08: 500 [IU] via INTRAVENOUS

## 2016-10-08 NOTE — Patient Instructions (Signed)
[place] at [facility] Discharge Instructions  RECOMMENDATIONS MADE BY THE CONSULTANT AND ANY TEST RESULTS WILL BE SENT TO YOUR REFERRING PHYSICIAN.  Zometa infusion today. Return as scheduled for infusions. Return as scheduled for lab work and office visit.   Thank you for choosing [place] at [facility] to provide your oncology and hematology care.  To afford each patient quality time with our provider, please arrive at least 15 minutes before your scheduled appointment time.   Beginning January 23rd 2017 lab work for the [facility] will be done in the  Main lab at [facility] on 1st floor. If you have a lab appointment with the [facility] please come in thru the  Main Entrance and check in at the main information desk  You need to re-schedule your appointment should you arrive 10 or more minutes late.  We strive to give you quality time with our providers, and arriving late affects you and other patients whose appointments are after yours.  Also, if you no show three or more times for appointments you may be dismissed from the clinic at the providers discretion.     Again, thank you for choosing [facility].  Our hope is that these requests will decrease the amount of time that you wait before being seen by our physicians.       _____________________________________________________________  Should you have questions after your visit to [facility], please contact our office at [phone] between the hours of 8:30 a.m. and 4:30 p.m.  Voicemails left after 4:30 p.m. will not be returned until the following business day.  For prescription refill requests, have your pharmacy contact our office.         Resources For Cancer Patients and their Caregivers ? American Cancer Society: Can assist with transportation, wigs, general needs, runs Look Good Feel Better.        (202)563-4676 ? Cancer  Care: Provides financial assistance, online support groups, medication/co-pay assistance.  1-800-813-HOPE 479-888-3477) ? [facility] Assists [place] cancer patients and their families through emotional , educational and financial support.  252-297-1012 ? [place] DSS Where to apply for food stamps, Medicaid and utility assistance. 507 880 3044 ? RCATS: Transportation to medical appointments. 225-097-6512 ? Social Security Administration: May apply for disability if have a Stage IV cancer. 6813208397 (731)688-5072 ? [employer], Disability and Transit Services: Assists with nutrition, care and transit needs. [place] Support Programs: @10RELATIVEDAYS @ > Cancer Support Group  2nd Tuesday of the month 1pm-2pm, Journey Room  > Creative Journey  3rd Tuesday of the month 1130am-1pm, Journey Room  > Look Good Feel Better  1st Wednesday of the month 10am-12 noon, Journey Room (Call [place] to register [phone])

## 2016-10-08 NOTE — Progress Notes (Signed)
Tolerated infusion w/o adverse reaction.  Alert, in no distress.  VSS.  Discharged in c/o family via wheelchair.

## 2016-10-09 LAB — IMMUNOFIXATION ELECTROPHORESIS
IGA: 142 mg/dL (ref 64–422)
IGM, SERUM: 40 mg/dL (ref 26–217)
IgG (Immunoglobin G), Serum: 635 mg/dL — ABNORMAL LOW (ref 700–1600)
Total Protein ELP: 6.4 g/dL (ref 6.0–8.5)

## 2016-10-09 LAB — KAPPA/LAMBDA LIGHT CHAINS
KAPPA FREE LGHT CHN: 31.5 mg/L — AB (ref 3.3–19.4)
Kappa, lambda light chain ratio: 1.08 (ref 0.26–1.65)
LAMDA FREE LIGHT CHAINS: 29.1 mg/L — AB (ref 5.7–26.3)

## 2016-10-10 LAB — PROTEIN ELECTROPHORESIS, SERUM
A/G RATIO SPE: 1.2 (ref 0.7–1.7)
ALBUMIN ELP: 3.4 g/dL (ref 2.9–4.4)
ALPHA-1-GLOBULIN: 0.3 g/dL (ref 0.0–0.4)
ALPHA-2-GLOBULIN: 0.9 g/dL (ref 0.4–1.0)
BETA GLOBULIN: 1 g/dL (ref 0.7–1.3)
GLOBULIN, TOTAL: 2.8 g/dL (ref 2.2–3.9)
Gamma Globulin: 0.7 g/dL (ref 0.4–1.8)
Total Protein ELP: 6.2 g/dL (ref 6.0–8.5)

## 2016-10-11 DIAGNOSIS — G4733 Obstructive sleep apnea (adult) (pediatric): Secondary | ICD-10-CM | POA: Diagnosis not present

## 2016-10-11 DIAGNOSIS — R0602 Shortness of breath: Secondary | ICD-10-CM | POA: Diagnosis not present

## 2016-10-11 DIAGNOSIS — R0689 Other abnormalities of breathing: Secondary | ICD-10-CM | POA: Diagnosis not present

## 2016-10-11 DIAGNOSIS — J9620 Acute and chronic respiratory failure, unspecified whether with hypoxia or hypercapnia: Secondary | ICD-10-CM | POA: Diagnosis not present

## 2016-10-11 DIAGNOSIS — Z87891 Personal history of nicotine dependence: Secondary | ICD-10-CM | POA: Diagnosis not present

## 2016-10-15 ENCOUNTER — Other Ambulatory Visit (HOSPITAL_COMMUNITY): Payer: Self-pay | Admitting: Oncology

## 2016-10-15 ENCOUNTER — Other Ambulatory Visit (HOSPITAL_COMMUNITY): Payer: Self-pay | Admitting: *Deleted

## 2016-10-15 DIAGNOSIS — C9000 Multiple myeloma not having achieved remission: Secondary | ICD-10-CM

## 2016-10-15 MED ORDER — LENALIDOMIDE 10 MG PO CAPS: 10.0000 mg | ORAL_CAPSULE | ORAL | 1 refills | Status: DC

## 2016-10-17 ENCOUNTER — Encounter (HOSPITAL_COMMUNITY): Payer: Self-pay | Admitting: Hematology & Oncology

## 2016-10-17 ENCOUNTER — Other Ambulatory Visit (HOSPITAL_COMMUNITY): Payer: Self-pay | Admitting: Oncology

## 2016-10-17 ENCOUNTER — Other Ambulatory Visit (HOSPITAL_COMMUNITY): Payer: Self-pay

## 2016-10-17 ENCOUNTER — Encounter (HOSPITAL_COMMUNITY): Payer: Medicare Other | Attending: Hematology & Oncology | Admitting: Hematology & Oncology

## 2016-10-17 DIAGNOSIS — J449 Chronic obstructive pulmonary disease, unspecified: Secondary | ICD-10-CM

## 2016-10-17 DIAGNOSIS — E538 Deficiency of other specified B group vitamins: Secondary | ICD-10-CM | POA: Diagnosis not present

## 2016-10-17 DIAGNOSIS — C9000 Multiple myeloma not having achieved remission: Secondary | ICD-10-CM | POA: Diagnosis not present

## 2016-10-17 DIAGNOSIS — Z452 Encounter for adjustment and management of vascular access device: Secondary | ICD-10-CM

## 2016-10-17 DIAGNOSIS — G893 Neoplasm related pain (acute) (chronic): Secondary | ICD-10-CM

## 2016-10-17 DIAGNOSIS — F419 Anxiety disorder, unspecified: Secondary | ICD-10-CM

## 2016-10-17 DIAGNOSIS — F411 Generalized anxiety disorder: Secondary | ICD-10-CM

## 2016-10-17 DIAGNOSIS — R52 Pain, unspecified: Secondary | ICD-10-CM

## 2016-10-17 DIAGNOSIS — Z87891 Personal history of nicotine dependence: Secondary | ICD-10-CM | POA: Insufficient documentation

## 2016-10-17 LAB — COMPREHENSIVE METABOLIC PANEL
ALT: 20 U/L (ref 14–54)
AST: 20 U/L (ref 15–41)
Albumin: 4.2 g/dL (ref 3.5–5.0)
Alkaline Phosphatase: 51 U/L (ref 38–126)
Anion gap: 10 (ref 5–15)
BILIRUBIN TOTAL: 1 mg/dL (ref 0.3–1.2)
BUN: 20 mg/dL (ref 6–20)
CHLORIDE: 93 mmol/L — AB (ref 101–111)
CO2: 32 mmol/L (ref 22–32)
CREATININE: 1.37 mg/dL — AB (ref 0.44–1.00)
Calcium: 9.5 mg/dL (ref 8.9–10.3)
GFR, EST AFRICAN AMERICAN: 41 mL/min — AB (ref >60)
GFR, EST NON AFRICAN AMERICAN: 36 mL/min — AB (ref >60)
Glucose, Bld: 163 mg/dL — ABNORMAL HIGH (ref 65–99)
Potassium: 3.8 mmol/L (ref 3.5–5.1)
Sodium: 135 mmol/L (ref 135–145)
TOTAL PROTEIN: 7.3 g/dL (ref 6.5–8.1)

## 2016-10-17 LAB — CBC WITH DIFFERENTIAL/PLATELET
BASOS ABS: 0 10*3/uL (ref 0.0–0.1)
Basophils Relative: 1 %
EOS PCT: 2 %
Eosinophils Absolute: 0.1 10*3/uL (ref 0.0–0.7)
HEMATOCRIT: 35.3 % — AB (ref 36.0–46.0)
Hemoglobin: 11.9 g/dL — ABNORMAL LOW (ref 12.0–15.0)
LYMPHS ABS: 0.6 10*3/uL — AB (ref 0.7–4.0)
LYMPHS PCT: 9 %
MCH: 32.3 pg (ref 26.0–34.0)
MCHC: 33.7 g/dL (ref 30.0–36.0)
MCV: 95.9 fL (ref 78.0–100.0)
MONO ABS: 0.6 10*3/uL (ref 0.1–1.0)
Monocytes Relative: 9 %
NEUTROS ABS: 5 10*3/uL (ref 1.7–7.7)
Neutrophils Relative %: 79 %
PLATELETS: 173 10*3/uL (ref 150–400)
RBC: 3.68 MIL/uL — AB (ref 3.87–5.11)
RDW: 17 % — ABNORMAL HIGH (ref 11.5–15.5)
WBC: 6.3 10*3/uL (ref 4.0–10.5)

## 2016-10-17 MED ORDER — SODIUM CHLORIDE 0.9% FLUSH
10.0000 mL | INTRAVENOUS | Status: DC | PRN
  Administered 2016-10-17: 10 mL via INTRAVENOUS
  Filled 2016-10-17: qty 10

## 2016-10-17 MED ORDER — HYDROCODONE-ACETAMINOPHEN 10-325 MG PO TABS: 1.0000 | ORAL_TABLET | ORAL | 0 refills | Status: DC | PRN

## 2016-10-17 MED ORDER — LENALIDOMIDE 10 MG PO CAPS: 10.0000 mg | ORAL_CAPSULE | ORAL | 1 refills | Status: DC

## 2016-10-17 MED ORDER — ALPRAZOLAM 1 MG PO TABS: 1.0000 mg | ORAL_TABLET | Freq: Two times a day (BID) | ORAL | 3 refills | Status: DC | PRN

## 2016-10-17 MED ORDER — ALPRAZOLAM 0.25 MG PO TABS: 0.2500 mg | ORAL_TABLET | Freq: Two times a day (BID) | ORAL | 3 refills | Status: DC

## 2016-10-17 MED ORDER — HEPARIN SOD (PORK) LOCK FLUSH 100 UNIT/ML IV SOLN
500.0000 [IU] | Freq: Once | INTRAVENOUS | Status: AC
  Administered 2016-10-17: 500 [IU] via INTRAVENOUS

## 2016-10-17 NOTE — Patient Instructions (Addendum)
[place] at [facility] Discharge Instructions  RECOMMENDATIONS MADE BY THE CONSULTANT AND ANY TEST RESULTS WILL BE SENT TO YOUR REFERRING PHYSICIAN.  You were seen today by Dr. Whitney Muse  Return to clinic in 3 months: SPEP/IEP, CBC diff, CMP, light chains  Zometa every 3 months  Port flushes every 6 weeks    Thank you for choosing [place] at [facility] to provide your oncology and hematology care.  To afford each patient quality time with our provider, please arrive at least 15 minutes before your scheduled appointment time.    If you have a lab appointment with the [facility] please come in thru the  Main Entrance and check in at the main information desk  You need to re-schedule your appointment should you arrive 10 or more minutes late.  We strive to give you quality time with our providers, and arriving late affects you and other patients whose appointments are after yours.  Also, if you no show three or more times for appointments you may be dismissed from the clinic at the providers discretion.     Again, thank you for choosing [facility].  Our hope is that these requests will decrease the amount of time that you wait before being seen by our physicians.       _____________________________________________________________  Should you have questions after your visit to [facility], please contact our office at [phone] between the hours of 8:30 a.m. and 4:30 p.m.  Voicemails left after 4:30 p.m. will not be returned until the following business day.  For prescription refill requests, have your pharmacy contact our office.       Resources For Cancer Patients and their Caregivers ? American Cancer Society: Can assist with transportation, wigs, general needs, runs Look Good Feel Better.        929-569-4367 ? Cancer Care: Provides financial assistance, online support groups, medication/co-pay  assistance.  1-800-813-HOPE 725-065-9197) ? [place] [facility] Assists [place] cancer patients and their families through emotional , educational and financial support.  (930) 258-7543 ? [place] DSS Where to apply for food stamps, Medicaid and utility assistance. 419 356 7442 ? RCATS: Transportation to medical appointments. 858-659-4091 ? Social Security Administration: May apply for disability if have a Stage IV cancer. 509-553-5685 7066642160 ? [employer], Disability and Transit Services: Assists with nutrition, care and transit needs. [place] Support Programs: @10RELATIVEDAYS @ > Cancer Support Group  2nd Tuesday of the month 1pm-2pm, Journey Room  > Creative Journey  3rd Tuesday of the month 1130am-1pm, Journey Room  > Look Good Feel Better  1st Wednesday of the month 10am-12 noon, Journey Room (Call [place] to register [phone])

## 2016-10-17 NOTE — Progress Notes (Signed)
Brooke Keller presented for Portacath access and flush. Portacath located right chest wall accessed with  H 20 needle. Good blood return present. Portacath flushed with 73ml NS and 500U/67ml Heparin and needle removed intact. Procedure without incident. Patient tolerated procedure well. Labs drawn per orders.

## 2016-10-17 NOTE — Progress Notes (Signed)
[facility]  Progress Note  Patient Care Team: Curlene Labrum, MD as PCP - General (Family Medicine)  CHIEF COMPLAINTS/PURPOSE OF CONSULTATION:  IgG Multiple myeloma Revlimid 10 mg po qod Zometa Q 3 months Transfer from [facility]  HISTORY OF PRESENTING ILLNESS:  Brooke Keller 82 y.o. female is here because of referral from [facility] to establish care of multiple myeloma.  Brooke Keller is a pleasant 82 y.o. with IgG multiple myeloma who presents to the clinic with female and daughter. Patient is on O2 by nasal canula.  Since our last visit the patient was hospitalized from 09/17/2016 to 09/19/2016 for  Demand ischemia.   She denies anymore chest pain since her hospitalization.  States not eating well- some days eating well some days not eating well, it is not a new problem. She used to drink Ensure. Her daughter plans to buy her more.    Patient states they changed her xanax amount while she was in the hospital. Patient plans to stop seeing her PCP "because he pissed me off". She notes that she is very direct and that is just "the way I am."   She denies issues getting her Revlimid. She is requesting 3 month refills on her pain medication, because it is hard for her to come in and her husband is sick too.        MEDICAL HISTORY:  Past Medical History:  Diagnosis Date  . COPD (chronic obstructive pulmonary disease) ([place])   . Diabetes mellitus   . GERD (gastroesophageal reflux disease)   . Hypertension   . IgG multiple myeloma ([place]) 04/14/2016  . Multiple myeloma     SURGICAL HISTORY: Past Surgical History:  Procedure Laterality Date  . carpal tunne    . KYPHOPLASTY    . right fifth toe      SOCIAL HISTORY: Social History   Social History  . Marital status: Married    Spouse name: Arnell Sieving  . Number of children: N/A  . Years of education: N/A   Occupational History  . Not on file.   Social History Main Topics  . Smoking status: Former Smoker    Quit  date: 09/30/2007  . Smokeless tobacco: Former Systems developer    Quit date: 09/30/2006  . Alcohol use No  . Drug use: No  . Sexual activity: Yes    Partners: Female   Other Topics Concern  . Not on file   Social History Narrative  . 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 No pertinent family history.  Mother deceased 22 yo. Believes she had Alzheimer's Father deceased 94 yo had congestive heart failure. 11 siblings. 6 have passed. She doesn't know of any with cancer. She is not aware of any family history of myeloma.  ALLERGIES:  is allergic to ativan [lorazepam].  MEDICATIONS:  Current Outpatient Prescriptions  Medication Sig Dispense Refill  . acyclovir (ZOVIRAX) 800 MG tablet Take 800 mg by mouth 2 (two) times daily.    Marland Kitchen ALPRAZolam (XANAX) 0.25 MG tablet Take 1 tablet (0.25 mg total) by mouth 2 (two) times daily. 60 tablet 3  . ALPRAZolam (XANAX) 1 MG tablet Take 1 tablet (1 mg  total) by mouth 2 (two) times daily as needed for anxiety. 60 tablet 3  . aspirin EC 81 MG tablet Take 81 mg by mouth every morning.     Marland Kitchen atorvastatin (LIPITOR) 10 MG tablet Take 10 mg by mouth every morning.     Marland Kitchen azithromycin (ZITHROMAX) 250 MG tablet Take 1 tab by mouth daily 2 each 0  . benzonatate (TESSALON) 100 MG capsule TAKE ONE CAPSULE BY MOUTH UP TO THREE TIMES DAILY AS NEEDED FOR COUGH  0  . brimonidine (ALPHAGAN) 0.2 % ophthalmic solution INSTILL ONE DROP IN EACH EYE THREE TIMES DAILY  99  . Calcium Carbonate-Vitamin D 600-400 MG-UNIT tablet Take 1 tablet by mouth 2 (two) times daily.  4  . calcium-vitamin D (OSCAL WITH D) 500-200 MG-UNIT per tablet Take 1 tablet by mouth 2 (two) times daily.    Marland Kitchen  CHERATUSSIN AC 100-10 MG/5ML syrup TAKE ONE TEASPOONFUL (5ML) BY MOUTH TWICE DAILY AS NEEDED FOR 7 DAYS  0  . dorzolamide-timolol (COSOPT) 22.3-6.8 MG/ML ophthalmic solution Place 1 drop into both eyes 2 (two) times daily.  99  . fluticasone (FLONASE) 50 MCG/ACT nasal spray     . furosemide (LASIX) 40 MG tablet TAKE ONE TABLET BY MOUTH IN THE MORNING, TAKE 1/2 TABLET IN THE AFTERNOON AND TAKE 1/2 TABLET AT NIGHT  3  . HYDROcodone-acetaminophen (NORCO) 10-325 MG tablet Take 1 tablet by mouth every 4 (four) hours as needed. for pain 90 tablet 0  . ipratropium-albuterol (DUONEB) 0.5-2.5 (3) MG/3ML SOLN Take 3 mLs by nebulization 2 (two) times daily.      Marland Kitchen lenalidomide (REVLIMID) 10 MG capsule Take 1 capsule (10 mg total) by mouth every other day. 14 capsule 1  . LUMIGAN 0.01 % SOLN INSTILL ONE DROP IN EACH EYE AT BEDTIME  99  . metFORMIN (GLUCOPHAGE) 500 MG tablet Take 500 mg by mouth daily with breakfast.    . Multiple Vitamins-Minerals (MULTIVITAMIN ADULTS 50+ PO) TAKE ONE TABLET BY MOUTH DAILY  4  . nystatin cream (MYCOSTATIN) APPLY TO THE AFFECTED AREA AS NEEDED IRRITATION  1  . PERFOROMIST 20 MCG/2ML nebulizer solution Inhale 20 mcg into the lungs 2 (two) times daily.     . polyethylene glycol powder (GLYCOLAX/MIRALAX) powder     . potassium chloride SA (K-DUR,KLOR-CON) 20 MEQ tablet Take 20 mEq by mouth 2 (two) times daily.  2  . predniSONE (DELTASONE) 10 MG tablet Take 10 mg by mouth daily.      Current Facility-Administered Medications  Medication Dose Route Frequency Provider Last Rate Last Dose  . sodium chloride flush (NS) 0.9 % injection 10 mL  10 mL Intravenous PRN Patrici Ranks, MD   10 mL at 10/17/16 1415    Review of Systems  Constitutional: Negative.        Poor appetite  HENT: Negative.   Eyes: Negative.   Respiratory: Negative.   Cardiovascular: Negative.  Negative for chest pain.  Gastrointestinal: Negative.   Genitourinary: Negative.   Musculoskeletal: Negative.     Skin: Negative.   Neurological: Negative.   Endo/Heme/Allergies: Negative.   Psychiatric/Behavioral: Negative.   All other systems reviewed and are negative. 14 point ROS was done and is otherwise as detailed above or in HPI   PHYSICAL EXAMINATION: ECOG PERFORMANCE STATUS: 2 - Symptomatic, <50% confined to bed  Vitals:   10/17/16 1335  BP: 127/64  Pulse: 76  Resp: 20  Temp: 98.5 F (36.9 C)   Filed Weights   10/17/16 1335  Weight: 121 lb 4.8 oz (55 kg)   Physical Exam  Constitutional: She is oriented to person, place, and time and well-developed, well-nourished, and in no distress. No distress.  Physical exam was done in chair. Patient is on O2 by nasal canula  Kyphosis, petite   HENT:  Head: Normocephalic and atraumatic.  Mouth/Throat: Oropharynx is clear and moist.  Eyes: Conjunctivae and EOM are normal. Pupils are equal, round, and reactive to light. Right eye exhibits no discharge. Left eye exhibits no discharge. No scleral icterus.  Neck: Normal range of motion. Neck supple.  Cardiovascular: Normal rate, regular rhythm and normal heart sounds.   Pulmonary/Chest: Effort normal and breath sounds normal. No respiratory distress. She has no wheezes. She has no rales. She exhibits no tenderness.  Abdominal: Soft. Bowel sounds are normal. She exhibits no distension and no mass. There is no tenderness. There is no rebound and no guarding.  Musculoskeletal: Normal range of motion. She exhibits no edema.  Neurological: She is alert and oriented to person, place, and time. No cranial nerve deficit. Gait normal.  Skin: Skin is warm and dry. No rash noted. No erythema. No pallor.  Psychiatric: Mood, memory, affect and judgment normal.  Nursing note and vitals reviewed.   LABORATORY DATA:  I have reviewed the data as listed Lab Results  Component Value Date   WBC 6.3 10/17/2016   HGB 11.9 (L) 10/17/2016   HCT 35.3 (L) 10/17/2016   MCV 95.9 10/17/2016   PLT 173 10/17/2016    CMP     Component Value Date/Time   NA 135 10/17/2016 1420   K 3.8 10/17/2016 1420   CL 93 (L) 10/17/2016 1420   CO2 32 10/17/2016 1420   GLUCOSE 163 (H) 10/17/2016 1420   BUN 20 10/17/2016 1420   CREATININE 1.37 (H) 10/17/2016 1420   CALCIUM 9.5 10/17/2016 1420   PROT 7.3 10/17/2016 1420   ALBUMIN 4.2 10/17/2016 1420   AST 20 10/17/2016 1420   ALT 20 10/17/2016 1420   ALKPHOS 51 10/17/2016 1420   BILITOT 1.0 10/17/2016 1420   GFRNONAA 36 (L) 10/17/2016 1420   GFRAA 41 (L) 10/17/2016 1420   Results for NEVILLE, PAULS (MRN [id number])   Ref. Range 10/17/2016 14:20 10/17/2016 14:20  Sodium Latest Ref Range: 135 - 145 mmol/L 135   Potassium Latest Ref Range: 3.5 - 5.1 mmol/L 3.8   Chloride Latest Ref Range: 101 - 111 mmol/L 93 (L)   CO2 Latest Ref Range: 22 - 32 mmol/L 32   Glucose Latest Ref Range: 65 - 99 mg/dL 163 (H)   BUN Latest Ref Range: 6 - 20 mg/dL 20   Creatinine Latest Ref Range: 0.44 - 1.00 mg/dL 1.37 (H)   Calcium Latest Ref Range: 8.9 - 10.3 mg/dL 9.5   Anion gap Latest Ref Range: 5 - 15  10   Alkaline Phosphatase Latest Ref Range: 38 - 126 U/L 51   Albumin Latest Ref Range: 3.5 - 5.0 g/dL 4.2   AST Latest Ref Range: 15 - 41 U/L 20   ALT Latest Ref Range: 14 - 54 U/L 20   Total Protein Latest Ref Range: 6.5 - 8.1 g/dL 7.3   Total Bilirubin Latest Ref Range: 0.3 - 1.2 mg/dL 1.0   EGFR (African American) Latest Ref Range: >60 mL/min 41 (L)   EGFR (Non-African Amer.) Latest Ref Range: >60 mL/min 36 (L)   Total Protein ELP Latest Ref Range: 6.0 - 8.5 g/dL 6.7 6.8  Albumin ELP Latest  Ref Range: 2.9 - 4.4 g/dL  4.0  Globulin, Total Latest Ref Range: 2.2 - 3.9 g/dL  2.8  A/G Ratio Latest Ref Range: 0.7 - 1.7   1.4  Alpha-1-Globulin Latest Ref Range: 0.0 - 0.4 g/dL  0.2  Alpha-2-Globulin Latest Ref Range: 0.4 - 1.0 g/dL  1.0  Beta Globulin Latest Ref Range: 0.7 - 1.3 g/dL  0.9  Gamma Globulin Latest Ref Range: 0.4 - 1.8 g/dL  0.7  M-SPIKE, % Latest Ref Range: Not  Observed g/dL  Not Observed  SPE Interp. Unknown  Comment  Comment Unknown  Comment  PDF Unknown  .  IgG (Immunoglobin G), Serum Latest Ref Range: 700 - 1,600 mg/dL 747   IgA Latest Ref Range: 64 - 422 mg/dL 151   IgM, Serum Latest Ref Range: 26 - 217 mg/dL 46   Kappa free light chain Latest Ref Range: 3.3 - 19.4 mg/L 30.3 (H)   Lamda free light chains Latest Ref Range: 5.7 - 26.3 mg/L 28.0 (H)   Kappa, lamda light chain ratio Latest Ref Range: 0.26 - 1.65  1.08   WBC Latest Ref Range: 4.0 - 10.5 K/uL 6.3   RBC Latest Ref Range: 3.87 - 5.11 MIL/uL 3.68 (L)   Hemoglobin Latest Ref Range: 12.0 - 15.0 g/dL 11.9 (L)   HCT Latest Ref Range: 36.0 - 46.0 % 35.3 (L)   MCV Latest Ref Range: 78.0 - 100.0 fL 95.9   MCH Latest Ref Range: 26.0 - 34.0 pg 32.3   MCHC Latest Ref Range: 30.0 - 36.0 g/dL 33.7   RDW Latest Ref Range: 11.5 - 15.5 % 17.0 (H)   Platelets Latest Ref Range: 150 - 400 K/uL 173   Neutrophils Latest Units: % 79   Lymphocytes Latest Units: % 9   Monocytes Relative Latest Units: % 9   Eosinophil Latest Units: % 2   Basophil Latest Units: % 1   NEUT# Latest Ref Range: 1.7 - 7.7 K/uL 5.0   Lymphocyte # Latest Ref Range: 0.7 - 4.0 K/uL 0.6 (L)   Monocyte # Latest Ref Range: 0.1 - 1.0 K/uL 0.6   Eosinophils Absolute Latest Ref Range: 0.0 - 0.7 K/uL 0.1   Basophils Absolute Latest Ref Range: 0.0 - 0.1 K/uL 0.0   Immunofixation Result, Serum Unknown Comment         RADIOGRAPHIC STUDIES: I have personally reviewed the radiological images as listed and agreed with the findings in the report. Study Result   CLINICAL DATA:  Shortness of Breath  EXAM: CHEST  2 VIEW  COMPARISON:  09/01/2016  FINDINGS: Cardiomegaly again noted. Old bilateral rib fractures. Stable right IJ Port-A-Cath position. No infiltrate or pulmonary edema. Thoracic spine osteopenia. Prior vertebroplasty mid thoracic spine. Mild compression deformities lower thoracic spine of indeterminate  age.  IMPRESSION: No active cardiopulmonary disease. Cardiomegaly again noted. Stable right Port-A-Cath position.   Electronically Signed   By: Lahoma Crocker M.D.   On: 09/17/2016 12:27      ASSESSMENT & PLAN:  IgG Multiple myeloma COPD HX fractures Anxiety on xanax Hx renal failure Hx B12 deficiency  Cancer related pain  Labs today are available and were reviewed with the patient. They are included within this note.She had no evidence of an M-spike on SPEP/IEP, normal kappa/lambda ratio is noted. Will continue to follow myeloma labs moving forward.   She takes Revlimid every other day. She takes one potassium supplement daily. She will continue with revlimid as prescribed.   The patient is agreeable  to spacing out her Zometa to every 4 months.. She will be due for her next Zometa in February 2018. She has been on zometa since 2012.   I advised drinking Ensure 1x a day. We will try to coordinate her follow up zometa and myeloma labs at the same visit. I will look into the last time her port was flushed, if needed it will be flushed today. Otherwise it will be flushed at her next visit.  I refilled her Revlimid, Xanax, and hydrocodone today. I refilled her pain medication for 3 months at the patient's request.  She has been on these medications for many years. She notes that she has chronic anxiety and xanax works well for her. These were prescribed prior to here transfer here.  She has been scheduled for a return follow up on 01/01/17.   ORDERS PLACED FOR THIS ENCOUNTER: Orders Placed This Encounter  Procedures  . Protein electrophoresis, serum  . Immunofixation electrophoresis  . CBC with Differential  . Kappa/lambda light chains  . Comprehensive metabolic panel    MEDICATIONS PRESCRIBED THIS ENCOUNTER: Meds ordered this encounter  Medications  . ALPRAZolam (XANAX) 0.25 MG tablet    Sig: Take 1 tablet (0.25 mg total) by mouth 2 (two) times daily.    Dispense:  60  tablet    Refill:  3  . DISCONTD: HYDROcodone-acetaminophen (NORCO) 10-325 MG tablet    Sig: Take 1 tablet by mouth every 4 (four) hours as needed. for pain    Dispense:  90 tablet    Refill:  0    Fill on or around 11/07/16  . DISCONTD: HYDROcodone-acetaminophen (NORCO) 10-325 MG tablet    Sig: Take 1 tablet by mouth every 4 (four) hours as needed. for pain    Dispense:  90 tablet    Refill:  0    Fill on or around 12/08/16  . HYDROcodone-acetaminophen (NORCO) 10-325 MG tablet    Sig: Take 1 tablet by mouth every 4 (four) hours as needed. for pain    Dispense:  90 tablet    Refill:  0    Fill on or around 01/05/17  . ALPRAZolam (XANAX) 1 MG tablet    Sig: Take 1 tablet (1 mg total) by mouth 2 (two) times daily as needed for anxiety.    Dispense:  60 tablet    Refill:  3  . heparin lock flush 100 unit/mL  . sodium chloride flush (NS) 0.9 % injection 10 mL    All questions were answered. The patient knows to call the clinic with any problems, questions or concerns.  This document serves as a record of services personally performed by Ancil Linsey, MD. It was created on her behalf by Shirlean Mylar, a trained medical scribe. The creation of this record is based on the scribe's personal observations and the provider's statements to them. This document has been checked and approved by the attending provider.   I have reviewed the above documentation for accuracy and completeness and I agree with the above.  This note was electronically signed.    Molli Hazard, MD  11/13/2016 7:00 AM

## 2016-10-18 LAB — KAPPA/LAMBDA LIGHT CHAINS
Kappa free light chain: 30.3 mg/L — ABNORMAL HIGH (ref 3.3–19.4)
Kappa, lambda light chain ratio: 1.08 (ref 0.26–1.65)
Lambda free light chains: 28 mg/L — ABNORMAL HIGH (ref 5.7–26.3)

## 2016-10-18 LAB — PROTEIN ELECTROPHORESIS, SERUM
A/G RATIO SPE: 1.4 (ref 0.7–1.7)
ALPHA-1-GLOBULIN: 0.2 g/dL (ref 0.0–0.4)
Albumin ELP: 4 g/dL (ref 2.9–4.4)
Alpha-2-Globulin: 1 g/dL (ref 0.4–1.0)
Beta Globulin: 0.9 g/dL (ref 0.7–1.3)
GLOBULIN, TOTAL: 2.8 g/dL (ref 2.2–3.9)
Gamma Globulin: 0.7 g/dL (ref 0.4–1.8)
PDF SPE: 0
Total Protein ELP: 6.8 g/dL (ref 6.0–8.5)

## 2016-10-18 LAB — IMMUNOFIXATION ELECTROPHORESIS
IGA: 151 mg/dL (ref 64–422)
IGG (IMMUNOGLOBIN G), SERUM: 747 mg/dL (ref 700–1600)
IgM, Serum: 46 mg/dL (ref 26–217)
TOTAL PROTEIN ELP: 6.7 g/dL (ref 6.0–8.5)

## 2016-11-02 DIAGNOSIS — J449 Chronic obstructive pulmonary disease, unspecified: Secondary | ICD-10-CM | POA: Diagnosis not present

## 2016-11-05 DIAGNOSIS — J9610 Chronic respiratory failure, unspecified whether with hypoxia or hypercapnia: Secondary | ICD-10-CM | POA: Diagnosis not present

## 2016-11-05 DIAGNOSIS — J449 Chronic obstructive pulmonary disease, unspecified: Secondary | ICD-10-CM | POA: Diagnosis not present

## 2016-11-13 ENCOUNTER — Encounter (HOSPITAL_COMMUNITY): Payer: Self-pay | Admitting: Hematology & Oncology

## 2016-11-14 ENCOUNTER — Other Ambulatory Visit (HOSPITAL_COMMUNITY): Payer: Self-pay | Admitting: Oncology

## 2016-11-14 DIAGNOSIS — C9000 Multiple myeloma not having achieved remission: Secondary | ICD-10-CM

## 2016-11-14 MED ORDER — LENALIDOMIDE 10 MG PO CAPS: 10.0000 mg | ORAL_CAPSULE | ORAL | 1 refills | Status: DC

## 2016-11-27 ENCOUNTER — Encounter (HOSPITAL_COMMUNITY): Payer: Medicare Other | Attending: Oncology

## 2016-11-27 ENCOUNTER — Encounter (HOSPITAL_COMMUNITY): Payer: Self-pay

## 2016-11-27 DIAGNOSIS — C9000 Multiple myeloma not having achieved remission: Secondary | ICD-10-CM

## 2016-11-27 DIAGNOSIS — Z452 Encounter for adjustment and management of vascular access device: Secondary | ICD-10-CM | POA: Diagnosis not present

## 2016-11-27 MED ORDER — SODIUM CHLORIDE 0.9% FLUSH
10.0000 mL | INTRAVENOUS | Status: DC | PRN
  Administered 2016-11-27: 10 mL via INTRAVENOUS
  Filled 2016-11-27: qty 10

## 2016-11-27 MED ORDER — HEPARIN SOD (PORK) LOCK FLUSH 100 UNIT/ML IV SOLN
INTRAVENOUS | Status: AC
  Filled 2016-11-27: qty 5

## 2016-11-27 MED ORDER — HEPARIN SOD (PORK) LOCK FLUSH 100 UNIT/ML IV SOLN
500.0000 [IU] | Freq: Once | INTRAVENOUS | Status: AC
  Administered 2016-11-27: 500 [IU] via INTRAVENOUS

## 2016-11-27 NOTE — Progress Notes (Signed)
Brooke Keller presented for Portacath access and flush. Portacath located right chest wall accessed with  H 20 needle. Good blood return present. Portacath flushed with 17ml NS and 500U/47ml Heparin and needle removed intact. Procedure without incident. Patient tolerated procedure well. Vitals stable and discharged home from clinic ambulatory. Follow up as scheduled.

## 2016-11-27 NOTE — Patient Instructions (Signed)
[facility] at [facility] Discharge Instructions  RECOMMENDATIONS MADE BY THE CONSULTANT AND ANY TEST RESULTS WILL BE SENT TO YOUR REFERRING PHYSICIAN.  Port flush  Follow up as scheduled  Thank you for choosing [facility] at [facility] to provide your oncology and hematology care.  To afford each patient quality time with our provider, please arrive at least 15 minutes before your scheduled appointment time.    If you have a lab appointment with the Cancer Center please come in thru the  Main Entrance and check in at the main information desk  You need to re-schedule your appointment should you arrive 10 or more minutes late.  We strive to give you quality time with our providers, and arriving late affects you and other patients whose appointments are after yours.  Also, if you no show three or more times for appointments you may be dismissed from the clinic at the providers discretion.     Again, thank you for choosing [facility].  Our hope is that these requests will decrease the amount of time that you wait before being seen by our physicians.       _____________________________________________________________  Should you have questions after your visit to [facility], please contact our office at [phone] between the hours of 8:30 a.m. and 4:30 p.m.  Voicemails left after 4:30 p.m. will not be returned until the following business day.  For prescription refill requests, have your pharmacy contact our office.       Resources For Cancer Patients and their Caregivers ? American Cancer Society: Can assist with transportation, wigs, general needs, runs Look Good Feel Better.        1-888-227-6333 ? Cancer Care: Provides financial assistance, online support groups, medication/co-pay assistance.  1-800-813-HOPE (4673) ? [facility] Assists [place] cancer patients and their  families through emotional , educational and financial support.  336-427-4357 ? [place] DSS Where to apply for food stamps, Medicaid and utility assistance. 336-342-1394 ? [facility]: Transportation to medical appointments. 336-347-2287 ? Social Security Administration: May apply for disability if have a Stage IV cancer. 336-342-7796 1-800-772-1213 ? [place] Aging, Disability and Transit Services: Assists with nutrition, care and transit needs. 336-349-2343  Cancer Center Support Programs: @10RELATIVEDAYS@ > Cancer Support Group  2nd Tuesday of the month 1pm-2pm, Journey Room  > Creative Journey  3rd Tuesday of the month 1130am-1pm, Journey Room  > Look Good Feel Better  1st Wednesday of the month 10am-12 noon, Journey Room (Call American Cancer Society to register 1-800-395-5775)   

## 2016-11-30 DIAGNOSIS — E119 Type 2 diabetes mellitus without complications: Secondary | ICD-10-CM | POA: Diagnosis not present

## 2016-11-30 DIAGNOSIS — G894 Chronic pain syndrome: Secondary | ICD-10-CM | POA: Diagnosis not present

## 2016-11-30 DIAGNOSIS — Z9981 Dependence on supplemental oxygen: Secondary | ICD-10-CM | POA: Diagnosis not present

## 2016-11-30 DIAGNOSIS — J9622 Acute and chronic respiratory failure with hypercapnia: Secondary | ICD-10-CM | POA: Diagnosis not present

## 2016-11-30 DIAGNOSIS — R05 Cough: Secondary | ICD-10-CM | POA: Diagnosis not present

## 2016-11-30 DIAGNOSIS — J441 Chronic obstructive pulmonary disease with (acute) exacerbation: Secondary | ICD-10-CM | POA: Diagnosis not present

## 2016-11-30 DIAGNOSIS — Z79899 Other long term (current) drug therapy: Secondary | ICD-10-CM | POA: Diagnosis not present

## 2016-11-30 DIAGNOSIS — I110 Hypertensive heart disease with heart failure: Secondary | ICD-10-CM | POA: Diagnosis not present

## 2016-11-30 DIAGNOSIS — D469 Myelodysplastic syndrome, unspecified: Secondary | ICD-10-CM | POA: Diagnosis not present

## 2016-11-30 DIAGNOSIS — J9600 Acute respiratory failure, unspecified whether with hypoxia or hypercapnia: Secondary | ICD-10-CM | POA: Diagnosis not present

## 2016-11-30 DIAGNOSIS — Z825 Family history of asthma and other chronic lower respiratory diseases: Secondary | ICD-10-CM | POA: Diagnosis not present

## 2016-11-30 DIAGNOSIS — J9621 Acute and chronic respiratory failure with hypoxia: Secondary | ICD-10-CM | POA: Diagnosis not present

## 2016-11-30 DIAGNOSIS — Z8249 Family history of ischemic heart disease and other diseases of the circulatory system: Secondary | ICD-10-CM | POA: Diagnosis not present

## 2016-11-30 DIAGNOSIS — J449 Chronic obstructive pulmonary disease, unspecified: Secondary | ICD-10-CM | POA: Diagnosis not present

## 2016-11-30 DIAGNOSIS — Z87891 Personal history of nicotine dependence: Secondary | ICD-10-CM | POA: Diagnosis not present

## 2016-11-30 DIAGNOSIS — R062 Wheezing: Secondary | ICD-10-CM | POA: Diagnosis not present

## 2016-11-30 DIAGNOSIS — Z7984 Long term (current) use of oral hypoglycemic drugs: Secondary | ICD-10-CM | POA: Diagnosis not present

## 2016-11-30 DIAGNOSIS — R0602 Shortness of breath: Secondary | ICD-10-CM | POA: Diagnosis not present

## 2016-11-30 DIAGNOSIS — I5032 Chronic diastolic (congestive) heart failure: Secondary | ICD-10-CM | POA: Diagnosis not present

## 2016-12-10 DIAGNOSIS — E119 Type 2 diabetes mellitus without complications: Secondary | ICD-10-CM | POA: Diagnosis not present

## 2016-12-10 DIAGNOSIS — Z7984 Long term (current) use of oral hypoglycemic drugs: Secondary | ICD-10-CM | POA: Diagnosis not present

## 2016-12-10 DIAGNOSIS — I509 Heart failure, unspecified: Secondary | ICD-10-CM | POA: Diagnosis not present

## 2016-12-10 DIAGNOSIS — J9621 Acute and chronic respiratory failure with hypoxia: Secondary | ICD-10-CM | POA: Diagnosis not present

## 2016-12-10 DIAGNOSIS — Z7952 Long term (current) use of systemic steroids: Secondary | ICD-10-CM | POA: Diagnosis not present

## 2016-12-10 DIAGNOSIS — Z9981 Dependence on supplemental oxygen: Secondary | ICD-10-CM | POA: Diagnosis not present

## 2016-12-10 DIAGNOSIS — J9612 Chronic respiratory failure with hypercapnia: Secondary | ICD-10-CM | POA: Diagnosis not present

## 2016-12-10 DIAGNOSIS — Z7982 Long term (current) use of aspirin: Secondary | ICD-10-CM | POA: Diagnosis not present

## 2016-12-10 DIAGNOSIS — J449 Chronic obstructive pulmonary disease, unspecified: Secondary | ICD-10-CM | POA: Diagnosis not present

## 2016-12-10 DIAGNOSIS — Z8579 Personal history of other malignant neoplasms of lymphoid, hematopoietic and related tissues: Secondary | ICD-10-CM | POA: Diagnosis not present

## 2016-12-10 DIAGNOSIS — I110 Hypertensive heart disease with heart failure: Secondary | ICD-10-CM | POA: Diagnosis not present

## 2016-12-11 DIAGNOSIS — J449 Chronic obstructive pulmonary disease, unspecified: Secondary | ICD-10-CM | POA: Diagnosis not present

## 2016-12-11 DIAGNOSIS — J441 Chronic obstructive pulmonary disease with (acute) exacerbation: Secondary | ICD-10-CM | POA: Diagnosis not present

## 2016-12-11 DIAGNOSIS — J9610 Chronic respiratory failure, unspecified whether with hypoxia or hypercapnia: Secondary | ICD-10-CM | POA: Diagnosis not present

## 2016-12-11 DIAGNOSIS — J9600 Acute respiratory failure, unspecified whether with hypoxia or hypercapnia: Secondary | ICD-10-CM | POA: Diagnosis not present

## 2016-12-12 DIAGNOSIS — E119 Type 2 diabetes mellitus without complications: Secondary | ICD-10-CM | POA: Diagnosis not present

## 2016-12-12 DIAGNOSIS — Z9981 Dependence on supplemental oxygen: Secondary | ICD-10-CM | POA: Diagnosis not present

## 2016-12-12 DIAGNOSIS — J449 Chronic obstructive pulmonary disease, unspecified: Secondary | ICD-10-CM | POA: Diagnosis not present

## 2016-12-12 DIAGNOSIS — Z7952 Long term (current) use of systemic steroids: Secondary | ICD-10-CM | POA: Diagnosis not present

## 2016-12-12 DIAGNOSIS — Z8579 Personal history of other malignant neoplasms of lymphoid, hematopoietic and related tissues: Secondary | ICD-10-CM | POA: Diagnosis not present

## 2016-12-12 DIAGNOSIS — Z7982 Long term (current) use of aspirin: Secondary | ICD-10-CM | POA: Diagnosis not present

## 2016-12-12 DIAGNOSIS — I509 Heart failure, unspecified: Secondary | ICD-10-CM | POA: Diagnosis not present

## 2016-12-12 DIAGNOSIS — Z7984 Long term (current) use of oral hypoglycemic drugs: Secondary | ICD-10-CM | POA: Diagnosis not present

## 2016-12-12 DIAGNOSIS — J9621 Acute and chronic respiratory failure with hypoxia: Secondary | ICD-10-CM | POA: Diagnosis not present

## 2016-12-12 DIAGNOSIS — I110 Hypertensive heart disease with heart failure: Secondary | ICD-10-CM | POA: Diagnosis not present

## 2016-12-12 DIAGNOSIS — J9612 Chronic respiratory failure with hypercapnia: Secondary | ICD-10-CM | POA: Diagnosis not present

## 2016-12-13 ENCOUNTER — Other Ambulatory Visit (HOSPITAL_COMMUNITY): Payer: Self-pay | Admitting: Oncology

## 2016-12-13 DIAGNOSIS — J9612 Chronic respiratory failure with hypercapnia: Secondary | ICD-10-CM | POA: Diagnosis not present

## 2016-12-13 DIAGNOSIS — Z7952 Long term (current) use of systemic steroids: Secondary | ICD-10-CM | POA: Diagnosis not present

## 2016-12-13 DIAGNOSIS — Z7982 Long term (current) use of aspirin: Secondary | ICD-10-CM | POA: Diagnosis not present

## 2016-12-13 DIAGNOSIS — J449 Chronic obstructive pulmonary disease, unspecified: Secondary | ICD-10-CM | POA: Diagnosis not present

## 2016-12-13 DIAGNOSIS — Z8579 Personal history of other malignant neoplasms of lymphoid, hematopoietic and related tissues: Secondary | ICD-10-CM | POA: Diagnosis not present

## 2016-12-13 DIAGNOSIS — C9000 Multiple myeloma not having achieved remission: Secondary | ICD-10-CM

## 2016-12-13 DIAGNOSIS — I509 Heart failure, unspecified: Secondary | ICD-10-CM | POA: Diagnosis not present

## 2016-12-13 DIAGNOSIS — I110 Hypertensive heart disease with heart failure: Secondary | ICD-10-CM | POA: Diagnosis not present

## 2016-12-13 DIAGNOSIS — J9621 Acute and chronic respiratory failure with hypoxia: Secondary | ICD-10-CM | POA: Diagnosis not present

## 2016-12-13 DIAGNOSIS — Z7984 Long term (current) use of oral hypoglycemic drugs: Secondary | ICD-10-CM | POA: Diagnosis not present

## 2016-12-13 DIAGNOSIS — E119 Type 2 diabetes mellitus without complications: Secondary | ICD-10-CM | POA: Diagnosis not present

## 2016-12-13 DIAGNOSIS — Z9981 Dependence on supplemental oxygen: Secondary | ICD-10-CM | POA: Diagnosis not present

## 2016-12-13 MED ORDER — LENALIDOMIDE 10 MG PO CAPS: 10.0000 mg | ORAL_CAPSULE | ORAL | 0 refills | Status: DC

## 2016-12-16 DIAGNOSIS — J9612 Chronic respiratory failure with hypercapnia: Secondary | ICD-10-CM | POA: Diagnosis not present

## 2016-12-16 DIAGNOSIS — Z7982 Long term (current) use of aspirin: Secondary | ICD-10-CM | POA: Diagnosis not present

## 2016-12-16 DIAGNOSIS — Z9981 Dependence on supplemental oxygen: Secondary | ICD-10-CM | POA: Diagnosis not present

## 2016-12-16 DIAGNOSIS — I110 Hypertensive heart disease with heart failure: Secondary | ICD-10-CM | POA: Diagnosis not present

## 2016-12-16 DIAGNOSIS — Z7952 Long term (current) use of systemic steroids: Secondary | ICD-10-CM | POA: Diagnosis not present

## 2016-12-16 DIAGNOSIS — Z8579 Personal history of other malignant neoplasms of lymphoid, hematopoietic and related tissues: Secondary | ICD-10-CM | POA: Diagnosis not present

## 2016-12-16 DIAGNOSIS — E119 Type 2 diabetes mellitus without complications: Secondary | ICD-10-CM | POA: Diagnosis not present

## 2016-12-16 DIAGNOSIS — I509 Heart failure, unspecified: Secondary | ICD-10-CM | POA: Diagnosis not present

## 2016-12-16 DIAGNOSIS — J9621 Acute and chronic respiratory failure with hypoxia: Secondary | ICD-10-CM | POA: Diagnosis not present

## 2016-12-16 DIAGNOSIS — J449 Chronic obstructive pulmonary disease, unspecified: Secondary | ICD-10-CM | POA: Diagnosis not present

## 2016-12-16 DIAGNOSIS — Z7984 Long term (current) use of oral hypoglycemic drugs: Secondary | ICD-10-CM | POA: Diagnosis not present

## 2016-12-18 DIAGNOSIS — Z8579 Personal history of other malignant neoplasms of lymphoid, hematopoietic and related tissues: Secondary | ICD-10-CM | POA: Diagnosis not present

## 2016-12-18 DIAGNOSIS — Z9981 Dependence on supplemental oxygen: Secondary | ICD-10-CM | POA: Diagnosis not present

## 2016-12-18 DIAGNOSIS — E119 Type 2 diabetes mellitus without complications: Secondary | ICD-10-CM | POA: Diagnosis not present

## 2016-12-18 DIAGNOSIS — J9621 Acute and chronic respiratory failure with hypoxia: Secondary | ICD-10-CM | POA: Diagnosis not present

## 2016-12-18 DIAGNOSIS — Z7952 Long term (current) use of systemic steroids: Secondary | ICD-10-CM | POA: Diagnosis not present

## 2016-12-18 DIAGNOSIS — I509 Heart failure, unspecified: Secondary | ICD-10-CM | POA: Diagnosis not present

## 2016-12-18 DIAGNOSIS — I110 Hypertensive heart disease with heart failure: Secondary | ICD-10-CM | POA: Diagnosis not present

## 2016-12-18 DIAGNOSIS — Z7984 Long term (current) use of oral hypoglycemic drugs: Secondary | ICD-10-CM | POA: Diagnosis not present

## 2016-12-18 DIAGNOSIS — J449 Chronic obstructive pulmonary disease, unspecified: Secondary | ICD-10-CM | POA: Diagnosis not present

## 2016-12-18 DIAGNOSIS — J9612 Chronic respiratory failure with hypercapnia: Secondary | ICD-10-CM | POA: Diagnosis not present

## 2016-12-18 DIAGNOSIS — Z7982 Long term (current) use of aspirin: Secondary | ICD-10-CM | POA: Diagnosis not present

## 2016-12-25 DIAGNOSIS — Z7952 Long term (current) use of systemic steroids: Secondary | ICD-10-CM | POA: Diagnosis not present

## 2016-12-25 DIAGNOSIS — Z9981 Dependence on supplemental oxygen: Secondary | ICD-10-CM | POA: Diagnosis not present

## 2016-12-25 DIAGNOSIS — Z8579 Personal history of other malignant neoplasms of lymphoid, hematopoietic and related tissues: Secondary | ICD-10-CM | POA: Diagnosis not present

## 2016-12-25 DIAGNOSIS — I509 Heart failure, unspecified: Secondary | ICD-10-CM | POA: Diagnosis not present

## 2016-12-25 DIAGNOSIS — J449 Chronic obstructive pulmonary disease, unspecified: Secondary | ICD-10-CM | POA: Diagnosis not present

## 2016-12-25 DIAGNOSIS — J9621 Acute and chronic respiratory failure with hypoxia: Secondary | ICD-10-CM | POA: Diagnosis not present

## 2016-12-25 DIAGNOSIS — Z7982 Long term (current) use of aspirin: Secondary | ICD-10-CM | POA: Diagnosis not present

## 2016-12-25 DIAGNOSIS — E119 Type 2 diabetes mellitus without complications: Secondary | ICD-10-CM | POA: Diagnosis not present

## 2016-12-25 DIAGNOSIS — J9612 Chronic respiratory failure with hypercapnia: Secondary | ICD-10-CM | POA: Diagnosis not present

## 2016-12-25 DIAGNOSIS — I110 Hypertensive heart disease with heart failure: Secondary | ICD-10-CM | POA: Diagnosis not present

## 2016-12-25 DIAGNOSIS — Z7984 Long term (current) use of oral hypoglycemic drugs: Secondary | ICD-10-CM | POA: Diagnosis not present

## 2016-12-26 DIAGNOSIS — I110 Hypertensive heart disease with heart failure: Secondary | ICD-10-CM | POA: Diagnosis not present

## 2016-12-26 DIAGNOSIS — J9612 Chronic respiratory failure with hypercapnia: Secondary | ICD-10-CM | POA: Diagnosis not present

## 2016-12-26 DIAGNOSIS — Z7952 Long term (current) use of systemic steroids: Secondary | ICD-10-CM | POA: Diagnosis not present

## 2016-12-26 DIAGNOSIS — Z7984 Long term (current) use of oral hypoglycemic drugs: Secondary | ICD-10-CM | POA: Diagnosis not present

## 2016-12-26 DIAGNOSIS — Z7982 Long term (current) use of aspirin: Secondary | ICD-10-CM | POA: Diagnosis not present

## 2016-12-26 DIAGNOSIS — Z9981 Dependence on supplemental oxygen: Secondary | ICD-10-CM | POA: Diagnosis not present

## 2016-12-26 DIAGNOSIS — Z8579 Personal history of other malignant neoplasms of lymphoid, hematopoietic and related tissues: Secondary | ICD-10-CM | POA: Diagnosis not present

## 2016-12-26 DIAGNOSIS — J9621 Acute and chronic respiratory failure with hypoxia: Secondary | ICD-10-CM | POA: Diagnosis not present

## 2016-12-26 DIAGNOSIS — I509 Heart failure, unspecified: Secondary | ICD-10-CM | POA: Diagnosis not present

## 2016-12-26 DIAGNOSIS — J449 Chronic obstructive pulmonary disease, unspecified: Secondary | ICD-10-CM | POA: Diagnosis not present

## 2016-12-26 DIAGNOSIS — E119 Type 2 diabetes mellitus without complications: Secondary | ICD-10-CM | POA: Diagnosis not present

## 2016-12-30 DIAGNOSIS — R0602 Shortness of breath: Secondary | ICD-10-CM | POA: Diagnosis not present

## 2016-12-30 DIAGNOSIS — G4733 Obstructive sleep apnea (adult) (pediatric): Secondary | ICD-10-CM | POA: Diagnosis not present

## 2016-12-30 DIAGNOSIS — J9620 Acute and chronic respiratory failure, unspecified whether with hypoxia or hypercapnia: Secondary | ICD-10-CM | POA: Diagnosis not present

## 2016-12-30 DIAGNOSIS — R0689 Other abnormalities of breathing: Secondary | ICD-10-CM | POA: Diagnosis not present

## 2016-12-30 DIAGNOSIS — Z87891 Personal history of nicotine dependence: Secondary | ICD-10-CM | POA: Diagnosis not present

## 2016-12-31 DIAGNOSIS — J449 Chronic obstructive pulmonary disease, unspecified: Secondary | ICD-10-CM | POA: Diagnosis not present

## 2017-01-01 ENCOUNTER — Other Ambulatory Visit (HOSPITAL_COMMUNITY): Payer: Self-pay | Admitting: Oncology

## 2017-01-01 ENCOUNTER — Other Ambulatory Visit (HOSPITAL_COMMUNITY): Payer: Self-pay

## 2017-01-01 ENCOUNTER — Ambulatory Visit (HOSPITAL_COMMUNITY): Payer: Self-pay

## 2017-01-01 DIAGNOSIS — C9000 Multiple myeloma not having achieved remission: Secondary | ICD-10-CM

## 2017-01-01 MED ORDER — LENALIDOMIDE 10 MG PO CAPS: 10.0000 mg | ORAL_CAPSULE | ORAL | 0 refills | Status: DC

## 2017-01-08 DIAGNOSIS — J449 Chronic obstructive pulmonary disease, unspecified: Secondary | ICD-10-CM | POA: Diagnosis not present

## 2017-01-15 ENCOUNTER — Encounter (HOSPITAL_COMMUNITY): Payer: Medicare Other | Attending: Oncology | Admitting: Oncology

## 2017-01-15 ENCOUNTER — Encounter (HOSPITAL_BASED_OUTPATIENT_CLINIC_OR_DEPARTMENT_OTHER): Payer: Medicare Other

## 2017-01-15 ENCOUNTER — Encounter (HOSPITAL_COMMUNITY): Payer: Self-pay

## 2017-01-15 VITALS — BP 116/49 | HR 73 | Temp 98.7°F | Resp 16 | Wt 114.0 lb

## 2017-01-15 DIAGNOSIS — C9000 Multiple myeloma not having achieved remission: Secondary | ICD-10-CM | POA: Diagnosis not present

## 2017-01-15 DIAGNOSIS — Z95828 Presence of other vascular implants and grafts: Secondary | ICD-10-CM

## 2017-01-15 DIAGNOSIS — F419 Anxiety disorder, unspecified: Secondary | ICD-10-CM | POA: Insufficient documentation

## 2017-01-15 DIAGNOSIS — E538 Deficiency of other specified B group vitamins: Secondary | ICD-10-CM | POA: Diagnosis not present

## 2017-01-15 DIAGNOSIS — G893 Neoplasm related pain (acute) (chronic): Secondary | ICD-10-CM | POA: Insufficient documentation

## 2017-01-15 DIAGNOSIS — J449 Chronic obstructive pulmonary disease, unspecified: Secondary | ICD-10-CM | POA: Insufficient documentation

## 2017-01-15 DIAGNOSIS — R52 Pain, unspecified: Secondary | ICD-10-CM

## 2017-01-15 DIAGNOSIS — Z87891 Personal history of nicotine dependence: Secondary | ICD-10-CM | POA: Diagnosis not present

## 2017-01-15 LAB — COMPREHENSIVE METABOLIC PANEL
ALT: 18 U/L (ref 14–54)
ANION GAP: 10 (ref 5–15)
AST: 24 U/L (ref 15–41)
Albumin: 3.3 g/dL — ABNORMAL LOW (ref 3.5–5.0)
Alkaline Phosphatase: 50 U/L (ref 38–126)
BUN: 16 mg/dL (ref 6–20)
CO2: 25 mmol/L (ref 22–32)
Calcium: 8.5 mg/dL — ABNORMAL LOW (ref 8.9–10.3)
Chloride: 100 mmol/L — ABNORMAL LOW (ref 101–111)
Creatinine, Ser: 1.04 mg/dL — ABNORMAL HIGH (ref 0.44–1.00)
GFR calc Af Amer: 57 mL/min — ABNORMAL LOW (ref >60)
GFR calc non Af Amer: 49 mL/min — ABNORMAL LOW (ref >60)
GLUCOSE: 215 mg/dL — AB (ref 65–99)
POTASSIUM: 3.7 mmol/L (ref 3.5–5.1)
Sodium: 135 mmol/L (ref 135–145)
TOTAL PROTEIN: 6.8 g/dL (ref 6.5–8.1)
Total Bilirubin: 0.4 mg/dL (ref 0.3–1.2)

## 2017-01-15 LAB — CBC WITH DIFFERENTIAL/PLATELET
Basophils Absolute: 0 10*3/uL (ref 0.0–0.1)
Basophils Relative: 1 %
Eosinophils Absolute: 0.1 10*3/uL (ref 0.0–0.7)
Eosinophils Relative: 1 %
HCT: 29.4 % — ABNORMAL LOW (ref 36.0–46.0)
HEMOGLOBIN: 9.6 g/dL — AB (ref 12.0–15.0)
LYMPHS ABS: 0.5 10*3/uL — AB (ref 0.7–4.0)
LYMPHS PCT: 11 %
MCH: 30.2 pg (ref 26.0–34.0)
MCHC: 32.7 g/dL (ref 30.0–36.0)
MCV: 92.5 fL (ref 78.0–100.0)
Monocytes Absolute: 0.3 10*3/uL (ref 0.1–1.0)
Monocytes Relative: 6 %
Neutro Abs: 4 10*3/uL (ref 1.7–7.7)
Neutrophils Relative %: 81 %
PLATELETS: 153 10*3/uL (ref 150–400)
RBC: 3.18 MIL/uL — AB (ref 3.87–5.11)
RDW: 17.1 % — ABNORMAL HIGH (ref 11.5–15.5)
WBC: 5 10*3/uL (ref 4.0–10.5)

## 2017-01-15 MED ORDER — SODIUM CHLORIDE 0.9 % IV SOLN
Freq: Once | INTRAVENOUS | Status: AC
  Administered 2017-01-15: 15:00:00 via INTRAVENOUS

## 2017-01-15 MED ORDER — SODIUM CHLORIDE 0.9 % IV SOLN
3.3000 mg | Freq: Once | INTRAVENOUS | Status: AC
  Administered 2017-01-15: 3.3 mg via INTRAVENOUS
  Filled 2017-01-15: qty 4.13

## 2017-01-15 MED ORDER — HYDROCODONE-ACETAMINOPHEN 10-325 MG PO TABS: 1.0000 | ORAL_TABLET | ORAL | 0 refills | Status: DC | PRN

## 2017-01-15 MED ORDER — HEPARIN SOD (PORK) LOCK FLUSH 100 UNIT/ML IV SOLN
INTRAVENOUS | Status: AC
  Filled 2017-01-15: qty 5

## 2017-01-15 MED ORDER — ALPRAZOLAM 1 MG PO TABS: 1.0000 mg | ORAL_TABLET | Freq: Two times a day (BID) | ORAL | 3 refills | Status: DC | PRN

## 2017-01-15 MED ORDER — HEPARIN SOD (PORK) LOCK FLUSH 100 UNIT/ML IV SOLN
500.0000 [IU] | Freq: Once | INTRAVENOUS | Status: AC
  Administered 2017-01-15: 500 [IU] via INTRAVENOUS

## 2017-01-15 NOTE — Patient Instructions (Signed)
[facility] at [facility] Discharge Instructions  RECOMMENDATIONS MADE BY THE CONSULTANT AND ANY TEST RESULTS WILL BE SENT TO YOUR REFERRING PHYSICIAN.  You were seen today by Dr. Louise Zhou Follow up in 3 months with lab work See Amy up front for appointments   Thank you for choosing [facility] at [facility] to provide your oncology and hematology care.  To afford each patient quality time with our provider, please arrive at least 15 minutes before your scheduled appointment time.    If you have a lab appointment with the Cancer Center please come in thru the  Main Entrance and check in at the main information desk  You need to re-schedule your appointment should you arrive 10 or more minutes late.  We strive to give you quality time with our providers, and arriving late affects you and other patients whose appointments are after yours.  Also, if you no show three or more times for appointments you may be dismissed from the clinic at the providers discretion.     Again, thank you for choosing [facility].  Our hope is that these requests will decrease the amount of time that you wait before being seen by our physicians.       _____________________________________________________________  Should you have questions after your visit to [facility], please contact our office at [phone] between the hours of 8:30 a.m. and 4:30 p.m.  Voicemails left after 4:30 p.m. will not be returned until the following business day.  For prescription refill requests, have your pharmacy contact our office.       Resources For Cancer Patients and their Caregivers ? American Cancer Society: Can assist with transportation, wigs, general needs, runs Look Good Feel Better.        1-888-227-6333 ? Cancer Care: Provides financial assistance, online support groups, medication/co-pay assistance.  1-800-813-HOPE (4673) ? [facility] Assists [place] cancer patients and their families through emotional , educational and financial support.  336-427-4357 ? [place] DSS Where to apply for food stamps, Medicaid and utility assistance. 336-342-1394 ? [facility]: Transportation to medical appointments. 336-347-2287 ? Social Security Administration: May apply for disability if have a Stage IV cancer. 336-342-7796 1-800-772-1213 ? [place] Aging, Disability and Transit Services: Assists with nutrition, care and transit needs. 336-349-2343  Cancer Center Support Programs: @10RELATIVEDAYS@ > Cancer Support Group  2nd Tuesday of the month 1pm-2pm, Journey Room  > Creative Journey  3rd Tuesday of the month 1130am-1pm, Journey Room  > Look Good Feel Better  1st Wednesday of the month 10am-12 noon, Journey Room (Call American Cancer Society to register 1-800-395-5775)    

## 2017-01-15 NOTE — Progress Notes (Signed)
[place]  Progress Note  Patient Care Team: Curlene Labrum, MD as PCP - General (Family Medicine)  CHIEF COMPLAINTS/PURPOSE OF CONSULTATION:  IgG Multiple myeloma Revlimid 10 mg po qod Zometa Q 3 months Transfer from [facility]  HISTORY OF PRESENTING ILLNESS:  Brooke Keller 82 y.o. female returns for follow up of multiple myeloma.  She is doing well today. She has a lot of back pain that is relieved with pain medication and muscle relaxers. She hasn't had the best appetite recently. Denies any other concerns.   MEDICAL HISTORY:  Past Medical History:  Diagnosis Date  . COPD (chronic obstructive pulmonary disease) ([place])   . Diabetes mellitus   . GERD (gastroesophageal reflux disease)   . Hypertension   . IgG multiple myeloma ([place]) 04/14/2016  . Multiple myeloma     SURGICAL HISTORY: Past Surgical History:  Procedure Laterality Date  . carpal tunne    . KYPHOPLASTY    . right fifth toe      SOCIAL HISTORY: Social History   Social History  . Marital status: Married    Spouse name: Arnell Sieving  . Number of children: N/A  . Years of education: N/A   Occupational History  . Not on file.   Social History Main Topics  . Smoking status: Former Smoker    Quit date: 09/30/2007  . Smokeless tobacco: Former Systems developer    Quit date: 09/30/2006  . Alcohol use No  . Drug use: No  . Sexual activity: Yes    Partners: Female   Other Topics Concern  . Not on file   Social History Narrative  . 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 No pertinent family history.  Mother deceased 32 yo. Believes she had Alzheimer's Father deceased 27 yo had congestive heart failure. 11  siblings. 6 have passed. She doesn't know of any with cancer. She is not aware of any family history of myeloma.  ALLERGIES:  is allergic to ativan [lorazepam].  MEDICATIONS:  Current Outpatient Prescriptions  Medication Sig Dispense Refill  . acyclovir (ZOVIRAX) 800 MG tablet Take 800 mg by mouth 2 (two) times daily.    Marland Kitchen ALPRAZolam (XANAX) 1 MG tablet Take 1 tablet (1 mg total) by mouth 2 (two) times daily as needed for anxiety. 60 tablet 3  . aspirin EC 81 MG tablet Take 81 mg by mouth every morning.     Marland Kitchen atorvastatin (LIPITOR) 10 MG tablet Take 10 mg by mouth every morning.     Marland Kitchen azithromycin (ZITHROMAX) 250 MG tablet Take 1 tab by mouth daily 2 each 0  . benzonatate (TESSALON) 100 MG capsule TAKE ONE CAPSULE BY MOUTH UP TO THREE TIMES DAILY AS NEEDED FOR COUGH  0  . brimonidine (ALPHAGAN) 0.2 % ophthalmic solution INSTILL ONE DROP IN EACH EYE THREE TIMES DAILY  99  . Calcium Carbonate-Vitamin D 600-400 MG-UNIT tablet Take 1 tablet by mouth 2 (two) times daily.  4  . calcium-vitamin D (OSCAL WITH D) 500-200 MG-UNIT per tablet Take 1 tablet by mouth 2 (two) times daily.    Marland Kitchen CHERATUSSIN AC 100-10 MG/5ML syrup TAKE ONE TEASPOONFUL (5ML) BY MOUTH TWICE DAILY AS  NEEDED FOR 7 DAYS  0  . dorzolamide-timolol (COSOPT) 22.3-6.8 MG/ML ophthalmic solution Place 1 drop into both eyes 2 (two) times daily.  99  . fluticasone (FLONASE) 50 MCG/ACT nasal spray     . furosemide (LASIX) 40 MG tablet TAKE ONE TABLET BY MOUTH IN THE MORNING, TAKE 1/2 TABLET IN THE AFTERNOON AND TAKE 1/2 TABLET AT NIGHT  3  . HYDROcodone-acetaminophen (NORCO) 10-325 MG tablet Take 1 tablet by mouth every 4 (four) hours as needed. for pain 90 tablet 0  . ipratropium-albuterol (DUONEB) 0.5-2.5 (3) MG/3ML SOLN Take 3 mLs by nebulization 2 (two) times daily.      Marland Kitchen lenalidomide (REVLIMID) 10 MG capsule Take 1 capsule (10 mg total) by mouth every other day. 14 capsule 0  . LUMIGAN 0.01 % SOLN INSTILL ONE DROP IN EACH EYE AT  BEDTIME  99  . metFORMIN (GLUCOPHAGE) 500 MG tablet Take 500 mg by mouth daily with breakfast.    . Multiple Vitamins-Minerals (MULTIVITAMIN ADULTS 50+ PO) TAKE ONE TABLET BY MOUTH DAILY  4  . nystatin cream (MYCOSTATIN) APPLY TO THE AFFECTED AREA AS NEEDED IRRITATION  1  . PERFOROMIST 20 MCG/2ML nebulizer solution Inhale 20 mcg into the lungs 2 (two) times daily.     . polyethylene glycol powder (GLYCOLAX/MIRALAX) powder     . potassium chloride SA (K-DUR,KLOR-CON) 20 MEQ tablet Take 20 mEq by mouth 2 (two) times daily.  2  . predniSONE (DELTASONE) 10 MG tablet Take 10 mg by mouth daily.      No current facility-administered medications for this visit.    Review of Systems  Constitutional: Negative.        Loss of appetite   HENT: Negative.   Eyes: Negative.   Respiratory: Negative.   Cardiovascular: Negative.   Gastrointestinal: Negative.   Genitourinary: Negative.   Musculoskeletal: Positive for back pain.  Skin: Negative.   Neurological: Negative.   Endo/Heme/Allergies: Negative.   Psychiatric/Behavioral: Negative.   All other systems reviewed and are negative. 14 point ROS was done and is otherwise as detailed above or in HPI  PHYSICAL EXAMINATION: ECOG PERFORMANCE STATUS: 2 - Symptomatic, <50% confined to bed  There were no vitals filed for this visit. There were no vitals filed for this visit. Physical Exam  Constitutional: She is oriented to person, place, and time and well-developed, well-nourished, and in no distress. No distress.  HENT:  Head: Normocephalic and atraumatic.  Mouth/Throat: Oropharynx is clear and moist.  Eyes: Conjunctivae and EOM are normal. Pupils are equal, round, and reactive to light. Right eye exhibits no discharge. Left eye exhibits no discharge. No scleral icterus.  Neck: Normal range of motion. Neck supple.  Cardiovascular: Normal rate, regular rhythm and normal heart sounds.   Pulmonary/Chest: Effort normal and breath sounds normal. No  respiratory distress. She has no wheezes. She has no rales. She exhibits no tenderness.  Abdominal: Soft. Bowel sounds are normal. She exhibits no distension and no mass. There is no tenderness. There is no rebound and no guarding.  Musculoskeletal: Normal range of motion. She exhibits no edema.  Neurological: She is alert and oriented to person, place, and time. No cranial nerve deficit. Gait normal.  Skin: Skin is warm and dry. No rash noted. No erythema. No pallor.  Psychiatric: Mood, memory, affect and judgment normal.  Nursing note and vitals reviewed.  LABORATORY DATA:  I have reviewed the data as listed Lab Results  Component Value Date   WBC 5.0 01/15/2017  HGB 9.6 (L) 01/15/2017   HCT 29.4 (L) 01/15/2017   MCV 92.5 01/15/2017   PLT 153 01/15/2017   CMP     Component Value Date/Time   NA 135 01/15/2017 1359   K 3.7 01/15/2017 1359   CL 100 (L) 01/15/2017 1359   CO2 25 01/15/2017 1359   GLUCOSE 215 (H) 01/15/2017 1359   BUN 16 01/15/2017 1359   CREATININE 1.04 (H) 01/15/2017 1359   CALCIUM 8.5 (L) 01/15/2017 1359   PROT 6.8 01/15/2017 1359   ALBUMIN 3.3 (L) 01/15/2017 1359   AST 24 01/15/2017 1359   ALT 18 01/15/2017 1359   ALKPHOS 50 01/15/2017 1359   BILITOT 0.4 01/15/2017 1359   GFRNONAA 49 (L) 01/15/2017 1359   GFRAA 57 (L) 01/15/2017 1359   RADIOGRAPHIC STUDIES: I have personally reviewed the radiological images as listed and agreed with the findings in the report.  DG Chest 2 View 09/17/2016 IMPRESSION: No active cardiopulmonary disease. Cardiomegaly again noted. Stable right Port-A-Cath position.  ASSESSMENT & PLAN:  IgG Multiple myeloma COPD HX fractures Anxiety on xanax Hx renal failure Hx B12 deficiency  Cancer related pain  Labs today are not available yet.She had no evidence of an M-spike on SPEP/IFE, normal kappa/lambda ratio on her labs from 10/2016. Will continue to follow myeloma labs, including those drawn today.  Continue Revlimid every  other day. She takes one potassium supplement daily.   Continue zometa q4 months. She is getting her zometa today.  Refilled her xanax and hydrocodone today. I refilled her pain medication for 3 months at the patient's request.  She has been on these medications for many years. She notes that she has chronic anxiety and xanax works well for her. These were prescribed prior to here transfer here.  She will return for follow up in 3 months.   ORDERS PLACED FOR THIS ENCOUNTER: Orders Placed This Encounter  Procedures  . CBC with Differential    Standing Status:   Future    Standing Expiration Date:   05/17/2017  . Comprehensive metabolic panel    Standing Status:   Future    Standing Expiration Date:   05/17/2017  . Kappa/lambda light chains    Standing Status:   Future    Standing Expiration Date:   05/17/2017  . Beta 2 microglobuline, serum    Standing Status:   Future    Standing Expiration Date:   05/17/2017  . Protein electrophoresis, serum    Standing Status:   Future    Standing Expiration Date:   05/17/2017      All questions were answered. The patient knows to call the clinic with any problems, questions or concerns.  This document serves as a record of services personally performed by Twana First, MD. It was created on her behalf by Martinique Casey, a trained medical scribe. The creation of this record is based on the scribe's personal observations and the provider's statements to them. This document has been checked and approved by the attending provider.  I have reviewed the above documentation for accuracy and completeness and I agree with the above.  This note was electronically signed.    Martinique M Casey  01/15/2017 3:01 PM

## 2017-01-15 NOTE — Progress Notes (Signed)
Corrected calcium level 9.0 per B[facility].  Tolerated zometa infusion well. Stable on discharge home with family via wheelchair.

## 2017-01-16 LAB — KAPPA/LAMBDA LIGHT CHAINS
Kappa free light chain: 48.1 mg/L — ABNORMAL HIGH (ref 3.3–19.4)
Kappa, lambda light chain ratio: 1.03 (ref 0.26–1.65)
Lambda free light chains: 46.9 mg/L — ABNORMAL HIGH (ref 5.7–26.3)

## 2017-01-16 MED ORDER — LIDOCAINE HCL (PF) 1 % IJ SOLN
INTRAMUSCULAR | Status: AC
  Filled 2017-01-16: qty 5

## 2017-01-16 MED ORDER — FENTANYL CITRATE (PF) 100 MCG/2ML IJ SOLN
INTRAMUSCULAR | Status: AC
  Filled 2017-01-16: qty 4

## 2017-01-16 MED ORDER — SUCCINYLCHOLINE CHLORIDE 20 MG/ML IJ SOLN
INTRAMUSCULAR | Status: AC
  Filled 2017-01-16: qty 1

## 2017-01-16 MED ORDER — GLYCOPYRROLATE 0.2 MG/ML IJ SOLN
INTRAMUSCULAR | Status: AC
  Filled 2017-01-16: qty 1

## 2017-01-16 MED ORDER — MIDAZOLAM HCL 2 MG/2ML IJ SOLN
INTRAMUSCULAR | Status: AC
  Filled 2017-01-16: qty 2

## 2017-01-16 MED ORDER — NEOSTIGMINE METHYLSULFATE 10 MG/10ML IV SOLN
INTRAVENOUS | Status: AC
  Filled 2017-01-16: qty 1

## 2017-01-16 MED ORDER — PROPOFOL 10 MG/ML IV BOLUS
INTRAVENOUS | Status: AC
  Filled 2017-01-16: qty 20

## 2017-01-16 MED ORDER — ROCURONIUM BROMIDE 50 MG/5ML IV SOLN
INTRAVENOUS | Status: AC
  Filled 2017-01-16: qty 1

## 2017-01-17 LAB — IMMUNOFIXATION ELECTROPHORESIS
IgA: 351 mg/dL (ref 64–422)
IgG (Immunoglobin G), Serum: 1026 mg/dL (ref 700–1600)
IgM, Serum: 66 mg/dL (ref 26–217)
Total Protein ELP: 6.4 g/dL (ref 6.0–8.5)

## 2017-01-17 LAB — PROTEIN ELECTROPHORESIS, SERUM
A/G Ratio: 1.1 (ref 0.7–1.7)
ALPHA-1-GLOBULIN: 0.2 g/dL (ref 0.0–0.4)
ALPHA-2-GLOBULIN: 0.9 g/dL (ref 0.4–1.0)
Albumin ELP: 3.3 g/dL (ref 2.9–4.4)
Beta Globulin: 0.9 g/dL (ref 0.7–1.3)
GLOBULIN, TOTAL: 3 g/dL (ref 2.2–3.9)
Gamma Globulin: 1.1 g/dL (ref 0.4–1.8)
Total Protein ELP: 6.3 g/dL (ref 6.0–8.5)

## 2017-01-31 DIAGNOSIS — J449 Chronic obstructive pulmonary disease, unspecified: Secondary | ICD-10-CM | POA: Diagnosis not present

## 2017-02-08 DIAGNOSIS — J449 Chronic obstructive pulmonary disease, unspecified: Secondary | ICD-10-CM | POA: Diagnosis not present

## 2017-02-13 ENCOUNTER — Other Ambulatory Visit (HOSPITAL_COMMUNITY): Payer: Self-pay | Admitting: Oncology

## 2017-02-13 DIAGNOSIS — C9000 Multiple myeloma not having achieved remission: Secondary | ICD-10-CM

## 2017-02-13 MED ORDER — LENALIDOMIDE 10 MG PO CAPS: 10.0000 mg | ORAL_CAPSULE | ORAL | 0 refills | Status: DC

## 2017-02-24 DIAGNOSIS — E1122 Type 2 diabetes mellitus with diabetic chronic kidney disease: Secondary | ICD-10-CM | POA: Diagnosis not present

## 2017-02-24 DIAGNOSIS — I10 Essential (primary) hypertension: Secondary | ICD-10-CM | POA: Diagnosis not present

## 2017-02-24 DIAGNOSIS — N183 Chronic kidney disease, stage 3 (moderate): Secondary | ICD-10-CM | POA: Diagnosis not present

## 2017-02-24 DIAGNOSIS — L89510 Pressure ulcer of right ankle, unstageable: Secondary | ICD-10-CM | POA: Diagnosis not present

## 2017-02-24 DIAGNOSIS — J439 Emphysema, unspecified: Secondary | ICD-10-CM | POA: Diagnosis not present

## 2017-02-27 ENCOUNTER — Encounter (HOSPITAL_COMMUNITY): Payer: Self-pay

## 2017-02-27 DIAGNOSIS — N183 Chronic kidney disease, stage 3 (moderate): Secondary | ICD-10-CM | POA: Diagnosis not present

## 2017-02-27 DIAGNOSIS — H548 Legal blindness, as defined in USA: Secondary | ICD-10-CM | POA: Diagnosis not present

## 2017-02-27 DIAGNOSIS — Z79899 Other long term (current) drug therapy: Secondary | ICD-10-CM | POA: Diagnosis not present

## 2017-02-27 DIAGNOSIS — E119 Type 2 diabetes mellitus without complications: Secondary | ICD-10-CM | POA: Diagnosis not present

## 2017-02-27 DIAGNOSIS — I129 Hypertensive chronic kidney disease with stage 1 through stage 4 chronic kidney disease, or unspecified chronic kidney disease: Secondary | ICD-10-CM | POA: Diagnosis not present

## 2017-02-27 DIAGNOSIS — Z7984 Long term (current) use of oral hypoglycemic drugs: Secondary | ICD-10-CM | POA: Diagnosis not present

## 2017-02-27 DIAGNOSIS — J449 Chronic obstructive pulmonary disease, unspecified: Secondary | ICD-10-CM | POA: Diagnosis not present

## 2017-02-27 DIAGNOSIS — C9000 Multiple myeloma not having achieved remission: Secondary | ICD-10-CM | POA: Diagnosis not present

## 2017-02-27 DIAGNOSIS — L89510 Pressure ulcer of right ankle, unstageable: Secondary | ICD-10-CM | POA: Diagnosis not present

## 2017-02-27 DIAGNOSIS — Z7952 Long term (current) use of systemic steroids: Secondary | ICD-10-CM | POA: Diagnosis not present

## 2017-02-27 DIAGNOSIS — Z9981 Dependence on supplemental oxygen: Secondary | ICD-10-CM | POA: Diagnosis not present

## 2017-02-27 DIAGNOSIS — E1122 Type 2 diabetes mellitus with diabetic chronic kidney disease: Secondary | ICD-10-CM | POA: Diagnosis not present

## 2017-03-02 DIAGNOSIS — J449 Chronic obstructive pulmonary disease, unspecified: Secondary | ICD-10-CM | POA: Diagnosis not present

## 2017-03-03 ENCOUNTER — Encounter (HOSPITAL_COMMUNITY): Payer: Medicare Other | Attending: Oncology

## 2017-03-03 VITALS — BP 113/52 | HR 63 | Temp 98.2°F | Resp 18

## 2017-03-03 DIAGNOSIS — Z452 Encounter for adjustment and management of vascular access device: Secondary | ICD-10-CM | POA: Diagnosis not present

## 2017-03-03 DIAGNOSIS — C9000 Multiple myeloma not having achieved remission: Secondary | ICD-10-CM | POA: Insufficient documentation

## 2017-03-03 DIAGNOSIS — J449 Chronic obstructive pulmonary disease, unspecified: Secondary | ICD-10-CM | POA: Insufficient documentation

## 2017-03-03 DIAGNOSIS — F419 Anxiety disorder, unspecified: Secondary | ICD-10-CM | POA: Insufficient documentation

## 2017-03-03 DIAGNOSIS — Z87891 Personal history of nicotine dependence: Secondary | ICD-10-CM | POA: Insufficient documentation

## 2017-03-03 DIAGNOSIS — Z95828 Presence of other vascular implants and grafts: Secondary | ICD-10-CM

## 2017-03-03 DIAGNOSIS — G893 Neoplasm related pain (acute) (chronic): Secondary | ICD-10-CM | POA: Insufficient documentation

## 2017-03-03 DIAGNOSIS — E538 Deficiency of other specified B group vitamins: Secondary | ICD-10-CM | POA: Insufficient documentation

## 2017-03-03 DIAGNOSIS — R52 Pain, unspecified: Secondary | ICD-10-CM

## 2017-03-03 MED ORDER — SODIUM CHLORIDE 0.9% FLUSH
10.0000 mL | Freq: Once | INTRAVENOUS | Status: AC
  Administered 2017-03-03: 10 mL via INTRAVENOUS

## 2017-03-03 MED ORDER — HEPARIN SOD (PORK) LOCK FLUSH 100 UNIT/ML IV SOLN
500.0000 [IU] | Freq: Once | INTRAVENOUS | Status: AC
  Administered 2017-03-03: 500 [IU] via INTRAVENOUS
  Filled 2017-03-03: qty 5

## 2017-03-03 MED ORDER — HYDROCODONE-ACETAMINOPHEN 10-325 MG PO TABS: 1.0000 | ORAL_TABLET | ORAL | 0 refills | Status: AC | PRN

## 2017-03-03 NOTE — Patient Instructions (Signed)
[place] at [facility] Discharge Instructions  RECOMMENDATIONS MADE BY THE CONSULTANT AND ANY TEST RESULTS WILL BE SENT TO YOUR REFERRING PHYSICIAN.  Port flush done today as scheduled. Tabbatha Bordelon RX for hydrocodone issued today.  Thank you for choosing [place] at [facility] to provide your oncology and hematology care.  To afford each patient quality time with our provider, please arrive at least 15 minutes before your scheduled appointment time.    If you have a lab appointment with the [facility] please come in thru the  Main Entrance and check in at the main information desk  You need to re-schedule your appointment should you arrive 10 or more minutes late.  We strive to give you quality time with our providers, and arriving late affects you and other patients whose appointments are after yours.  Also, if you no show three or more times for appointments you may be dismissed from the clinic at the providers discretion.     Again, thank you for choosing [facility].  Our hope is that these requests will decrease the amount of time that you wait before being seen by our physicians.       _____________________________________________________________  Should you have questions after your visit to [facility], please contact our office at [phone] between the hours of 8:30 a.m. and 4:30 p.m.  Voicemails left after 4:30 p.m. will not be returned until the following business day.  For prescription refill requests, have your pharmacy contact our office.       Resources For Cancer Patients and their Caregivers ? American Cancer Society: Can assist with transportation, wigs, general needs, runs Look Good Feel Better.        (512)431-0315 ? Cancer Care: Provides financial assistance, online support groups, medication/co-pay assistance.  1-800-813-HOPE 2250855912) ? [facility] Assists  [place] cancer patients and their families through emotional , educational and financial support.  8577982091 ? [place] DSS Where to apply for food stamps, Medicaid and utility assistance. (862)756-2433 ? [facility]: Transportation to medical appointments. (928) 475-6124 ? Social Security Administration: May apply for disability if have a Stage IV cancer. 9711803270 662-634-7481 ? [employer], Disability and Transit Services: Assists with nutrition, care and transit needs. [place] Support Programs: @10RELATIVEDAYS @ > Cancer Support Group  2nd Tuesday of the month 1pm-2pm, Journey Room  > Creative Journey  3rd Tuesday of the month 1130am-1pm, Journey Room  > Look Good Feel Better  1st Wednesday of the month 10am-12 noon, Journey Room (Call [place] to register [phone])

## 2017-03-03 NOTE — Progress Notes (Signed)
Brooke Keller presented for Portacath access and flush. Proper placement of portacath confirmed by CXR. Portacath located right chest wall accessed with  H 20 needle. Good blood return present. Portacath flushed with 49ml NS and 500U/26ml Heparin and needle removed intact. Procedure without incident. Patient tolerated procedure well.  [name] RX for hydrocodone given to patient. Stable on discharge home with daughter via wheelchair.

## 2017-03-06 DIAGNOSIS — C9000 Multiple myeloma not having achieved remission: Secondary | ICD-10-CM | POA: Diagnosis not present

## 2017-03-06 DIAGNOSIS — I739 Peripheral vascular disease, unspecified: Secondary | ICD-10-CM | POA: Diagnosis not present

## 2017-03-06 DIAGNOSIS — I10 Essential (primary) hypertension: Secondary | ICD-10-CM | POA: Diagnosis not present

## 2017-03-06 DIAGNOSIS — N182 Chronic kidney disease, stage 2 (mild): Secondary | ICD-10-CM | POA: Diagnosis not present

## 2017-03-06 DIAGNOSIS — E538 Deficiency of other specified B group vitamins: Secondary | ICD-10-CM | POA: Diagnosis not present

## 2017-03-06 DIAGNOSIS — E1322 Other specified diabetes mellitus with diabetic chronic kidney disease: Secondary | ICD-10-CM | POA: Diagnosis not present

## 2017-03-06 DIAGNOSIS — D649 Anemia, unspecified: Secondary | ICD-10-CM | POA: Diagnosis not present

## 2017-03-06 DIAGNOSIS — K219 Gastro-esophageal reflux disease without esophagitis: Secondary | ICD-10-CM | POA: Diagnosis not present

## 2017-03-06 DIAGNOSIS — I70303 Unspecified atherosclerosis of unspecified type of bypass graft(s) of the extremities, bilateral legs: Secondary | ICD-10-CM | POA: Diagnosis not present

## 2017-03-06 DIAGNOSIS — J449 Chronic obstructive pulmonary disease, unspecified: Secondary | ICD-10-CM | POA: Diagnosis not present

## 2017-03-06 DIAGNOSIS — I83013 Varicose veins of right lower extremity with ulcer of ankle: Secondary | ICD-10-CM | POA: Diagnosis not present

## 2017-03-10 DIAGNOSIS — J449 Chronic obstructive pulmonary disease, unspecified: Secondary | ICD-10-CM | POA: Diagnosis not present

## 2017-03-13 DIAGNOSIS — Z7984 Long term (current) use of oral hypoglycemic drugs: Secondary | ICD-10-CM | POA: Diagnosis not present

## 2017-03-13 DIAGNOSIS — C9000 Multiple myeloma not having achieved remission: Secondary | ICD-10-CM | POA: Diagnosis not present

## 2017-03-13 DIAGNOSIS — Z79899 Other long term (current) drug therapy: Secondary | ICD-10-CM | POA: Diagnosis not present

## 2017-03-13 DIAGNOSIS — Z7952 Long term (current) use of systemic steroids: Secondary | ICD-10-CM | POA: Diagnosis not present

## 2017-03-13 DIAGNOSIS — J449 Chronic obstructive pulmonary disease, unspecified: Secondary | ICD-10-CM | POA: Diagnosis not present

## 2017-03-13 DIAGNOSIS — I129 Hypertensive chronic kidney disease with stage 1 through stage 4 chronic kidney disease, or unspecified chronic kidney disease: Secondary | ICD-10-CM | POA: Diagnosis not present

## 2017-03-13 DIAGNOSIS — N183 Chronic kidney disease, stage 3 (moderate): Secondary | ICD-10-CM | POA: Diagnosis not present

## 2017-03-13 DIAGNOSIS — L97313 Non-pressure chronic ulcer of right ankle with necrosis of muscle: Secondary | ICD-10-CM | POA: Diagnosis not present

## 2017-03-13 DIAGNOSIS — Z9981 Dependence on supplemental oxygen: Secondary | ICD-10-CM | POA: Diagnosis not present

## 2017-03-13 DIAGNOSIS — H548 Legal blindness, as defined in USA: Secondary | ICD-10-CM | POA: Diagnosis not present

## 2017-03-13 DIAGNOSIS — L89510 Pressure ulcer of right ankle, unstageable: Secondary | ICD-10-CM | POA: Diagnosis not present

## 2017-03-13 DIAGNOSIS — E1122 Type 2 diabetes mellitus with diabetic chronic kidney disease: Secondary | ICD-10-CM | POA: Diagnosis not present

## 2017-03-14 ENCOUNTER — Ambulatory Visit (HOSPITAL_COMMUNITY)
Admission: RE | Admit: 2017-03-14 | Discharge: 2017-03-14 | Disposition: A | Discharge status: Home / Self Care | Payer: Medicare Other | Source: Ambulatory Visit | Attending: Vascular Surgery | Admitting: Vascular Surgery

## 2017-03-14 ENCOUNTER — Other Ambulatory Visit: Payer: Self-pay | Admitting: *Deleted

## 2017-03-14 DIAGNOSIS — L98499 Non-pressure chronic ulcer of skin of other sites with unspecified severity: Secondary | ICD-10-CM | POA: Diagnosis not present

## 2017-03-14 DIAGNOSIS — L97919 Non-pressure chronic ulcer of unspecified part of right lower leg with unspecified severity: Secondary | ICD-10-CM | POA: Insufficient documentation

## 2017-03-14 LAB — VAS US LOWER EXTREMITY ARTERIAL DUPLEX
LATIBDISTSYS: 28 cm/s
LPTIBDISTSYS: 32 cm/s
LSFPPSV: 50 cm/s
Left super femoral dist sys PSV: -34 cm/s
Left super femoral mid sys PSV: -31 cm/s
RATIBDISTSYS: 48 cm/s
RSFMPSV: -58 cm/s
RTIBDISTSYS: 52 cm/s
Right super femoral dist sys PSV: -64 cm/s
Right super femoral prox sys PSV: -64 cm/s

## 2017-03-17 ENCOUNTER — Other Ambulatory Visit (HOSPITAL_COMMUNITY): Payer: Self-pay | Admitting: Emergency Medicine

## 2017-03-17 ENCOUNTER — Encounter: Payer: Self-pay | Admitting: Vascular Surgery

## 2017-03-17 DIAGNOSIS — C9000 Multiple myeloma not having achieved remission: Secondary | ICD-10-CM

## 2017-03-17 MED ORDER — LENALIDOMIDE 10 MG PO CAPS: 10.0000 mg | ORAL_CAPSULE | ORAL | 0 refills | Status: DC

## 2017-03-18 DIAGNOSIS — L97319 Non-pressure chronic ulcer of right ankle with unspecified severity: Secondary | ICD-10-CM | POA: Diagnosis not present

## 2017-03-19 ENCOUNTER — Other Ambulatory Visit: Payer: Self-pay | Admitting: *Deleted

## 2017-03-19 ENCOUNTER — Encounter: Payer: Self-pay | Admitting: *Deleted

## 2017-03-19 ENCOUNTER — Encounter: Payer: Self-pay | Admitting: Vascular Surgery

## 2017-03-19 ENCOUNTER — Ambulatory Visit (INDEPENDENT_AMBULATORY_CARE_PROVIDER_SITE_OTHER): Payer: Medicare Other | Admitting: Vascular Surgery

## 2017-03-19 VITALS — BP 113/62 | HR 57 | Temp 97.5°F | Resp 18 | Ht <= 58 in | Wt 119.0 lb

## 2017-03-19 DIAGNOSIS — L97909 Non-pressure chronic ulcer of unspecified part of unspecified lower leg with unspecified severity: Secondary | ICD-10-CM

## 2017-03-19 DIAGNOSIS — I872 Venous insufficiency (chronic) (peripheral): Secondary | ICD-10-CM

## 2017-03-19 DIAGNOSIS — I70299 Other atherosclerosis of native arteries of extremities, unspecified extremity: Secondary | ICD-10-CM

## 2017-03-19 NOTE — Progress Notes (Signed)
Patient name: Brooke Keller MRN: [id number] DOB: [birth year] Sex: female   REASON FOR CONSULT:    Ischemic ulcer with rest pain RIGHT leg. The physician requesting the consult is Dr. Nils Pyle.  HPI:   Brooke Keller is a 82 y.o. female, who developed a wound over her right lateral malleolus about 2 months ago. She does not remember any specific injury to the wound. It appears to be in the location for a venous stasis ulcer. She denies any previous history of DVT or phlebitis. She does describe bilateral lower extremity claudication although her activity is very limited. She also has rest pain in the right foot. She denies fever or chills.  Her risk factors for peripheral vascular disease include diabetes, hypertension, hypercholesterolemia, and a remote history of tobacco use. She denies any family history of premature cardiovascular disease.  I have reviewed the records that were sent with the patient from the [facility]. The patient was seen on 02/27/2017 with a nonhealing ulceration of the right lateral ankle. This was felt to be a pressure ulcer. The patient had evidence of significant arterial insufficiency was sent for vascular consultation.  Past Medical History:  Diagnosis Date  . Anemia   . CHF (congestive heart failure) ([place])   . COPD (chronic obstructive pulmonary disease) ([place])   . Diabetes mellitus   . GERD (gastroesophageal reflux disease)   . Hypertension   . IgG multiple myeloma ([place]) 04/14/2016  . Multiple myeloma     History reviewed. No pertinent family history. She denies any family history of premature cardiovascular disease.  SOCIAL HISTORY: She quit smoking 14 years ago. Social History   Social History  . Marital status: Married    Spouse name: Arnell Sieving  . Number of children: N/A  . Years of education: N/A   Occupational History  . Not on file.   Social History Main Topics  . Smoking status: Former Smoker    Quit date: 09/30/2007    . Smokeless tobacco: Former Systems developer    Quit date: 09/30/2006  . Alcohol use No  . Drug use: No  . Sexual activity: Yes    Partners: Female   Other Topics Concern  . Not on file   Social History Narrative  . No narrative on file    Allergies  Allergen Reactions  . Amitriptyline     Other reaction(s): Hallucinations  . Ativan [Lorazepam] Other (See Comments)    hallucinations    Current Outpatient Prescriptions  Medication Sig Dispense Refill  . acyclovir (ZOVIRAX) 800 MG tablet Take 800 mg by mouth 2 (two) times daily.    Marland Kitchen ALPRAZolam (XANAX) 1 MG tablet Take 1 tablet (1 mg total) by mouth 2 (two) times daily as needed for anxiety. 60 tablet 3  . aspirin EC 81 MG tablet Take 81 mg by mouth every morning.     Marland Kitchen atorvastatin (LIPITOR) 10 MG tablet Take 10 mg by mouth every morning.     . brimonidine (ALPHAGAN) 0.2 % ophthalmic solution INSTILL ONE DROP IN EACH EYE THREE TIMES DAILY  99  . Calcium Carbonate-Vitamin D 600-400 MG-UNIT tablet Take 1 tablet by mouth 2 (two) times daily.  4  . calcium-vitamin D (OSCAL WITH D) 500-200 MG-UNIT per tablet Take 1 tablet by mouth 2 (two) times daily.    . dorzolamide-timolol (COSOPT) 22.3-6.8 MG/ML ophthalmic solution Place 1 drop into both eyes 2 (two) times daily.  99  . fluticasone (FLONASE) 50 MCG/ACT nasal spray     .  furosemide (LASIX) 40 MG tablet TAKE ONE TABLET BY MOUTH IN THE MORNING, TAKE 1/2 TABLET IN THE AFTERNOON AND TAKE 1/2 TABLET AT NIGHT  3  . HYDROcodone-acetaminophen (NORCO) 10-325 MG tablet Take 1 tablet by mouth every 4 (four) hours as needed. for pain 90 tablet 0  . ipratropium-albuterol (DUONEB) 0.5-2.5 (3) MG/3ML SOLN Take 3 mLs by nebulization 2 (two) times daily.      Marland Kitchen lenalidomide (REVLIMID) 10 MG capsule Take 1 capsule (10 mg total) by mouth every other day. 14 capsule 0  . LUMIGAN 0.01 % SOLN INSTILL ONE DROP IN EACH EYE AT BEDTIME  99  . metFORMIN (GLUCOPHAGE) 500 MG tablet Take 500 mg by mouth daily with  breakfast.    . Multiple Vitamins-Minerals (MULTIVITAMIN ADULTS 50+ PO) TAKE ONE TABLET BY MOUTH DAILY  4  . nystatin cream (MYCOSTATIN) APPLY TO THE AFFECTED AREA AS NEEDED IRRITATION  1  . polyethylene glycol powder (GLYCOLAX/MIRALAX) powder     . potassium chloride SA (K-DUR,KLOR-CON) 20 MEQ tablet Take 20 mEq by mouth 2 (two) times daily.  2  . predniSONE (DELTASONE) 10 MG tablet Take 10 mg by mouth daily.     Marland Kitchen azithromycin (ZITHROMAX) 250 MG tablet Take 1 tab by mouth daily (Patient not taking: Reported on 03/19/2017) 2 each 0  . benzonatate (TESSALON) 100 MG capsule TAKE ONE CAPSULE BY MOUTH UP TO THREE TIMES DAILY AS NEEDED FOR COUGH  0  . CHERATUSSIN AC 100-10 MG/5ML syrup TAKE ONE TEASPOONFUL (5ML) BY MOUTH TWICE DAILY AS NEEDED FOR 7 DAYS  0  . PERFOROMIST 20 MCG/2ML nebulizer solution Inhale 20 mcg into the lungs 2 (two) times daily.      No current facility-administered medications for this visit.     REVIEW OF SYSTEMS:  _0  denotes positive finding, _1  denotes negative finding Cardiac  Comments:  Chest pain or chest pressure:    Shortness of breath upon exertion:    Short of breath when lying flat:    Irregular heart rhythm:        Vascular    Pain in calf, thigh, or hip brought on by ambulation:    Pain in feet at night that wakes you up from your sleep:     Blood clot in your veins:    Leg swelling:         Pulmonary    Oxygen at home: X   Productive cough:     Wheezing:         Neurologic    Sudden weakness in arms or legs:     Sudden numbness in arms or legs:     Sudden onset of difficulty speaking or slurred speech:    Temporary loss of vision in one eye:     Problems with dizziness:         Gastrointestinal    Blood in stool:     Vomited blood:         Genitourinary    Burning when urinating:     Blood in urine:        Psychiatric    Major depression:         Hematologic    Bleeding problems:    Problems with blood clotting too easily:          Skin    Rashes or ulcers:        Constitutional    Fever or chills:     PHYSICAL EXAM:   Vitals:  03/19/17 1015  BP: 113/62  Pulse: (!) 57  Resp: 18  Temp: 97.5 F (36.4 C)  TempSrc: Oral  SpO2: 100%  Weight: 119 lb (54 kg)  Height: _0  (1.397 m)    GENERAL: The patient is a well-nourished female, in no acute distress. The vital signs are documented above. CARDIAC: There is a regular rate and rhythm.  VASCULAR: I do not detect carotid bruits. She has palpable radial pulses. On the right side, she has a diminished femoral pulse. I cannot palpate a popliteal or pedal pulses. On the left side, I cannot palpate a femoral pulse. I cannot palpate pedal pulses. She does have hyperpigmentation bilaterally consistent with chronic venous insufficiency. PULMONARY: There is good air exchange bilaterally without wheezing or rales. ABDOMEN: Soft and non-tender with normal pitched bowel sounds.  MUSCULOSKELETAL: There are no major deformities or cyanosis. NEUROLOGIC: No focal weakness or paresthesias are detected. SKIN: She has a wound next to her lateral malleolus of the right foot that measures 15 mm in width by 20 mm in length by 4 mm in depth. This goes very close to the bone and tendons. PSYCHIATRIC: The patient has a normal affect.  DATA:    ARTERIAL DOPPLER STUDY: I have reviewed the arterial Doppler study that was done on 03/06/2017.   On the right side, ABI is 50%. Toe pressure on the right is 64 mmHg.  On the left side ABI is 50%. Toe pressure on the left is 28 mmHg.  BILATERAL LOWER EXTREMITY ARTERIAL DUPLEX: I have independently interpreted her bilateral lower extremity arterial duplex scan.  On the right side, there is a monophasic common femoral artery waveform with monophasic signals throughout the entire right lower extremity.  On the left side, there is a monophasic waveform. There are monophasic signals throughout the entire left lower extremity. On the left  there is irregular heterogeneous plaque seen in the distal common femoral artery and mid superficial femoral artery. The study suggests significant aortoiliac occlusive disease.  MEDICAL ISSUES:   PERIPHERAL VASCULAR DISEASE WITH ULCERATION AND CHRONIC VENOUS INSUFFICIENCY: This patient has evidence of multilevel arterial occlusive disease and chronic venous insufficiency. She has a very extensive nonhealing wound on her right lateral malleolus and clearly this is a limb threatening situation given her diabetes, multilevel arterial occlusive disease with a nonhealing ulcer. I think without revascularization this will clearly become a limb threatening situation and she would likely require an above-the-knee amputation. I recommended that we proceed with arteriography.  I have reviewed with the patient the indications for arteriography. In addition, I have reviewed the potential complications of arteriography including but not limited to: Bleeding, arterial injury, arterial thrombosis, dye action, renal insufficiency, or other unpredictable medical problems. I have explained to the patient that if we find disease amenable to angioplasty we could potentially address this at the same time. I have discussed the potential complications of angioplasty and stenting, including but not limited to: Bleeding, arterial thrombosis, arterial injury, dissection, or the need for surgical intervention. This is scheduled for 03/24/2017. I'll make further recommendations pending these results.  Clearly she will be at increased risk for any intervention given her age, oxygen dependency, and multiple medical comorbidities including diabetes and congestive heart failure.   [name] [facility] [phone]

## 2017-03-20 ENCOUNTER — Encounter: Payer: Self-pay | Admitting: Surgery

## 2017-03-24 ENCOUNTER — Ambulatory Visit (HOSPITAL_COMMUNITY)
Admission: RE | Admit: 2017-03-24 | Discharge: 2017-03-24 | Disposition: A | Discharge status: Home / Self Care | Payer: Medicare Other | Source: Ambulatory Visit | Attending: Vascular Surgery | Admitting: Vascular Surgery

## 2017-03-24 ENCOUNTER — Encounter (HOSPITAL_COMMUNITY): Payer: Self-pay | Admitting: Vascular Surgery

## 2017-03-24 ENCOUNTER — Encounter (HOSPITAL_COMMUNITY)
Admission: RE | Disposition: A | Discharge status: Home / Self Care | Payer: Self-pay | Source: Ambulatory Visit | Attending: Vascular Surgery

## 2017-03-24 DIAGNOSIS — Z9981 Dependence on supplemental oxygen: Secondary | ICD-10-CM | POA: Diagnosis not present

## 2017-03-24 DIAGNOSIS — Z7982 Long term (current) use of aspirin: Secondary | ICD-10-CM | POA: Diagnosis not present

## 2017-03-24 DIAGNOSIS — I509 Heart failure, unspecified: Secondary | ICD-10-CM | POA: Insufficient documentation

## 2017-03-24 DIAGNOSIS — I700 Atherosclerosis of aorta: Secondary | ICD-10-CM | POA: Diagnosis not present

## 2017-03-24 DIAGNOSIS — Z8579 Personal history of other malignant neoplasms of lymphoid, hematopoietic and related tissues: Secondary | ICD-10-CM | POA: Insufficient documentation

## 2017-03-24 DIAGNOSIS — J449 Chronic obstructive pulmonary disease, unspecified: Secondary | ICD-10-CM | POA: Diagnosis not present

## 2017-03-24 DIAGNOSIS — Z87891 Personal history of nicotine dependence: Secondary | ICD-10-CM | POA: Insufficient documentation

## 2017-03-24 DIAGNOSIS — Z888 Allergy status to other drugs, medicaments and biological substances status: Secondary | ICD-10-CM | POA: Diagnosis not present

## 2017-03-24 DIAGNOSIS — E1151 Type 2 diabetes mellitus with diabetic peripheral angiopathy without gangrene: Secondary | ICD-10-CM | POA: Insufficient documentation

## 2017-03-24 DIAGNOSIS — E7800 Pure hypercholesterolemia, unspecified: Secondary | ICD-10-CM | POA: Insufficient documentation

## 2017-03-24 DIAGNOSIS — K219 Gastro-esophageal reflux disease without esophagitis: Secondary | ICD-10-CM | POA: Diagnosis not present

## 2017-03-24 DIAGNOSIS — I872 Venous insufficiency (chronic) (peripheral): Secondary | ICD-10-CM | POA: Diagnosis not present

## 2017-03-24 DIAGNOSIS — I771 Stricture of artery: Secondary | ICD-10-CM | POA: Diagnosis not present

## 2017-03-24 DIAGNOSIS — Z7984 Long term (current) use of oral hypoglycemic drugs: Secondary | ICD-10-CM | POA: Insufficient documentation

## 2017-03-24 DIAGNOSIS — I708 Atherosclerosis of other arteries: Secondary | ICD-10-CM | POA: Diagnosis not present

## 2017-03-24 DIAGNOSIS — I70234 Atherosclerosis of native arteries of right leg with ulceration of heel and midfoot: Secondary | ICD-10-CM | POA: Diagnosis not present

## 2017-03-24 DIAGNOSIS — I110 Hypertensive heart disease with heart failure: Secondary | ICD-10-CM | POA: Diagnosis not present

## 2017-03-24 DIAGNOSIS — Z79899 Other long term (current) drug therapy: Secondary | ICD-10-CM | POA: Insufficient documentation

## 2017-03-24 HISTORY — PX: ABDOMINAL AORTOGRAM W/LOWER EXTREMITY: CATH118223

## 2017-03-24 LAB — POCT I-STAT, CHEM 8
BUN: 23 mg/dL — AB (ref 6–20)
CALCIUM ION: 1.17 mmol/L (ref 1.15–1.40)
CHLORIDE: 97 mmol/L — AB (ref 101–111)
Creatinine, Ser: 1.3 mg/dL — ABNORMAL HIGH (ref 0.44–1.00)
Glucose, Bld: 134 mg/dL — ABNORMAL HIGH (ref 65–99)
HCT: 28 % — ABNORMAL LOW (ref 36.0–46.0)
Hemoglobin: 9.5 g/dL — ABNORMAL LOW (ref 12.0–15.0)
POTASSIUM: 3.4 mmol/L — AB (ref 3.5–5.1)
Sodium: 137 mmol/L (ref 135–145)
TCO2: 31 mmol/L (ref 0–100)

## 2017-03-24 LAB — GLUCOSE, CAPILLARY: GLUCOSE-CAPILLARY: 124 mg/dL — AB (ref 65–99)

## 2017-03-24 SURGERY — ABDOMINAL AORTOGRAM W/LOWER EXTREMITY
Anesthesia: LOCAL

## 2017-03-24 MED ORDER — LIDOCAINE HCL (PF) 1 % IJ SOLN
INTRAMUSCULAR | Status: DC | PRN
  Administered 2017-03-24: 15 mL via INTRADERMAL

## 2017-03-24 MED ORDER — HEPARIN (PORCINE) IN NACL 2-0.9 UNIT/ML-% IJ SOLN
INTRAMUSCULAR | Status: AC
  Filled 2017-03-24: qty 1000

## 2017-03-24 MED ORDER — SODIUM CHLORIDE 0.9 % IV SOLN: 1.0000 mL/kg/h | INTRAVENOUS | Status: DC

## 2017-03-24 MED ORDER — HEPARIN (PORCINE) IN NACL 2-0.9 UNIT/ML-% IJ SOLN
INTRAMUSCULAR | Status: AC | PRN
  Administered 2017-03-24: 1000 mL

## 2017-03-24 MED ORDER — LIDOCAINE HCL 1 % IJ SOLN
INTRAMUSCULAR | Status: AC
  Filled 2017-03-24: qty 20

## 2017-03-24 MED ORDER — SODIUM CHLORIDE 0.9 % IV SOLN
INTRAVENOUS | Status: DC
  Administered 2017-03-24: 08:00:00 via INTRAVENOUS

## 2017-03-24 MED ORDER — IODIXANOL 320 MG/ML IV SOLN
INTRAVENOUS | Status: DC | PRN
  Administered 2017-03-24: 45 mL via INTRA_ARTERIAL

## 2017-03-24 SURGICAL SUPPLY — 13 items
CATH ANGIO 5F BER2 65CM (CATHETERS) ×2 IMPLANT
CATH OMNI FLUSH 5F 65CM (CATHETERS) ×2 IMPLANT
CATH SOFT-VU 4F 65 STRAIGHT (CATHETERS) ×1 IMPLANT
CATH SOFT-VU STRAIGHT 4F 65CM (CATHETERS) ×1
GUIDEWIRE ANGLED .035X150CM (WIRE) ×2 IMPLANT
KIT MICROINTRODUCER STIFF 5F (SHEATH) ×2 IMPLANT
KIT PV (KITS) ×2 IMPLANT
SHEATH PINNACLE 5F 10CM (SHEATH) ×2 IMPLANT
SYR MEDRAD MARK V 150ML (SYRINGE) ×2 IMPLANT
TRANSDUCER W/STOPCOCK (MISCELLANEOUS) ×2 IMPLANT
TRAY PV CATH (CUSTOM PROCEDURE TRAY) ×2 IMPLANT
WIRE HITORQ VERSACORE ST 145CM (WIRE) ×2 IMPLANT
WIRE TORQFLEX AUST .018X40CM (WIRE) ×2 IMPLANT

## 2017-03-24 NOTE — Discharge Instructions (Signed)
Hold metformin until  03/27/17   Femoral Site Care Refer to this sheet in the next few weeks. These instructions provide you with information about caring for yourself after your procedure. Your health care provider may also give you more specific instructions. Your treatment has been planned according to current medical practices, but problems sometimes occur. Call your health care provider if you have any problems or questions after your procedure. What can I expect after the procedure? After your procedure, it is typical to have the following:  Bruising at the site that usually fades within 1-2 weeks.  Blood collecting in the tissue (hematoma) that may be painful to the touch. It should usually decrease in size and tenderness within 1-2 weeks.  Follow these instructions at home:  Take medicines only as directed by your health care provider.  You may shower 24-48 hours after the procedure or as directed by your health care provider. Remove the bandage (dressing) and gently wash the site with plain soap and water. Pat the area dry with a clean towel. Do not rub the site, because this may cause bleeding.  Do not take baths, swim, or use a hot tub until your health care provider approves.  Check your insertion site every day for redness, swelling, or drainage.  Do not apply powder or lotion to the site.  Limit use of stairs to twice a day for the first 2-3 days or as directed by your health care provider.  Do not squat for the first 2-3 days or as directed by your health care provider.  Do not lift over 10 lb (4.5 kg) for 5 days after your procedure or as directed by your health care provider.  Ask your health care provider when it is okay to: ? Return to work or school. ? Resume usual physical activities or sports. ? Resume sexual activity.  Do not drive home if you are discharged the same day as the procedure. Have someone else drive you.  You may drive 24 hours after the  procedure unless otherwise instructed by your health care provider.  Do not operate machinery or power tools for 24 hours after the procedure or as directed by your health care provider.  If your procedure was done as an outpatient procedure, which means that you went home the same day as your procedure, a responsible adult should be with you for the first 24 hours after you arrive home.  Keep all follow-up visits as directed by your health care provider. This is important. Contact a health care provider if:  You have a fever.  You have chills.  You have increased bleeding from the site. Hold pressure on the site. Get help right away if:  You have unusual pain at the site.  You have redness, warmth, or swelling at the site.  You have drainage (other than a small amount of blood on the dressing) from the site.  The site is bleeding, and the bleeding does not stop after 30 minutes of holding steady pressure on the site.  Your leg or foot becomes pale, cool, tingly, or numb. This information is not intended to replace advice given to you by your health care provider. Make sure you discuss any questions you have with your health care provider. Document Released: 06/03/2014 Document Revised: 03/07/2016 Document Reviewed: 04/19/2014 Elsevier Interactive Patient Education  [employer].

## 2017-03-24 NOTE — Progress Notes (Signed)
Rennis Harding notified about patient potassium level.

## 2017-03-24 NOTE — Op Note (Signed)
   PATIENT: Brooke Keller      MRN: [id number] DOB: [birth year]    DATE OF PROCEDURE: 03/24/2017  INDICATIONS:    Brooke Keller is a 82 y.o. female with a nonhealing wound on her right lateral malleolus. She has combined peripheral vascular disease and chronic venous insufficiency. She presents for arteriography. Of note she has some mild renal insufficiency, diabetes, a history of congestive heart failure and is on home O2 secondary to COPD.  PROCEDURE:    1. Ultrasound-guided access to the right common femoral artery 2. Retrograde right femoral arteriogram  SURGEON: Judeth Cornfield. Scot Dock, MD, FACS  ANESTHESIA: Local   EBL: Minimal  TECHNIQUE: The patient was brought to the peripheral vascular lab. Given her age I elected not to sedate her. Both groins were prepped and draped in usual sterile fashion. I was unable to palpate a femoral pulse on either side. On the right side, under ultrasound guidance, after the skin was anesthetized, the right common femoral artery was cannulated with a micropuncture needle and a micropuncture wire introduced area of the micropuncture sheath was introduced over the wire. I was unable to advance the Versacore wire and therefore used an angled Glidewire and also a Berenstein catheter for support. Despite multiple attempts I was unable to get through the occluded right common iliac artery and distal aorta. A retrograde right femoral arteriogram was obtained with right lower extremity runoff. At the completion of the procedure the patient was transferred to the holding area for removal of the sheath. No immediate complications were noted.  FINDINGS:   1. The distal aorta and bilateral common iliac arteries are occluded. 2. On the right side, the external iliac artery, hypogastric artery, common femoral artery, deep femoral artery, superficial femoral artery, popliteal artery, anterior tibial artery, tibial peroneal trunk, and posterior tibial arteries are  patent. The peroneal artery is patent proximally but is occluded distally.  CLINICAL NOTE: This patient has a nonhealing wound of the right lateral malleolus. I think without revascularization she will require a above-the-knee amputation given her inflow occlusion on the right. Her only option for limb salvage would be a right axillofemoral bypass. This would be associated with increased risk given her age, diabetes, COPD with oxygen dependency, and congestive heart failure. In addition even the extent of the wound it would be no guarantee of limb salvage. I will bring her into the office to discuss this in more detail.  [name], MD, FACS [facility]  DATE OF DICTATION:   03/24/2017

## 2017-03-24 NOTE — Progress Notes (Signed)
Site area: Right groin a 5 french arterial sheath was removed  Site Prior to Removal:  Level 0  Pressure Applied For 15 MINUTES    Bedrest Beginning at 1005a  Manual:   Yes.    Patient Status During Pull:  stable  Post Pull Groin Site:  Level 0  Post Pull Instructions Given:  Yes.    Post Pull Pulses Present:  Yes.    Dressing Applied:  Yes.    Comments:  VS remain stable during sheath pull

## 2017-03-24 NOTE — Interval H&P Note (Signed)
History and Physical Interval Note:  03/24/2017 8:44 AM  Brooke Keller  has presented today for surgery, with the diagnosis of pvd/ulcer  The various methods of treatment have been discussed with the patient and family. After consideration of risks, benefits and other options for treatment, the patient has consented to  Procedure(s): Abdominal Aortogram w/Lower Extremity (N/A) as a surgical intervention .  The patient's history has been reviewed, patient examined, no change in status, stable for surgery.  I have reviewed the patient's chart and labs.  Questions were answered to the patient's satisfaction.     [name]

## 2017-03-24 NOTE — H&P (View-Only) (Signed)
Patient name: Brooke Keller MRN: [id number] DOB: [birth year] Sex: female   REASON FOR CONSULT:    Ischemic ulcer with rest pain RIGHT leg. The physician requesting the consult is Dr. Nils Pyle.  HPI:   Brooke Keller is a 82 y.o. female, who developed a wound over her right lateral malleolus about 2 months ago. She does not remember any specific injury to the wound. It appears to be in the location for a venous stasis ulcer. She denies any previous history of DVT or phlebitis. She does describe bilateral lower extremity claudication although her activity is very limited. She also has rest pain in the right foot. She denies fever or chills.  Her risk factors for peripheral vascular disease include diabetes, hypertension, hypercholesterolemia, and a remote history of tobacco use. She denies any family history of premature cardiovascular disease.  I have reviewed the records that were sent with the patient from the [place]. The patient was seen on 02/27/2017 with a nonhealing ulceration of the right lateral ankle. This was felt to be a pressure ulcer. The patient had evidence of significant arterial insufficiency was sent for vascular consultation.  Past Medical History:  Diagnosis Date  . Anemia   . CHF (congestive heart failure) ([place])   . COPD (chronic obstructive pulmonary disease) ([place])   . Diabetes mellitus   . GERD (gastroesophageal reflux disease)   . Hypertension   . IgG multiple myeloma ([place]) 04/14/2016  . Multiple myeloma     History reviewed. No pertinent family history. She denies any family history of premature cardiovascular disease.  SOCIAL HISTORY: She quit smoking 14 years ago. Social History   Social History  . Marital status: Married    Spouse name: Arnell Sieving  . Number of children: N/A  . Years of education: N/A   Occupational History  . Not on file.   Social History Main Topics  . Smoking status: Former Smoker    Quit date: 09/30/2007    . Smokeless tobacco: Former Systems developer    Quit date: 09/30/2006  . Alcohol use No  . Drug use: No  . Sexual activity: Yes    Partners: Female   Other Topics Concern  . Not on file   Social History Narrative  . No narrative on file    Allergies  Allergen Reactions  . Amitriptyline     Other reaction(s): Hallucinations  . Ativan [Lorazepam] Other (See Comments)    hallucinations    Current Outpatient Prescriptions  Medication Sig Dispense Refill  . acyclovir (ZOVIRAX) 800 MG tablet Take 800 mg by mouth 2 (two) times daily.    Marland Kitchen ALPRAZolam (XANAX) 1 MG tablet Take 1 tablet (1 mg total) by mouth 2 (two) times daily as needed for anxiety. 60 tablet 3  . aspirin EC 81 MG tablet Take 81 mg by mouth every morning.     Marland Kitchen atorvastatin (LIPITOR) 10 MG tablet Take 10 mg by mouth every morning.     . brimonidine (ALPHAGAN) 0.2 % ophthalmic solution INSTILL ONE DROP IN EACH EYE THREE TIMES DAILY  99  . Calcium Carbonate-Vitamin D 600-400 MG-UNIT tablet Take 1 tablet by mouth 2 (two) times daily.  4  . calcium-vitamin D (OSCAL WITH D) 500-200 MG-UNIT per tablet Take 1 tablet by mouth 2 (two) times daily.    . dorzolamide-timolol (COSOPT) 22.3-6.8 MG/ML ophthalmic solution Place 1 drop into both eyes 2 (two) times daily.  99  . fluticasone (FLONASE) 50 MCG/ACT nasal spray     .  furosemide (LASIX) 40 MG tablet TAKE ONE TABLET BY MOUTH IN THE MORNING, TAKE 1/2 TABLET IN THE AFTERNOON AND TAKE 1/2 TABLET AT NIGHT  3  . HYDROcodone-acetaminophen (NORCO) 10-325 MG tablet Take 1 tablet by mouth every 4 (four) hours as needed. for pain 90 tablet 0  . ipratropium-albuterol (DUONEB) 0.5-2.5 (3) MG/3ML SOLN Take 3 mLs by nebulization 2 (two) times daily.      Marland Kitchen lenalidomide (REVLIMID) 10 MG capsule Take 1 capsule (10 mg total) by mouth every other day. 14 capsule 0  . LUMIGAN 0.01 % SOLN INSTILL ONE DROP IN EACH EYE AT BEDTIME  99  . metFORMIN (GLUCOPHAGE) 500 MG tablet Take 500 mg by mouth daily with  breakfast.    . Multiple Vitamins-Minerals (MULTIVITAMIN ADULTS 50+ PO) TAKE ONE TABLET BY MOUTH DAILY  4  . nystatin cream (MYCOSTATIN) APPLY TO THE AFFECTED AREA AS NEEDED IRRITATION  1  . polyethylene glycol powder (GLYCOLAX/MIRALAX) powder     . potassium chloride SA (K-DUR,KLOR-CON) 20 MEQ tablet Take 20 mEq by mouth 2 (two) times daily.  2  . predniSONE (DELTASONE) 10 MG tablet Take 10 mg by mouth daily.     Marland Kitchen azithromycin (ZITHROMAX) 250 MG tablet Take 1 tab by mouth daily (Patient not taking: Reported on 03/19/2017) 2 each 0  . benzonatate (TESSALON) 100 MG capsule TAKE ONE CAPSULE BY MOUTH UP TO THREE TIMES DAILY AS NEEDED FOR COUGH  0  . CHERATUSSIN AC 100-10 MG/5ML syrup TAKE ONE TEASPOONFUL (5ML) BY MOUTH TWICE DAILY AS NEEDED FOR 7 DAYS  0  . PERFOROMIST 20 MCG/2ML nebulizer solution Inhale 20 mcg into the lungs 2 (two) times daily.      No current facility-administered medications for this visit.     REVIEW OF SYSTEMS:  _0  denotes positive finding, _1  denotes negative finding Cardiac  Comments:  Chest pain or chest pressure:    Shortness of breath upon exertion:    Short of breath when lying flat:    Irregular heart rhythm:        Vascular    Pain in calf, thigh, or hip brought on by ambulation:    Pain in feet at night that wakes you up from your sleep:     Blood clot in your veins:    Leg swelling:         Pulmonary    Oxygen at home: X   Productive cough:     Wheezing:         Neurologic    Sudden weakness in arms or legs:     Sudden numbness in arms or legs:     Sudden onset of difficulty speaking or slurred speech:    Temporary loss of vision in one eye:     Problems with dizziness:         Gastrointestinal    Blood in stool:     Vomited blood:         Genitourinary    Burning when urinating:     Blood in urine:        Psychiatric    Major depression:         Hematologic    Bleeding problems:    Problems with blood clotting too easily:          Skin    Rashes or ulcers:        Constitutional    Fever or chills:     PHYSICAL EXAM:   Vitals:  03/19/17 1015  BP: 113/62  Pulse: (!) 57  Resp: 18  Temp: 97.5 F (36.4 C)  TempSrc: Oral  SpO2: 100%  Weight: 119 lb (54 kg)  Height: _0  (1.397 m)    GENERAL: The patient is a well-nourished female, in no acute distress. The vital signs are documented above. CARDIAC: There is a regular rate and rhythm.  VASCULAR: I do not detect carotid bruits. She has palpable radial pulses. On the right side, she has a diminished femoral pulse. I cannot palpate a popliteal or pedal pulses. On the left side, I cannot palpate a femoral pulse. I cannot palpate pedal pulses. She does have hyperpigmentation bilaterally consistent with chronic venous insufficiency. PULMONARY: There is good air exchange bilaterally without wheezing or rales. ABDOMEN: Soft and non-tender with normal pitched bowel sounds.  MUSCULOSKELETAL: There are no major deformities or cyanosis. NEUROLOGIC: No focal weakness or paresthesias are detected. SKIN: She has a wound next to her lateral malleolus of the right foot that measures 15 mm in width by 20 mm in length by 4 mm in depth. This goes very close to the bone and tendons. PSYCHIATRIC: The patient has a normal affect.  DATA:    ARTERIAL DOPPLER STUDY: I have reviewed the arterial Doppler study that was done on 03/06/2017.   On the right side, ABI is 50%. Toe pressure on the right is 64 mmHg.  On the left side ABI is 50%. Toe pressure on the left is 28 mmHg.  BILATERAL LOWER EXTREMITY ARTERIAL DUPLEX: I have independently interpreted her bilateral lower extremity arterial duplex scan.  On the right side, there is a monophasic common femoral artery waveform with monophasic signals throughout the entire right lower extremity.  On the left side, there is a monophasic waveform. There are monophasic signals throughout the entire left lower extremity. On the left  there is irregular heterogeneous plaque seen in the distal common femoral artery and mid superficial femoral artery. The study suggests significant aortoiliac occlusive disease.  MEDICAL ISSUES:   PERIPHERAL VASCULAR DISEASE WITH ULCERATION AND CHRONIC VENOUS INSUFFICIENCY: This patient has evidence of multilevel arterial occlusive disease and chronic venous insufficiency. She has a very extensive nonhealing wound on her right lateral malleolus and clearly this is a limb threatening situation given her diabetes, multilevel arterial occlusive disease with a nonhealing ulcer. I think without revascularization this will clearly become a limb threatening situation and she would likely require an above-the-knee amputation. I recommended that we proceed with arteriography.  I have reviewed with the patient the indications for arteriography. In addition, I have reviewed the potential complications of arteriography including but not limited to: Bleeding, arterial injury, arterial thrombosis, dye action, renal insufficiency, or other unpredictable medical problems. I have explained to the patient that if we find disease amenable to angioplasty we could potentially address this at the same time. I have discussed the potential complications of angioplasty and stenting, including but not limited to: Bleeding, arterial thrombosis, arterial injury, dissection, or the need for surgical intervention. This is scheduled for 03/24/2017. I'll make further recommendations pending these results.  Clearly she will be at increased risk for any intervention given her age, oxygen dependency, and multiple medical comorbidities including diabetes and congestive heart failure.   [name] [facility] [place] [phone]

## 2017-03-28 ENCOUNTER — Telehealth: Payer: Self-pay | Admitting: Vascular Surgery

## 2017-03-28 NOTE — Telephone Encounter (Signed)
Sched lab 04/15/17 at 11:00 and MD 04/23/17 at 3:00. Lm on hm# for pt to confirm appts.

## 2017-03-28 NOTE — Telephone Encounter (Signed)
-----   Message from Mena Goes, RN sent at 03/24/2017  9:57 AM EDT ----- Regarding: 2-3 weeks    ----- Message ----- From: Angelia Mould, MD Sent: 03/24/2017   9:44 AM To: [facility] Charge Pool Subject: charge and f/u                                 1. Ultrasound-guided access to the right common femoral artery 2. Retrograde right femoral arteriogram  SURGEON: Judeth Cornfield. Scot Dock, MD, FACS  She needs an office visit in 2-3 weeks to discuss above-the-knee amputation versus right axillofemoral bypass grafting. Thank you.

## 2017-04-02 DIAGNOSIS — J449 Chronic obstructive pulmonary disease, unspecified: Secondary | ICD-10-CM | POA: Diagnosis not present

## 2017-04-09 ENCOUNTER — Encounter (HOSPITAL_COMMUNITY): Payer: Medicare Other | Attending: Oncology

## 2017-04-09 ENCOUNTER — Encounter (HOSPITAL_COMMUNITY): Payer: Medicare Other

## 2017-04-09 DIAGNOSIS — E538 Deficiency of other specified B group vitamins: Secondary | ICD-10-CM | POA: Insufficient documentation

## 2017-04-09 DIAGNOSIS — F419 Anxiety disorder, unspecified: Secondary | ICD-10-CM | POA: Insufficient documentation

## 2017-04-09 DIAGNOSIS — C9000 Multiple myeloma not having achieved remission: Secondary | ICD-10-CM | POA: Insufficient documentation

## 2017-04-09 DIAGNOSIS — J449 Chronic obstructive pulmonary disease, unspecified: Secondary | ICD-10-CM | POA: Insufficient documentation

## 2017-04-09 DIAGNOSIS — Z87891 Personal history of nicotine dependence: Secondary | ICD-10-CM | POA: Insufficient documentation

## 2017-04-09 DIAGNOSIS — G893 Neoplasm related pain (acute) (chronic): Secondary | ICD-10-CM | POA: Insufficient documentation

## 2017-04-10 ENCOUNTER — Other Ambulatory Visit (HOSPITAL_COMMUNITY): Payer: Self-pay | Admitting: Emergency Medicine

## 2017-04-10 DIAGNOSIS — J449 Chronic obstructive pulmonary disease, unspecified: Secondary | ICD-10-CM | POA: Diagnosis not present

## 2017-04-10 DIAGNOSIS — C9000 Multiple myeloma not having achieved remission: Secondary | ICD-10-CM

## 2017-04-10 MED ORDER — LENALIDOMIDE 10 MG PO CAPS: 10.0000 mg | ORAL_CAPSULE | ORAL | 0 refills | Status: DC

## 2017-04-10 NOTE — Progress Notes (Signed)
revlimid refilled

## 2017-04-14 ENCOUNTER — Other Ambulatory Visit: Payer: Self-pay | Admitting: Vascular Surgery

## 2017-04-14 ENCOUNTER — Encounter: Payer: Self-pay | Admitting: Vascular Surgery

## 2017-04-14 DIAGNOSIS — I739 Peripheral vascular disease, unspecified: Secondary | ICD-10-CM

## 2017-04-14 DIAGNOSIS — I7025 Atherosclerosis of native arteries of other extremities with ulceration: Secondary | ICD-10-CM

## 2017-04-15 ENCOUNTER — Ambulatory Visit (HOSPITAL_COMMUNITY)
Admission: RE | Admit: 2017-04-15 | Discharge: 2017-04-15 | Disposition: A | Discharge status: Home / Self Care | Payer: Medicare Other | Source: Ambulatory Visit | Attending: Vascular Surgery | Admitting: Vascular Surgery

## 2017-04-15 DIAGNOSIS — I739 Peripheral vascular disease, unspecified: Secondary | ICD-10-CM | POA: Insufficient documentation

## 2017-04-15 DIAGNOSIS — I7025 Atherosclerosis of native arteries of other extremities with ulceration: Secondary | ICD-10-CM

## 2017-04-23 ENCOUNTER — Encounter: Payer: Self-pay | Admitting: Vascular Surgery

## 2017-04-23 ENCOUNTER — Ambulatory Visit (INDEPENDENT_AMBULATORY_CARE_PROVIDER_SITE_OTHER): Payer: Medicare Other | Admitting: Vascular Surgery

## 2017-04-23 VITALS — BP 136/63 | HR 82 | Temp 98.9°F | Resp 20 | Ht <= 58 in | Wt 119.0 lb

## 2017-04-23 DIAGNOSIS — I7409 Other arterial embolism and thrombosis of abdominal aorta: Secondary | ICD-10-CM

## 2017-04-23 DIAGNOSIS — I70299 Other atherosclerosis of native arteries of extremities, unspecified extremity: Secondary | ICD-10-CM

## 2017-04-23 DIAGNOSIS — L97909 Non-pressure chronic ulcer of unspecified part of unspecified lower leg with unspecified severity: Secondary | ICD-10-CM

## 2017-04-23 NOTE — Progress Notes (Signed)
Patient name: Brooke Keller MRN: [id number] DOB: 1934-12-16 Sex: female  REASON FOR VISIT:    Follow up after her arteriogram.  HPI:   Brooke Keller is a pleasant 82 y.o. female who presented with a nonhealing wound on her right lateral malleolus. She has combined peripheral vascular disease and chronic venous insufficiency. She was noted to have some mild renal insufficiency. In addition she has diabetes, history of congestive heart failure, and is on home O2 secondary to COPD.  On 03/24/2017 she underwent CO2 arteriography of the right lower extremity. 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 Below that, on the right side, everything was patent from the external iliac artery down. The peroneal artery was occluded distally. Given the nonhealing wound of the right lateral malleolus with an occluded distal aorta I felt that without revascularization she would require an above-the-knee amputation. Her only option for limb salvage would be a right axillofemoral bypass graft which would be associated with significant risk given her age, diabetes, COPD with oxygen dependency, and congestive heart failure. She comes in to discuss her options.  The wound on the right lateral malleolus has not improved at all. She denies fever or chills. She's now developed a wound on the lateral aspect of her left heel. She does tell me that she does ambulate some.  Of note she has a port in the right subclavian vein which is been for many years. This is flushed occasionally.  Past Medical History:  Diagnosis Date  . Anemia   . CHF (congestive heart failure) ([place])   . COPD (chronic obstructive pulmonary disease) ([facility])   . Diabetes mellitus   . GERD (gastroesophageal reflux disease)   . Hypertension   . IgG multiple myeloma ([place]) 04/14/2016  . Multiple myeloma     No family history on file.  SOCIAL HISTORY: Social History  Substance Use Topics  .  Smoking status: Former Smoker    Quit date: 09/30/2007  . Smokeless tobacco: Former Systems developer    Quit date: 09/30/2006  . Alcohol use No    Allergies  Allergen Reactions  . Amitriptyline     Other reaction(s): Hallucinations  . Ativan [Lorazepam] Other (See Comments)    hallucinations    Current Outpatient Prescriptions  Medication Sig Dispense Refill  . ALPRAZolam (XANAX) 1 MG tablet Take 1 tablet (1 mg total) by mouth 2 (two) times daily as needed for anxiety. 60 tablet 3  . aspirin EC 81 MG tablet Take 81 mg by mouth daily.     Marland Kitchen atorvastatin (LIPITOR) 10 MG tablet Take 10 mg by mouth daily.     Marland Kitchen azithromycin (ZITHROMAX) 250 MG tablet Take 1 tab by mouth daily 2 each 0  . benzonatate (TESSALON) 100 MG capsule TAKE ONE CAPSULE BY MOUTH UP TO THREE TIMES DAILY AS NEEDED FOR COUGH  0  . brimonidine (ALPHAGAN) 0.2 % ophthalmic solution INSTILL ONE DROP IN EACH EYE THREE TIMES DAILY  99  . Calcium Carbonate-Vitamin D 600-400 MG-UNIT tablet Take 1 tablet by mouth 2 (two) times daily.  4  . CHERATUSSIN AC 100-10 MG/5ML syrup TAKE ONE TEASPOONFUL (5ML) BY MOUTH TWICE DAILY AS NEEDED FOR 7 DAYS  0  . dorzolamide-timolol (COSOPT) 22.3-6.8 MG/ML ophthalmic solution Place 1 drop into both eyes 2 (two) times daily.  99  . fluticasone (FLONASE) 50 MCG/ACT nasal spray Place 2 sprays into both nostrils daily as needed for rhinitis (DRYNESS).     Marland Kitchen  furosemide (LASIX) 40 MG tablet TAKE ONE TABLET BY MOUTH IN THE MORNING, AND 1 AT NIGHT  3  . HYDROcodone-acetaminophen (NORCO) 10-325 MG tablet Take 1 tablet by mouth every 4 (four) hours as needed. for pain 90 tablet 0  . ipratropium-albuterol (DUONEB) 0.5-2.5 (3) MG/3ML SOLN Take 3 mLs by nebulization 2 (two) times daily.      Marland Kitchen lenalidomide (REVLIMID) 10 MG capsule Take 1 capsule (10 mg total) by mouth every other day. 14 capsule 0  . LUMIGAN 0.01 % SOLN INSTILL ONE DROP IN EACH EYE AT BEDTIME  99  . metFORMIN (GLUCOPHAGE) 500 MG tablet Take 500 mg by  mouth daily with breakfast.    . Multiple Vitamins-Minerals (MULTIVITAMIN ADULTS 50+ PO) TAKE ONE TABLET BY MOUTH DAILY  4  . nystatin cream (MYCOSTATIN) APPLY TO THE AFFECTED AREA  DAILY AS NEEDED IRRITATION  1  . polyethylene glycol powder (GLYCOLAX/MIRALAX) powder Take 1 Container by mouth daily as needed for mild constipation.     . potassium chloride SA (K-DUR,KLOR-CON) 20 MEQ tablet Take 20 mEq by mouth 2 (two) times daily.  2  . predniSONE (DELTASONE) 10 MG tablet Take 10 mg by mouth daily.      No current facility-administered medications for this visit.     REVIEW OF SYSTEMS:  _0  denotes positive finding, _1  denotes negative finding Cardiac  Comments:  Chest pain or chest pressure:    Shortness of breath upon exertion: X   Short of breath when lying flat:    Irregular heart rhythm:        Vascular    Pain in calf, thigh, or hip brought on by ambulation:    Pain in feet at night that wakes you up from your sleep:     Blood clot in your veins:    Leg swelling:         Pulmonary    Oxygen at home: X   Productive cough:     Wheezing:         Neurologic    Sudden weakness in arms or legs:     Sudden numbness in arms or legs:     Sudden onset of difficulty speaking or slurred speech:    Temporary loss of vision in one eye:     Problems with dizziness:         Gastrointestinal    Blood in stool:     Vomited blood:         Genitourinary    Burning when urinating:     Blood in urine:        Psychiatric    Major depression:         Hematologic    Bleeding problems:    Problems with blood clotting too easily:        Skin    Rashes or ulcers: X Right lateral malleolus. Lateral left heel.       Constitutional    Fever or chills:     PHYSICAL EXAM:   Vitals:   04/23/17 1439  BP: 136/63  Pulse: 82  Resp: 20  Temp: 98.9 F (37.2 C)  TempSrc: Oral  SpO2: 98%  Weight: 119 lb (54 kg)  Height: _2  (1.397 m)    GENERAL: The patient is a well-nourished  female, in no acute distress. The vital signs are documented above. CARDIAC: There is a regular rate and rhythm.  VASCULAR: I do not detect carotid bruits. I cannot palpate femoral pulses or  pedal pulses. PULMONARY: There is good air exchange bilaterally without wheezing or rales. ABDOMEN: Soft and non-tender with normal pitched bowel sounds.  MUSCULOSKELETAL: There are no major deformities or cyanosis. NEUROLOGIC: No focal weakness or paresthesias are detected. SKIN: there is a wound on the lateral malleolus which measures approximately 2 cm in diameter. This is very close to the underlying tendon and bone. There is a wound that is dry on the lateral aspect of the left heel which measures approximately 1.5 cm in diameter.  PSYCHIATRIC: The patient has a normal affect.  DATA:    ARTERIOGRAM: The results of her arteriogram are discussed above.  MEDICAL ISSUES:   AORTIC OCCLUSION WITH BILATERAL LOWER EXTREMITY WOUNDS:  This patient has an occluded aorta with wounds on both lower extremities which are in difficult locations. Without revascularization I think she will require an above-the-knee amputation on the right, and if the wound progresses on the left she could also potentially require amputation in the future. The only option for limb salvage would be a right axillobifemoral bypass graft although this would be associated with significant risk given her multiple medical comorbidities including diabetes, oxygen dependent COPD, diabetes, and congestive heart failure.  In addition she is 82 years old. I have also explained that even with revascularization, if this is successful, given the extent of the wound on the right lateral malleolus there is significant risk of limb loss. Her best chance for sex S would likely be debridement of the wound and placement of a negative pressure dressing.   The situation is further complicated in that she has developed a wound on her left heel which is also  difficult location.  Her daughters are with her today and they would favor pursuing revascularization understanding that this is associated with significant risk. I do not think this is totally unreasonable given that she will otherwise likely require bilateral AKA's and there is risk associated with this also. We will obtain preoperative cardiac clearance and I have also asked them to speak with her pulmonologist closer to home to see if she needs to be seen preoperatively from their standpoint. Once her preoperative workup is complete we will schedule for surgery. Tentatively we have her scheduled for 05/20/2017. She will require right axillobifemoral bypass and debridement of the right lateral malleolar wound.  [name] [place] [phone]

## 2017-04-25 ENCOUNTER — Telehealth: Payer: Self-pay | Admitting: Vascular Surgery

## 2017-04-25 DIAGNOSIS — J449 Chronic obstructive pulmonary disease, unspecified: Secondary | ICD-10-CM | POA: Diagnosis not present

## 2017-04-25 DIAGNOSIS — D649 Anemia, unspecified: Secondary | ICD-10-CM | POA: Diagnosis not present

## 2017-04-25 DIAGNOSIS — C9000 Multiple myeloma not having achieved remission: Secondary | ICD-10-CM | POA: Diagnosis not present

## 2017-04-25 NOTE — Telephone Encounter (Signed)
Per Dr.Dickson's instructions, I scheduled this patient for an appt w/ [facility] on 05/06/17 at 2:30pm. They ask that the patient arrive at 2:15pm at the [address] location. The patient's daughter is aware of the appt time/date. awt

## 2017-04-28 ENCOUNTER — Encounter (HOSPITAL_COMMUNITY): Payer: Self-pay

## 2017-05-02 DIAGNOSIS — J449 Chronic obstructive pulmonary disease, unspecified: Secondary | ICD-10-CM | POA: Diagnosis not present

## 2017-05-05 ENCOUNTER — Other Ambulatory Visit: Payer: Self-pay

## 2017-05-05 DIAGNOSIS — Z95828 Presence of other vascular implants and grafts: Secondary | ICD-10-CM | POA: Diagnosis not present

## 2017-05-05 DIAGNOSIS — J449 Chronic obstructive pulmonary disease, unspecified: Secondary | ICD-10-CM | POA: Diagnosis not present

## 2017-05-05 DIAGNOSIS — Z5111 Encounter for antineoplastic chemotherapy: Secondary | ICD-10-CM | POA: Diagnosis not present

## 2017-05-05 DIAGNOSIS — D649 Anemia, unspecified: Secondary | ICD-10-CM | POA: Diagnosis not present

## 2017-05-05 DIAGNOSIS — C9001 Multiple myeloma in remission: Secondary | ICD-10-CM | POA: Diagnosis not present

## 2017-05-05 DIAGNOSIS — G893 Neoplasm related pain (acute) (chronic): Secondary | ICD-10-CM | POA: Diagnosis not present

## 2017-05-06 ENCOUNTER — Encounter (HOSPITAL_COMMUNITY): Payer: Self-pay

## 2017-05-06 ENCOUNTER — Ambulatory Visit (INDEPENDENT_AMBULATORY_CARE_PROVIDER_SITE_OTHER): Payer: Medicare Other | Admitting: Internal Medicine

## 2017-05-06 ENCOUNTER — Encounter: Payer: Self-pay | Admitting: Internal Medicine

## 2017-05-06 ENCOUNTER — Ambulatory Visit: Payer: Self-pay | Admitting: Internal Medicine

## 2017-05-06 VITALS — BP 106/60 | HR 73 | Ht <= 58 in | Wt 119.4 lb

## 2017-05-06 DIAGNOSIS — J449 Chronic obstructive pulmonary disease, unspecified: Secondary | ICD-10-CM

## 2017-05-06 DIAGNOSIS — I739 Peripheral vascular disease, unspecified: Secondary | ICD-10-CM

## 2017-05-06 DIAGNOSIS — Z01818 Encounter for other preprocedural examination: Secondary | ICD-10-CM | POA: Insufficient documentation

## 2017-05-06 DIAGNOSIS — Z01810 Encounter for preprocedural cardiovascular examination: Secondary | ICD-10-CM | POA: Diagnosis not present

## 2017-05-06 NOTE — Patient Instructions (Signed)
Medication Instructions:  Your physician recommends that you continue on your current medications as directed. Please refer to the Current Medication list given to you today.  Labwork: NONE  Testing/Procedures: Your physician has requested that you have a lexiscan myoview. For further information please visit [email or web address]. Please follow instruction sheet, as given.  Follow-Up: AS NEEDED    

## 2017-05-06 NOTE — Progress Notes (Signed)
OFFICE CONSULT NOTE  Chief Complaint:  Preoperative cardiac evaluation  Primary Care Physician: Curlene Labrum, MD  HPI:  Brooke Keller is a 82 y.o. female who is being seen today for the evaluation of preoperative cardiac evaluation at the request of Angelia Mould,*. Mrs. Shippey presents for preoperative clearance for upcoming right axillobifemoral bypass graft. Cardiovascular risk factors include COPD on oxygen, type 2 diabetes, hypertension, dyslipidemia, congestive heart failure, history of multiple myeloma and long-standing smoking history. As this is a very high risk procedure, she is being appropriately screened for her cardiovascular risk. Unfortunately, given her comorbidities she cannot ambulate and the fact that she has nonhealing arterial ulcers on both her left heel and right heel and right lateral malleolus. She denies any chest pain but reports shortness of breath which is constant.  PMHx:  Past Medical History:  Diagnosis Date  . Anemia   . CHF (congestive heart failure) ([place])   . COPD (chronic obstructive pulmonary disease) ([place])   . Diabetes mellitus   . GERD (gastroesophageal reflux disease)   . Hypertension   . IgG multiple myeloma ([place]) 04/14/2016  . Multiple myeloma     Past Surgical History:  Procedure Laterality Date  . ABDOMINAL AORTOGRAM W/LOWER EXTREMITY N/A 03/24/2017   Procedure: Abdominal Aortogram w/Lower Extremity;  Surgeon: Angelia Mould, MD;  Location: [facility];  Service: Cardiovascular;  Laterality: N/A;  . carpal tunne    . KYPHOPLASTY    . PORTACATH PLACEMENT    . right fifth toe      FAMHx:  Family History  Problem Relation Age of Onset  . Heart disease Mother     SOCHx:   reports that she quit smoking about 9 years ago. She quit smokeless tobacco use about 10 years ago. She reports that she does not drink alcohol or use drugs.  ALLERGIES:  Allergies  Allergen Reactions  . Amitriptyline     Other  reaction(s): Hallucinations  . Ativan [Lorazepam] Other (See Comments)    hallucinations    ROS: Pertinent items noted in HPI and remainder of comprehensive ROS otherwise negative.  HOME MEDS: Current Outpatient Prescriptions on File Prior to Visit  Medication Sig Dispense Refill  . ALPRAZolam (XANAX) 1 MG tablet Take 1 tablet (1 mg total) by mouth 2 (two) times daily as needed for anxiety. 60 tablet 3  . aspirin EC 81 MG tablet Take 81 mg by mouth daily.     Marland Kitchen atorvastatin (LIPITOR) 10 MG tablet Take 10 mg by mouth daily.     . brimonidine (ALPHAGAN) 0.2 % ophthalmic solution INSTILL ONE DROP IN EACH EYE THREE TIMES DAILY  99  . Calcium Carbonate-Vitamin D 600-400 MG-UNIT tablet Take 1 tablet by mouth 2 (two) times daily.  4  . dorzolamide-timolol (COSOPT) 22.3-6.8 MG/ML ophthalmic solution Place 1 drop into both eyes 2 (two) times daily.  99  . fluticasone (FLONASE) 50 MCG/ACT nasal spray Place 2 sprays into both nostrils daily as needed for rhinitis (DRYNESS).     . furosemide (LASIX) 40 MG tablet TAKE ONE TABLET BY MOUTH IN THE MORNING, AND 1 AT NIGHT  3  . HYDROcodone-acetaminophen (NORCO) 10-325 MG tablet Take 1 tablet by mouth every 4 (four) hours as needed. for pain 90 tablet 0  . ipratropium-albuterol (DUONEB) 0.5-2.5 (3) MG/3ML SOLN Take 3 mLs by nebulization 2 (two) times daily.      Marland Kitchen lenalidomide (REVLIMID) 10 MG capsule Take 1 capsule (10 mg total) by  mouth every other day. 14 capsule 0  . LUMIGAN 0.01 % SOLN INSTILL ONE DROP IN EACH EYE AT BEDTIME  99  . metFORMIN (GLUCOPHAGE) 500 MG tablet Take 500 mg by mouth daily with breakfast.    . Multiple Vitamins-Minerals (MULTIVITAMIN ADULTS 50+ PO) TAKE ONE TABLET BY MOUTH DAILY  4  . nystatin cream (MYCOSTATIN) APPLY TO THE AFFECTED AREA  DAILY AS NEEDED IRRITATION  1  . polyethylene glycol powder (GLYCOLAX/MIRALAX) powder Take 1 Container by mouth daily as needed for mild constipation.     . potassium chloride SA  (K-DUR,KLOR-CON) 20 MEQ tablet Take 20 mEq by mouth 2 (two) times daily.  2  . predniSONE (DELTASONE) 10 MG tablet Take 10 mg by mouth daily.      No current facility-administered medications on file prior to visit.     LABS/IMAGING: No results found for this or any previous visit (from the past 48 hour(s)). No results found.  LIPID PANEL: No results found for: CHOL, TRIG, HDL, CHOLHDL, VLDL, LDLCALC, LDLDIRECT  WEIGHTS: Wt Readings from Last 3 Encounters:  05/06/17 119 lb 6.4 oz (54.2 kg)  04/23/17 119 lb (54 kg)  03/24/17 119 lb (54 kg)    VITALS: BP 106/60   Pulse 73   Ht 4' 7"  (1.397 m)   Wt 119 lb 6.4 oz (54.2 kg)   BMI 27.75 kg/m   EXAM: General appearance: alert, appears older than stated age and no distress Neck: no carotid bruit, no JVD and thyroid not enlarged, symmetric, no tenderness/mass/nodules Lungs: diminished breath sounds bilaterally Heart: regular rate and rhythm Abdomen: soft, non-tender; bowel sounds normal; no masses,  no organomegaly Extremities: venous stasis dermatitis noted and 1+ right lower extremity and trace left lower extremity edema, nonhealing ulcers of the heels and right lateral malleolus noted Pulses: Faint, barely palpable Skin: Pale, cool, dry Neurologic: Mental status: Answers questions, but drifts off to sleep during the exam Psych: Pleasant  EKG: Normal sinus rhythm with sinus arrhythmia at 73, RBBB - personally reviewed  ASSESSMENT: 1. Indeterminate preoperative risk for high risk vascular surgery 2. COPD - on oxygen therapy  3. History of CHF 4. Hypertension 5. Dyslipidemia 6. Type 2 diabetes 7. Former smoker  PLAN: 1.   Mrs. Much has numerous cardiovascular risk factors and is possibly undergoing a high risk vascular procedure for attempt at limb salvage. She cannot do 4 metabolic equivalents of activity to assess her cardiovascular risk therefore I would recommend a Lexiscan Myoview. If this is a low risk study then  she could likely proceed safely with a surgery at an acceptable risk.  Thanks for the kind referral. We'll be in contact with the results of her stress test and see her back on an as-needed basis. She could follow-up for cardiovascular risk modification with our cardiologists in [place] which would be more convenient since she lives there.  Pixie Casino, MD, [place]  Attending Cardiologist  Direct Dial: [phone]  Fax: [phone]  Website:  [email or web address] Osgood [place] Maiolo 05/06/2017, 5:28 PM

## 2017-05-06 NOTE — Pre-Procedure Instructions (Signed)
Brooke Keller  05/06/2017      [facility] - [address], [place], [place] - [address] [place] [address] Phone: [phone] Fax: [phone]    Your procedure is scheduled on May 20, 2017.  Report to [facility] Admitting at 5:30 A.M.  Call this number if you have problems the morning of surgery:  [phone]   Call [phone] if you have any questions prior to your surgery date Monday-Friday 8am-4pm   Remember:  Do not eat food or drink liquids after midnight.  Take these medicines the morning of surgery with A SIP OF WATER Alprazolam (Xanax) if needed, Eye drops, Flonase nasal spray if needed, Hydrocodone-Acetaminophen (Norco) if needed, Duoneb nebulizer, Prednisone.  7 days prior to surgery STOP taking any Aleve, Naproxen, Ibuprofen, Motrin, Advil, Goody's, BC's, all herbal medications, fish oil, and all vitamins.  STOP taking Aspirin as directed by your doctor.   Do not wear jewelry, make-up or nail polish.  Do not wear lotions, powders, or perfumes, or deoderant.  Do not shave 48 hours prior to surgery.    Do not bring valuables to the hospital.   [facility] is not responsible for any belongings or valuables.  Contacts, dentures or bridgework may not be worn into surgery.  Leave your suitcase in the car.  After surgery it may be brought to your room.  For patients admitted to the hospital, discharge time will be determined by your treatment team.  Patients discharged the day of surgery will not be allowed to drive home.      How to Manage Your Diabetes Before and After Surgery  Why is it important to control my blood sugar before and after surgery? . Improving blood sugar levels before and after surgery helps healing and can limit problems. . A way of improving blood sugar control is eating a healthy diet by: o  Eating less sugar and carbohydrates o  Increasing activity/exercise o  Talking with your doctor about  reaching your blood sugar goals . High blood sugars (greater than 180 mg/dL) can raise your risk of infections and slow your recovery, so you will need to focus on controlling your diabetes during the weeks before surgery. . Make sure that the doctor who takes care of your diabetes knows about your planned surgery including the date and location.  How do I manage my blood sugar before surgery? . Check your blood sugar at least 4 times a day, starting 2 days before surgery, to make sure that the level is not too high or low. o Check your blood sugar the morning of your surgery when you wake up and every 2 hours until you get to the Short Stay unit. . If your blood sugar is less than 70 mg/dL, you will need to treat for low blood sugar: o Do not take insulin. o Treat a low blood sugar (less than 70 mg/dL) with  cup of clear juice (cranberry or apple), 4 glucose tablets, OR glucose gel. o Recheck blood sugar in 15 minutes after treatment (to make sure it is greater than 70 mg/dL). If your blood sugar is not greater than 70 mg/dL on recheck, call [phone] for further instructions. . Report your blood sugar to the short stay nurse when you get to Short Stay.  . If you are admitted to the hospital after surgery: o Your blood sugar will be checked by the staff and you will probably be given  insulin after surgery (instead of oral diabetes medicines) to make sure you have good blood sugar levels. o The goal for blood sugar control after surgery is 80-180 mg/dL.       WHAT DO I DO ABOUT MY DIABETES MEDICATION?   Marland Kitchen Do not take oral diabetes medicines (pills) the morning of surgery.  Do NOT take Metformin (Glucophage) this morning of surgery.    Patient Signature:  Date:   Nurse Signature:  Date:   Reviewed and Endorsed by [facility] Patient Education Committee, August 2015  Special instructions:   [facility]- Preparing For Surgery  Before surgery, you can play an important role.  Because skin is not sterile, your skin needs to be as free of germs as possible. You can reduce the number of germs on your skin by washing with CHG (chlorahexidine gluconate) Soap before surgery.  CHG is an antiseptic cleaner which kills germs and bonds with the skin to continue killing germs even after washing.  Please do not use if you have an allergy to CHG or antibacterial soaps. If your skin becomes reddened/irritated stop using the CHG.  Do not shave (including legs and underarms) for at least 48 hours prior to first CHG shower. It is OK to shave your face.  Please follow these instructions carefully.   1. Shower the NIGHT BEFORE SURGERY and the MORNING OF SURGERY with CHG.   2. If you chose to wash your hair, wash your hair first as usual with your normal shampoo.  3. After you shampoo, rinse your hair and body thoroughly to remove the shampoo.  4. Use CHG as you would any other liquid soap. You can apply CHG directly to the skin and wash gently with a scrungie or a clean washcloth.   5. Apply the CHG Soap to your body ONLY FROM THE NECK DOWN.  Do not use on open wounds or open sores. Avoid contact with your eyes, ears, mouth and genitals (private parts). Wash genitals (private parts) with your normal soap.  6. Wash thoroughly, paying special attention to the area where your surgery will be performed.  7. Thoroughly rinse your body with warm water from the neck down.  8. DO NOT shower/wash with your normal soap after using and rinsing off the CHG Soap.  9. Pat yourself dry with a CLEAN TOWEL.   10. Wear CLEAN PAJAMAS   11. Place CLEAN SHEETS on your bed the night of your first shower and DO NOT SLEEP WITH PETS.    Day of Surgery: Do not apply any deodorants/lotions. Please wear clean clothes to the hospital/surgery center.      Please read over the following fact sheets that you were given. Pain Booklet, Coughing and Deep Breathing, MRSA Information and Surgical Site  Infection Prevention

## 2017-05-07 ENCOUNTER — Telehealth (HOSPITAL_COMMUNITY): Payer: Self-pay

## 2017-05-07 ENCOUNTER — Encounter (HOSPITAL_COMMUNITY)
Admission: RE | Admit: 2017-05-07 | Discharge: 2017-05-07 | Disposition: A | Discharge status: Home / Self Care | Payer: Medicare Other | Source: Ambulatory Visit | Attending: Vascular Surgery | Admitting: Vascular Surgery

## 2017-05-07 ENCOUNTER — Encounter (HOSPITAL_COMMUNITY): Payer: Self-pay

## 2017-05-07 DIAGNOSIS — Z7984 Long term (current) use of oral hypoglycemic drugs: Secondary | ICD-10-CM | POA: Diagnosis not present

## 2017-05-07 DIAGNOSIS — Z9981 Dependence on supplemental oxygen: Secondary | ICD-10-CM | POA: Diagnosis not present

## 2017-05-07 DIAGNOSIS — Z7982 Long term (current) use of aspirin: Secondary | ICD-10-CM | POA: Diagnosis not present

## 2017-05-07 DIAGNOSIS — Z87891 Personal history of nicotine dependence: Secondary | ICD-10-CM | POA: Diagnosis not present

## 2017-05-07 DIAGNOSIS — Z0183 Encounter for blood typing: Secondary | ICD-10-CM | POA: Diagnosis not present

## 2017-05-07 DIAGNOSIS — Z7951 Long term (current) use of inhaled steroids: Secondary | ICD-10-CM | POA: Diagnosis not present

## 2017-05-07 DIAGNOSIS — E1151 Type 2 diabetes mellitus with diabetic peripheral angiopathy without gangrene: Secondary | ICD-10-CM | POA: Diagnosis not present

## 2017-05-07 DIAGNOSIS — Z01812 Encounter for preprocedural laboratory examination: Secondary | ICD-10-CM | POA: Diagnosis not present

## 2017-05-07 DIAGNOSIS — C9001 Multiple myeloma in remission: Secondary | ICD-10-CM | POA: Diagnosis not present

## 2017-05-07 DIAGNOSIS — Z79899 Other long term (current) drug therapy: Secondary | ICD-10-CM | POA: Diagnosis not present

## 2017-05-07 DIAGNOSIS — K219 Gastro-esophageal reflux disease without esophagitis: Secondary | ICD-10-CM | POA: Diagnosis not present

## 2017-05-07 DIAGNOSIS — J449 Chronic obstructive pulmonary disease, unspecified: Secondary | ICD-10-CM | POA: Diagnosis not present

## 2017-05-07 DIAGNOSIS — Z8249 Family history of ischemic heart disease and other diseases of the circulatory system: Secondary | ICD-10-CM | POA: Diagnosis not present

## 2017-05-07 DIAGNOSIS — Z01818 Encounter for other preprocedural examination: Secondary | ICD-10-CM | POA: Diagnosis not present

## 2017-05-07 DIAGNOSIS — Z01810 Encounter for preprocedural cardiovascular examination: Secondary | ICD-10-CM | POA: Diagnosis not present

## 2017-05-07 DIAGNOSIS — I509 Heart failure, unspecified: Secondary | ICD-10-CM | POA: Diagnosis not present

## 2017-05-07 DIAGNOSIS — R9439 Abnormal result of other cardiovascular function study: Secondary | ICD-10-CM | POA: Diagnosis not present

## 2017-05-07 DIAGNOSIS — Z7952 Long term (current) use of systemic steroids: Secondary | ICD-10-CM | POA: Diagnosis not present

## 2017-05-07 DIAGNOSIS — I110 Hypertensive heart disease with heart failure: Secondary | ICD-10-CM | POA: Diagnosis not present

## 2017-05-07 HISTORY — DX: Dependence on supplemental oxygen: Z99.81

## 2017-05-07 HISTORY — DX: Peripheral vascular disease, unspecified: I73.9

## 2017-05-07 LAB — COMPREHENSIVE METABOLIC PANEL
ALK PHOS: 57 U/L (ref 38–126)
ALT: 17 U/L (ref 14–54)
AST: 21 U/L (ref 15–41)
Albumin: 3.6 g/dL (ref 3.5–5.0)
Anion gap: 11 (ref 5–15)
BUN: 16 mg/dL (ref 6–20)
CALCIUM: 9.4 mg/dL (ref 8.9–10.3)
CO2: 26 mmol/L (ref 22–32)
CREATININE: 1.29 mg/dL — AB (ref 0.44–1.00)
Chloride: 101 mmol/L (ref 101–111)
GFR, EST AFRICAN AMERICAN: 44 mL/min — AB (ref >60)
GFR, EST NON AFRICAN AMERICAN: 38 mL/min — AB (ref >60)
Glucose, Bld: 166 mg/dL — ABNORMAL HIGH (ref 65–99)
Potassium: 3.8 mmol/L (ref 3.5–5.1)
Sodium: 138 mmol/L (ref 135–145)
TOTAL PROTEIN: 7.6 g/dL (ref 6.5–8.1)
Total Bilirubin: 0.6 mg/dL (ref 0.3–1.2)

## 2017-05-07 LAB — URINALYSIS, ROUTINE W REFLEX MICROSCOPIC
Bilirubin Urine: NEGATIVE
GLUCOSE, UA: 50 mg/dL — AB
Hgb urine dipstick: NEGATIVE
Ketones, ur: NEGATIVE mg/dL
LEUKOCYTES UA: NEGATIVE
Nitrite: NEGATIVE
PH: 5 (ref 5.0–8.0)
PROTEIN: NEGATIVE mg/dL
SPECIFIC GRAVITY, URINE: 1.008 (ref 1.005–1.030)

## 2017-05-07 LAB — APTT: aPTT: 30 seconds (ref 24–36)

## 2017-05-07 LAB — ABO/RH: ABO/RH(D): A POS

## 2017-05-07 LAB — SURGICAL PCR SCREEN
MRSA, PCR: POSITIVE — AB
Staphylococcus aureus: POSITIVE — AB

## 2017-05-07 LAB — CBC
HCT: 29.7 % — ABNORMAL LOW (ref 36.0–46.0)
Hemoglobin: 9 g/dL — ABNORMAL LOW (ref 12.0–15.0)
MCH: 27.4 pg (ref 26.0–34.0)
MCHC: 30.3 g/dL (ref 30.0–36.0)
MCV: 90.5 fL (ref 78.0–100.0)
PLATELETS: 195 10*3/uL (ref 150–400)
RBC: 3.28 MIL/uL — ABNORMAL LOW (ref 3.87–5.11)
RDW: 16 % — ABNORMAL HIGH (ref 11.5–15.5)
WBC: 5.9 10*3/uL (ref 4.0–10.5)

## 2017-05-07 LAB — PROTIME-INR
INR: 1.06
Prothrombin Time: 13.8 seconds (ref 11.4–15.2)

## 2017-05-07 LAB — GLUCOSE, CAPILLARY: Glucose-Capillary: 164 mg/dL — ABNORMAL HIGH (ref 65–99)

## 2017-05-07 NOTE — Telephone Encounter (Signed)
Encounter complete. 

## 2017-05-07 NOTE — Progress Notes (Signed)
PCP - Judd Lien Cardiologist - Dr. Debara Pickett saw for the first time yesterday  Chest x-ray - 09/17/16 EKG - 05/06/17 Stress Test - 05/08/17 pending ECHO - denies Cardiac Cath - PVC 03/24/17   Fasting Blood Sugar - 112-120 Checks Blood Sugar __1___ times a day   Sending to anesthesia for review.  Pending stress test, awaiting aspirin instructions from surgeons office, office called and message left  patinet has an open wound to the right foot/ankle Dr. Scot Dock is aware of that and has seen it   Patient denies shortness of breath, fever, cough and chest pain at PAT appointment   Patient verbalized understanding of instructions that were given to them at the PAT appointment. Patient was also instructed that they will need to review over the PAT instructions again at home before surgery.

## 2017-05-07 NOTE — Progress Notes (Signed)
Received call from Zigmund Daniel, RN at Dr. Nicole Cella office regarding patient's ASA regimen.  Patient is to continue their 81mg  daily ASA.  Called patient and spoke with patient's daughter, Lattie Haw to inform her to not stop ASA.  Lattie Haw verbalized understanding and stated "she does not stop the aspirin; I just wrote that on her instructions."

## 2017-05-08 ENCOUNTER — Ambulatory Visit (HOSPITAL_COMMUNITY)
Admission: RE | Admit: 2017-05-08 | Discharge: 2017-05-08 | Disposition: A | Discharge status: Home / Self Care | Payer: Medicare Other | Source: Ambulatory Visit | Attending: Internal Medicine | Admitting: Internal Medicine

## 2017-05-08 DIAGNOSIS — E1151 Type 2 diabetes mellitus with diabetic peripheral angiopathy without gangrene: Secondary | ICD-10-CM | POA: Insufficient documentation

## 2017-05-08 DIAGNOSIS — Z7984 Long term (current) use of oral hypoglycemic drugs: Secondary | ICD-10-CM | POA: Insufficient documentation

## 2017-05-08 DIAGNOSIS — K219 Gastro-esophageal reflux disease without esophagitis: Secondary | ICD-10-CM | POA: Insufficient documentation

## 2017-05-08 DIAGNOSIS — C9001 Multiple myeloma in remission: Secondary | ICD-10-CM | POA: Insufficient documentation

## 2017-05-08 DIAGNOSIS — R9439 Abnormal result of other cardiovascular function study: Secondary | ICD-10-CM | POA: Insufficient documentation

## 2017-05-08 DIAGNOSIS — Z8249 Family history of ischemic heart disease and other diseases of the circulatory system: Secondary | ICD-10-CM | POA: Insufficient documentation

## 2017-05-08 DIAGNOSIS — I110 Hypertensive heart disease with heart failure: Secondary | ICD-10-CM | POA: Insufficient documentation

## 2017-05-08 DIAGNOSIS — Z01818 Encounter for other preprocedural examination: Secondary | ICD-10-CM | POA: Insufficient documentation

## 2017-05-08 DIAGNOSIS — J449 Chronic obstructive pulmonary disease, unspecified: Secondary | ICD-10-CM | POA: Diagnosis not present

## 2017-05-08 DIAGNOSIS — Z79899 Other long term (current) drug therapy: Secondary | ICD-10-CM | POA: Diagnosis not present

## 2017-05-08 DIAGNOSIS — Z01812 Encounter for preprocedural laboratory examination: Secondary | ICD-10-CM | POA: Diagnosis not present

## 2017-05-08 DIAGNOSIS — Z87891 Personal history of nicotine dependence: Secondary | ICD-10-CM | POA: Insufficient documentation

## 2017-05-08 DIAGNOSIS — Z01810 Encounter for preprocedural cardiovascular examination: Secondary | ICD-10-CM | POA: Diagnosis not present

## 2017-05-08 DIAGNOSIS — I739 Peripheral vascular disease, unspecified: Secondary | ICD-10-CM | POA: Diagnosis not present

## 2017-05-08 DIAGNOSIS — I509 Heart failure, unspecified: Secondary | ICD-10-CM | POA: Diagnosis not present

## 2017-05-08 DIAGNOSIS — Z7951 Long term (current) use of inhaled steroids: Secondary | ICD-10-CM | POA: Insufficient documentation

## 2017-05-08 DIAGNOSIS — Z7952 Long term (current) use of systemic steroids: Secondary | ICD-10-CM | POA: Diagnosis not present

## 2017-05-08 DIAGNOSIS — Z0183 Encounter for blood typing: Secondary | ICD-10-CM | POA: Insufficient documentation

## 2017-05-08 DIAGNOSIS — Z7982 Long term (current) use of aspirin: Secondary | ICD-10-CM | POA: Insufficient documentation

## 2017-05-08 DIAGNOSIS — Z9981 Dependence on supplemental oxygen: Secondary | ICD-10-CM | POA: Insufficient documentation

## 2017-05-08 LAB — HEMOGLOBIN A1C
Hgb A1c MFr Bld: 5.9 % — ABNORMAL HIGH (ref 4.8–5.6)
Mean Plasma Glucose: 123 mg/dL

## 2017-05-08 LAB — MYOCARDIAL PERFUSION IMAGING
CHL CUP NUCLEAR SDS: 3
CHL CUP NUCLEAR SRS: 0
CHL CUP NUCLEAR SSS: 3
CSEPPHR: 81 {beats}/min
LV dias vol: 64 mL (ref 46–106)
LV sys vol: 15 mL
NUC STRESS TID: 1.17
Rest HR: 63 {beats}/min

## 2017-05-08 MED ORDER — TECHNETIUM TC 99M TETROFOSMIN IV KIT
10.8000 | PACK | Freq: Once | INTRAVENOUS | Status: AC | PRN
  Administered 2017-05-08: 10.8 via INTRAVENOUS
  Filled 2017-05-08: qty 11

## 2017-05-08 MED ORDER — TECHNETIUM TC 99M TETROFOSMIN IV KIT
30.2000 | PACK | Freq: Once | INTRAVENOUS | Status: AC | PRN
  Administered 2017-05-08: 30.2 via INTRAVENOUS
  Filled 2017-05-08: qty 31

## 2017-05-08 MED ORDER — REGADENOSON 0.4 MG/5ML IV SOLN
0.4000 mg | Freq: Once | INTRAVENOUS | Status: AC
  Administered 2017-05-08: 0.4 mg via INTRAVENOUS

## 2017-05-09 NOTE — Progress Notes (Signed)
Anesthesia Chart Review: Patient is a 82 year old female scheduled for right axillary bifemoral artery bypass on 05/20/2017 by Dr. [name].  History includes former smoker (quit '08), hypertension, COPD on home O2, GERD, IgG multiple myeloma stage IIIB (clinical remission on Revlemid), anemia, CHF, PAD, diabetes mellitus type 2, kyphoplasty, Port-a-cath (right IJ) 10/15/12.  - PCP is Dr. Judd Lien. - She was referred to cardiologist Dr. Lyman Bishop on 05/06/17 for a preoperative evaluation. He wrote, "I would recommend a Lexiscan Myoview. If this is a low risk study then she could likely proceed safely with a surgery at an acceptable risk." Stress test was low risk. - Oncologist is Dr. Charma Igo office ([facility] in [place]; see Care Everywhere). Last visit 05/05/17.  Meds include Xanax, aspirin 81 mg, Lipitor, Alphagan ophthalmic, Cosopt ophthalmic, Flonase, Lasix, Norco, Duoneb, Revlimid, Lumigan, metformin, KCl, prednisone.  BP (!) 118/54   Pulse (!) 59   Temp 36.4 C   Resp 18   Ht 4' 7" (1.397 m)   Wt 119 lb (54 kg)   SpO2 100%   BMI 27.66 kg/m   EKG 05/06/17: NSR with sinus arrhythmia, LAD, right bundle branch block.  Nuclear stress test 05/08/17:   The left ventricular ejection fraction is hyperdynamic (>65%).  Nuclear stress EF: 77%.  There was no ST segment deviation noted during stress.  No T wave inversion was noted during stress.  Defect 1: There is a small defect of mild severity present in the apical lateral location.  This is a low risk study. Low risk stress nuclear study with a tiny fixed apicolateral artifact, otherwise normal perfusion. Normal left ventricular regional and global systolic function.  CXR 09/17/16: IMPRESSION: No active cardiopulmonary disease. Cardiomegaly again noted. Stable right Port-A-Cath position.  Preoperative labs noted. Cr 1.29, glucose 166. H/H 9.0/29.7 (previously H/H  9.5/28.0 on 03/24/17,  9.6/29.4 on 01/15/17, 11.9/35.3 on 10/17/16). PLT 195. PT/PTT WNL. A1c 5.9. T&S done. She also had her Port flushed and labs drawn at [facility]  on 05/05/17 although results not seen in [place]. H/H there on 04/25/17 were 8.6/27.9. I will order Prepare PRBC 2 Units to have available, but defer decision for transfusion to surgeon and/or anesthesiologist. (Staff message sent to Dr. Scot Dock and V[facility] RNs Arbie Cookey and Zigmund Daniel regarding H/H. Defer any additional orders to Dr. Scot Dock.)  If no acute changes then I anticipate that she can proceed as planned.  George Hugh [facility] Short Stay Center/Anesthesiology Phone [phone] 05/09/2017 3:51 PM

## 2017-05-10 DIAGNOSIS — J449 Chronic obstructive pulmonary disease, unspecified: Secondary | ICD-10-CM | POA: Diagnosis not present

## 2017-05-15 ENCOUNTER — Telehealth: Payer: Self-pay | Admitting: Internal Medicine

## 2017-05-15 NOTE — Telephone Encounter (Signed)
Pt's dtr calling to get results of stress test done 05-08-17-pls call [phone]

## 2017-05-15 NOTE — Telephone Encounter (Signed)
LM for patient to return call for stress test results Daughter is not on DPR/there is no DPR to speak with (dgtr) about medical care/records   Notes recorded by Pixie Casino, MD on 05/09/2017 at 10:55 AM EDT Low risk stress test -no reversible ischemia.  Dr. Magdalen Spatz daughter's # listed and she answered and is with patient. Spoke with patient and provided results. Stress test results routed to Dr. Rosalia Hammers - patient has surgery 8/7 with this MD

## 2017-05-19 ENCOUNTER — Other Ambulatory Visit: Payer: Self-pay

## 2017-05-20 ENCOUNTER — Encounter (HOSPITAL_COMMUNITY): Payer: Self-pay | Admitting: *Deleted

## 2017-05-20 ENCOUNTER — Inpatient Hospital Stay (HOSPITAL_COMMUNITY): Payer: Medicare Other | Admitting: Vascular Surgery

## 2017-05-20 ENCOUNTER — Encounter (HOSPITAL_COMMUNITY)
Admission: RE | Disposition: A | Discharge status: Hospice | Payer: Self-pay | Source: Ambulatory Visit | Attending: Vascular Surgery

## 2017-05-20 ENCOUNTER — Inpatient Hospital Stay (HOSPITAL_COMMUNITY)
Admission: RE | Admit: 2017-05-20 | Discharge: 2017-06-10 | DRG: 252 | Disposition: A | Discharge status: Hospice | Payer: Medicare Other | Source: Ambulatory Visit | Attending: Internal Medicine | Admitting: Internal Medicine

## 2017-05-20 DIAGNOSIS — I959 Hypotension, unspecified: Secondary | ICD-10-CM

## 2017-05-20 DIAGNOSIS — C9000 Multiple myeloma not having achieved remission: Secondary | ICD-10-CM | POA: Diagnosis present

## 2017-05-20 DIAGNOSIS — H3530 Unspecified macular degeneration: Secondary | ICD-10-CM | POA: Diagnosis present

## 2017-05-20 DIAGNOSIS — J449 Chronic obstructive pulmonary disease, unspecified: Secondary | ICD-10-CM | POA: Diagnosis present

## 2017-05-20 DIAGNOSIS — E872 Acidosis: Secondary | ICD-10-CM | POA: Diagnosis not present

## 2017-05-20 DIAGNOSIS — J189 Pneumonia, unspecified organism: Secondary | ICD-10-CM

## 2017-05-20 DIAGNOSIS — Z515 Encounter for palliative care: Secondary | ICD-10-CM

## 2017-05-20 DIAGNOSIS — J9811 Atelectasis: Secondary | ICD-10-CM | POA: Diagnosis not present

## 2017-05-20 DIAGNOSIS — Z79899 Other long term (current) drug therapy: Secondary | ICD-10-CM

## 2017-05-20 DIAGNOSIS — E874 Mixed disorder of acid-base balance: Secondary | ICD-10-CM | POA: Diagnosis not present

## 2017-05-20 DIAGNOSIS — L89152 Pressure ulcer of sacral region, stage 2: Secondary | ICD-10-CM | POA: Diagnosis present

## 2017-05-20 DIAGNOSIS — J9690 Respiratory failure, unspecified, unspecified whether with hypoxia or hypercapnia: Secondary | ICD-10-CM | POA: Diagnosis not present

## 2017-05-20 DIAGNOSIS — J9601 Acute respiratory failure with hypoxia: Secondary | ICD-10-CM

## 2017-05-20 DIAGNOSIS — J150 Pneumonia due to Klebsiella pneumoniae: Secondary | ICD-10-CM | POA: Diagnosis not present

## 2017-05-20 DIAGNOSIS — L8990 Pressure ulcer of unspecified site, unspecified stage: Secondary | ICD-10-CM | POA: Insufficient documentation

## 2017-05-20 DIAGNOSIS — Z7952 Long term (current) use of systemic steroids: Secondary | ICD-10-CM

## 2017-05-20 DIAGNOSIS — Z7951 Long term (current) use of inhaled steroids: Secondary | ICD-10-CM

## 2017-05-20 DIAGNOSIS — K567 Ileus, unspecified: Secondary | ICD-10-CM | POA: Diagnosis not present

## 2017-05-20 DIAGNOSIS — J156 Pneumonia due to other aerobic Gram-negative bacteria: Secondary | ICD-10-CM | POA: Diagnosis not present

## 2017-05-20 DIAGNOSIS — I700 Atherosclerosis of aorta: Secondary | ICD-10-CM | POA: Diagnosis present

## 2017-05-20 DIAGNOSIS — Z9981 Dependence on supplemental oxygen: Secondary | ICD-10-CM

## 2017-05-20 DIAGNOSIS — Z452 Encounter for adjustment and management of vascular access device: Secondary | ICD-10-CM

## 2017-05-20 DIAGNOSIS — E1151 Type 2 diabetes mellitus with diabetic peripheral angiopathy without gangrene: Secondary | ICD-10-CM | POA: Diagnosis not present

## 2017-05-20 DIAGNOSIS — E870 Hyperosmolality and hypernatremia: Secondary | ICD-10-CM

## 2017-05-20 DIAGNOSIS — E11649 Type 2 diabetes mellitus with hypoglycemia without coma: Secondary | ICD-10-CM | POA: Diagnosis present

## 2017-05-20 DIAGNOSIS — Z8249 Family history of ischemic heart disease and other diseases of the circulatory system: Secondary | ICD-10-CM

## 2017-05-20 DIAGNOSIS — E1122 Type 2 diabetes mellitus with diabetic chronic kidney disease: Secondary | ICD-10-CM | POA: Diagnosis present

## 2017-05-20 DIAGNOSIS — R0600 Dyspnea, unspecified: Secondary | ICD-10-CM

## 2017-05-20 DIAGNOSIS — E1129 Type 2 diabetes mellitus with other diabetic kidney complication: Secondary | ICD-10-CM | POA: Diagnosis not present

## 2017-05-20 DIAGNOSIS — N179 Acute kidney failure, unspecified: Secondary | ICD-10-CM

## 2017-05-20 DIAGNOSIS — E1121 Type 2 diabetes mellitus with diabetic nephropathy: Secondary | ICD-10-CM

## 2017-05-20 DIAGNOSIS — I745 Embolism and thrombosis of iliac artery: Secondary | ICD-10-CM | POA: Diagnosis not present

## 2017-05-20 DIAGNOSIS — I70238 Atherosclerosis of native arteries of right leg with ulceration of other part of lower right leg: Secondary | ICD-10-CM | POA: Diagnosis not present

## 2017-05-20 DIAGNOSIS — I70233 Atherosclerosis of native arteries of right leg with ulceration of ankle: Secondary | ICD-10-CM | POA: Diagnosis not present

## 2017-05-20 DIAGNOSIS — N183 Chronic kidney disease, stage 3 unspecified: Secondary | ICD-10-CM

## 2017-05-20 DIAGNOSIS — E46 Unspecified protein-calorie malnutrition: Secondary | ICD-10-CM | POA: Diagnosis not present

## 2017-05-20 DIAGNOSIS — J188 Other pneumonia, unspecified organism: Secondary | ICD-10-CM

## 2017-05-20 DIAGNOSIS — I130 Hypertensive heart and chronic kidney disease with heart failure and stage 1 through stage 4 chronic kidney disease, or unspecified chronic kidney disease: Secondary | ICD-10-CM | POA: Diagnosis not present

## 2017-05-20 DIAGNOSIS — E11622 Type 2 diabetes mellitus with other skin ulcer: Secondary | ICD-10-CM | POA: Diagnosis not present

## 2017-05-20 DIAGNOSIS — R570 Cardiogenic shock: Secondary | ICD-10-CM

## 2017-05-20 DIAGNOSIS — N170 Acute kidney failure with tubular necrosis: Secondary | ICD-10-CM | POA: Diagnosis not present

## 2017-05-20 DIAGNOSIS — I7409 Other arterial embolism and thrombosis of abdominal aorta: Secondary | ICD-10-CM | POA: Diagnosis not present

## 2017-05-20 DIAGNOSIS — Z66 Do not resuscitate: Secondary | ICD-10-CM | POA: Diagnosis not present

## 2017-05-20 DIAGNOSIS — J90 Pleural effusion, not elsewhere classified: Secondary | ICD-10-CM

## 2017-05-20 DIAGNOSIS — E871 Hypo-osmolality and hyponatremia: Secondary | ICD-10-CM | POA: Diagnosis not present

## 2017-05-20 DIAGNOSIS — I739 Peripheral vascular disease, unspecified: Secondary | ICD-10-CM | POA: Diagnosis not present

## 2017-05-20 DIAGNOSIS — R1084 Generalized abdominal pain: Secondary | ICD-10-CM | POA: Diagnosis not present

## 2017-05-20 DIAGNOSIS — Z978 Presence of other specified devices: Secondary | ICD-10-CM

## 2017-05-20 DIAGNOSIS — E43 Unspecified severe protein-calorie malnutrition: Secondary | ICD-10-CM

## 2017-05-20 DIAGNOSIS — D62 Acute posthemorrhagic anemia: Secondary | ICD-10-CM | POA: Diagnosis not present

## 2017-05-20 DIAGNOSIS — R6521 Severe sepsis with septic shock: Secondary | ICD-10-CM | POA: Diagnosis not present

## 2017-05-20 DIAGNOSIS — E2740 Unspecified adrenocortical insufficiency: Secondary | ICD-10-CM | POA: Diagnosis not present

## 2017-05-20 DIAGNOSIS — E273 Drug-induced adrenocortical insufficiency: Secondary | ICD-10-CM | POA: Diagnosis not present

## 2017-05-20 DIAGNOSIS — I272 Pulmonary hypertension, unspecified: Secondary | ICD-10-CM | POA: Diagnosis not present

## 2017-05-20 DIAGNOSIS — J440 Chronic obstructive pulmonary disease with acute lower respiratory infection: Secondary | ICD-10-CM | POA: Diagnosis not present

## 2017-05-20 DIAGNOSIS — Z7982 Long term (current) use of aspirin: Secondary | ICD-10-CM

## 2017-05-20 DIAGNOSIS — D61818 Other pancytopenia: Secondary | ICD-10-CM | POA: Diagnosis not present

## 2017-05-20 DIAGNOSIS — Z87891 Personal history of nicotine dependence: Secondary | ICD-10-CM

## 2017-05-20 DIAGNOSIS — Z888 Allergy status to other drugs, medicaments and biological substances status: Secondary | ICD-10-CM

## 2017-05-20 DIAGNOSIS — L97319 Non-pressure chronic ulcer of right ankle with unspecified severity: Secondary | ICD-10-CM | POA: Diagnosis not present

## 2017-05-20 DIAGNOSIS — Z7984 Long term (current) use of oral hypoglycemic drugs: Secondary | ICD-10-CM

## 2017-05-20 DIAGNOSIS — R0603 Acute respiratory distress: Secondary | ICD-10-CM | POA: Diagnosis not present

## 2017-05-20 DIAGNOSIS — I5033 Acute on chronic diastolic (congestive) heart failure: Secondary | ICD-10-CM | POA: Diagnosis not present

## 2017-05-20 DIAGNOSIS — J9620 Acute and chronic respiratory failure, unspecified whether with hypoxia or hypercapnia: Secondary | ICD-10-CM | POA: Diagnosis not present

## 2017-05-20 DIAGNOSIS — R410 Disorientation, unspecified: Secondary | ICD-10-CM | POA: Diagnosis not present

## 2017-05-20 DIAGNOSIS — R400 Somnolence: Secondary | ICD-10-CM | POA: Diagnosis not present

## 2017-05-20 DIAGNOSIS — R140 Abdominal distension (gaseous): Secondary | ICD-10-CM

## 2017-05-20 DIAGNOSIS — A419 Sepsis, unspecified organism: Secondary | ICD-10-CM | POA: Diagnosis not present

## 2017-05-20 DIAGNOSIS — I5032 Chronic diastolic (congestive) heart failure: Secondary | ICD-10-CM | POA: Diagnosis not present

## 2017-05-20 DIAGNOSIS — R0602 Shortness of breath: Secondary | ICD-10-CM

## 2017-05-20 DIAGNOSIS — I70245 Atherosclerosis of native arteries of left leg with ulceration of other part of foot: Secondary | ICD-10-CM | POA: Diagnosis not present

## 2017-05-20 DIAGNOSIS — D72828 Other elevated white blood cell count: Secondary | ICD-10-CM | POA: Diagnosis not present

## 2017-05-20 DIAGNOSIS — I509 Heart failure, unspecified: Secondary | ICD-10-CM | POA: Diagnosis not present

## 2017-05-20 DIAGNOSIS — I2510 Atherosclerotic heart disease of native coronary artery without angina pectoris: Secondary | ICD-10-CM | POA: Diagnosis present

## 2017-05-20 DIAGNOSIS — J9600 Acute respiratory failure, unspecified whether with hypoxia or hypercapnia: Secondary | ICD-10-CM

## 2017-05-20 DIAGNOSIS — I5040 Unspecified combined systolic (congestive) and diastolic (congestive) heart failure: Secondary | ICD-10-CM | POA: Diagnosis not present

## 2017-05-20 DIAGNOSIS — Z6825 Body mass index (BMI) 25.0-25.9, adult: Secondary | ICD-10-CM

## 2017-05-20 DIAGNOSIS — I9589 Other hypotension: Secondary | ICD-10-CM | POA: Diagnosis not present

## 2017-05-20 DIAGNOSIS — I4891 Unspecified atrial fibrillation: Secondary | ICD-10-CM | POA: Diagnosis not present

## 2017-05-20 DIAGNOSIS — K219 Gastro-esophageal reflux disease without esophagitis: Secondary | ICD-10-CM | POA: Diagnosis present

## 2017-05-20 DIAGNOSIS — E1165 Type 2 diabetes mellitus with hyperglycemia: Secondary | ICD-10-CM | POA: Diagnosis present

## 2017-05-20 DIAGNOSIS — E876 Hypokalemia: Secondary | ICD-10-CM | POA: Diagnosis not present

## 2017-05-20 DIAGNOSIS — Z4682 Encounter for fitting and adjustment of non-vascular catheter: Secondary | ICD-10-CM | POA: Diagnosis not present

## 2017-05-20 DIAGNOSIS — J9621 Acute and chronic respiratory failure with hypoxia: Secondary | ICD-10-CM | POA: Diagnosis not present

## 2017-05-20 DIAGNOSIS — L89302 Pressure ulcer of unspecified buttock, stage 2: Secondary | ICD-10-CM | POA: Diagnosis not present

## 2017-05-20 DIAGNOSIS — J690 Pneumonitis due to inhalation of food and vomit: Secondary | ICD-10-CM | POA: Diagnosis not present

## 2017-05-20 DIAGNOSIS — I340 Nonrheumatic mitral (valve) insufficiency: Secondary | ICD-10-CM | POA: Diagnosis not present

## 2017-05-20 HISTORY — PX: AXILLARY-FEMORAL BYPASS GRAFT: SHX894

## 2017-05-20 LAB — CBC
HCT: 24.1 % — ABNORMAL LOW (ref 36.0–46.0)
Hemoglobin: 7.5 g/dL — ABNORMAL LOW (ref 12.0–15.0)
MCH: 28 pg (ref 26.0–34.0)
MCHC: 31.1 g/dL (ref 30.0–36.0)
MCV: 89.9 fL (ref 78.0–100.0)
Platelets: 136 10*3/uL — ABNORMAL LOW (ref 150–400)
RBC: 2.68 MIL/uL — ABNORMAL LOW (ref 3.87–5.11)
RDW: 16.6 % — ABNORMAL HIGH (ref 11.5–15.5)
WBC: 4.8 10*3/uL (ref 4.0–10.5)

## 2017-05-20 LAB — GLUCOSE, CAPILLARY
GLUCOSE-CAPILLARY: 158 mg/dL — AB (ref 65–99)
GLUCOSE-CAPILLARY: 101 mg/dL — AB (ref 65–99)
Glucose-Capillary: 148 mg/dL — ABNORMAL HIGH (ref 65–99)

## 2017-05-20 LAB — CREATININE, SERUM
CREATININE: 1.3 mg/dL — AB (ref 0.44–1.00)
GFR, EST AFRICAN AMERICAN: 44 mL/min — AB (ref >60)
GFR, EST NON AFRICAN AMERICAN: 38 mL/min — AB (ref >60)

## 2017-05-20 LAB — PREPARE RBC (CROSSMATCH)

## 2017-05-20 SURGERY — CREATION, BYPASS, ARTERIAL, AXILLARY TO BILATERAL FEMORAL, USING GRAFT
Anesthesia: General | Site: Chest | Laterality: Right

## 2017-05-20 MED ORDER — SODIUM CHLORIDE 0.9 % IV SOLN
INTRAVENOUS | Status: DC | PRN
  Administered 2017-05-20: 500 mL

## 2017-05-20 MED ORDER — PROMETHAZINE HCL 25 MG/ML IJ SOLN: 6.2500 mg | INTRAMUSCULAR | Status: DC | PRN

## 2017-05-20 MED ORDER — LIDOCAINE 2% (20 MG/ML) 5 ML SYRINGE
INTRAMUSCULAR | Status: AC
  Filled 2017-05-20: qty 5

## 2017-05-20 MED ORDER — DEXTROSE 5 % IV SOLN
INTRAVENOUS | Status: AC
  Filled 2017-05-20: qty 1.5

## 2017-05-20 MED ORDER — LIDOCAINE 2% (20 MG/ML) 5 ML SYRINGE
INTRAMUSCULAR | Status: DC | PRN
  Administered 2017-05-20: 60 mg via INTRAVENOUS

## 2017-05-20 MED ORDER — LACTATED RINGERS IV SOLN
INTRAVENOUS | Status: DC | PRN
  Administered 2017-05-20 (×2): via INTRAVENOUS

## 2017-05-20 MED ORDER — PROTAMINE SULFATE 10 MG/ML IV SOLN
INTRAVENOUS | Status: AC
  Filled 2017-05-20: qty 5

## 2017-05-20 MED ORDER — HYDROMORPHONE HCL 1 MG/ML IJ SOLN
INTRAMUSCULAR | Status: AC
  Filled 2017-05-20: qty 1

## 2017-05-20 MED ORDER — SUGAMMADEX SODIUM 200 MG/2ML IV SOLN
INTRAVENOUS | Status: DC | PRN
  Administered 2017-05-20: 110 mg via INTRAVENOUS

## 2017-05-20 MED ORDER — HYDROCODONE-ACETAMINOPHEN 10-325 MG PO TABS
1.0000 | ORAL_TABLET | ORAL | Status: DC | PRN
  Administered 2017-05-20: 1 via ORAL
  Administered 2017-05-21 – 2017-05-22 (×7): 2 via ORAL
  Administered 2017-05-23: 1 via ORAL
  Filled 2017-05-20 (×3): qty 2
  Filled 2017-05-20: qty 1
  Filled 2017-05-20 (×5): qty 2

## 2017-05-20 MED ORDER — LABETALOL HCL 5 MG/ML IV SOLN
10.0000 mg | INTRAVENOUS | Status: DC | PRN
  Filled 2017-05-20: qty 4

## 2017-05-20 MED ORDER — CALCIUM CARBONATE-VITAMIN D 500-200 MG-UNIT PO TABS
1.0000 | ORAL_TABLET | Freq: Two times a day (BID) | ORAL | Status: DC
  Administered 2017-05-20 – 2017-05-23 (×6): 1 via ORAL
  Filled 2017-05-20 (×7): qty 1

## 2017-05-20 MED ORDER — PHENYLEPHRINE 40 MCG/ML (10ML) SYRINGE FOR IV PUSH (FOR BLOOD PRESSURE SUPPORT)
PREFILLED_SYRINGE | INTRAVENOUS | Status: AC
  Filled 2017-05-20: qty 10

## 2017-05-20 MED ORDER — HYDRALAZINE HCL 20 MG/ML IJ SOLN: 5.0000 mg | INTRAMUSCULAR | Status: DC | PRN

## 2017-05-20 MED ORDER — POTASSIUM CHLORIDE CRYS ER 20 MEQ PO TBCR
20.0000 meq | EXTENDED_RELEASE_TABLET | Freq: Two times a day (BID) | ORAL | Status: DC
  Administered 2017-05-20 – 2017-05-23 (×6): 20 meq via ORAL
  Filled 2017-05-20 (×7): qty 1

## 2017-05-20 MED ORDER — SODIUM CHLORIDE 0.9 % IV SOLN: INTRAVENOUS | Status: DC

## 2017-05-20 MED ORDER — POLYETHYLENE GLYCOL 3350 17 GM/SCOOP PO POWD: 1.0000 | Freq: Every day | ORAL | Status: DC | PRN

## 2017-05-20 MED ORDER — METOPROLOL TARTRATE 5 MG/5ML IV SOLN
2.0000 mg | INTRAVENOUS | Status: AC | PRN
  Administered 2017-05-24: 2.5 mg via INTRAVENOUS
  Administered 2017-05-24: 5 mg via INTRAVENOUS
  Filled 2017-05-20: qty 5

## 2017-05-20 MED ORDER — LACTATED RINGERS IV SOLN
INTRAVENOUS | Status: DC | PRN
  Administered 2017-05-20: 10:00:00 via INTRAVENOUS

## 2017-05-20 MED ORDER — FLUTICASONE PROPIONATE 50 MCG/ACT NA SUSP
2.0000 | Freq: Every day | NASAL | Status: DC | PRN
  Filled 2017-05-20: qty 16

## 2017-05-20 MED ORDER — BISACODYL 10 MG RE SUPP
10.0000 mg | Freq: Every day | RECTAL | Status: DC | PRN
  Administered 2017-05-23: 10 mg via RECTAL
  Filled 2017-05-20: qty 1

## 2017-05-20 MED ORDER — SODIUM CHLORIDE 0.9 % IV SOLN
500.0000 mL | Freq: Once | INTRAVENOUS | Status: AC | PRN
  Administered 2017-05-20: 500 mL via INTRAVENOUS

## 2017-05-20 MED ORDER — ASPIRIN EC 81 MG PO TBEC
81.0000 mg | DELAYED_RELEASE_TABLET | Freq: Every day | ORAL | Status: DC
  Administered 2017-05-20 – 2017-05-22 (×3): 81 mg via ORAL
  Filled 2017-05-20 (×4): qty 1

## 2017-05-20 MED ORDER — ONDANSETRON HCL 4 MG/2ML IJ SOLN
INTRAMUSCULAR | Status: DC | PRN
  Administered 2017-05-20: 4 mg via INTRAVENOUS

## 2017-05-20 MED ORDER — DOCUSATE SODIUM 100 MG PO CAPS
100.0000 mg | ORAL_CAPSULE | Freq: Every day | ORAL | Status: DC
  Administered 2017-05-21 – 2017-05-22 (×2): 100 mg via ORAL
  Filled 2017-05-20 (×3): qty 1

## 2017-05-20 MED ORDER — FENTANYL CITRATE (PF) 250 MCG/5ML IJ SOLN
INTRAMUSCULAR | Status: AC
  Filled 2017-05-20: qty 5

## 2017-05-20 MED ORDER — SODIUM CHLORIDE 0.9 % IV SOLN
INTRAVENOUS | Status: DC
  Administered 2017-05-22 – 2017-05-23 (×3): via INTRAVENOUS

## 2017-05-20 MED ORDER — SUGAMMADEX SODIUM 200 MG/2ML IV SOLN
INTRAVENOUS | Status: AC
  Filled 2017-05-20: qty 2

## 2017-05-20 MED ORDER — DORZOLAMIDE HCL-TIMOLOL MAL 2-0.5 % OP SOLN
1.0000 [drp] | Freq: Two times a day (BID) | OPHTHALMIC | Status: DC
  Administered 2017-05-20 – 2017-06-10 (×41): 1 [drp] via OPHTHALMIC
  Filled 2017-05-20: qty 10

## 2017-05-20 MED ORDER — ATORVASTATIN CALCIUM 10 MG PO TABS
10.0000 mg | ORAL_TABLET | Freq: Every day | ORAL | Status: DC
  Administered 2017-05-20 – 2017-05-22 (×3): 10 mg via ORAL
  Filled 2017-05-20 (×4): qty 1

## 2017-05-20 MED ORDER — MAGNESIUM SULFATE 2 GM/50ML IV SOLN
2.0000 g | Freq: Every day | INTRAVENOUS | Status: DC | PRN
  Filled 2017-05-20: qty 50

## 2017-05-20 MED ORDER — ALUM & MAG HYDROXIDE-SIMETH 200-200-20 MG/5ML PO SUSP: 15.0000 mL | ORAL | Status: DC | PRN

## 2017-05-20 MED ORDER — ONDANSETRON HCL 4 MG/2ML IJ SOLN
4.0000 mg | Freq: Four times a day (QID) | INTRAMUSCULAR | Status: DC | PRN
  Administered 2017-05-20 – 2017-05-23 (×3): 4 mg via INTRAVENOUS
  Filled 2017-05-20 (×4): qty 2

## 2017-05-20 MED ORDER — PHENYLEPHRINE HCL 10 MG/ML IJ SOLN
INTRAMUSCULAR | Status: DC | PRN
  Administered 2017-05-20: 20 ug/min via INTRAVENOUS

## 2017-05-20 MED ORDER — PROPOFOL 10 MG/ML IV BOLUS
INTRAVENOUS | Status: DC | PRN
  Administered 2017-05-20: 40 mg via INTRAVENOUS
  Administered 2017-05-20: 100 mg via INTRAVENOUS

## 2017-05-20 MED ORDER — ROCURONIUM BROMIDE 10 MG/ML (PF) SYRINGE
PREFILLED_SYRINGE | INTRAVENOUS | Status: DC | PRN
  Administered 2017-05-20 (×2): 10 mg via INTRAVENOUS
  Administered 2017-05-20: 50 mg via INTRAVENOUS

## 2017-05-20 MED ORDER — LATANOPROST 0.005 % OP SOLN
1.0000 [drp] | Freq: Every day | OPHTHALMIC | Status: DC
  Administered 2017-05-20 – 2017-06-09 (×20): 1 [drp] via OPHTHALMIC
  Filled 2017-05-20: qty 2.5

## 2017-05-20 MED ORDER — IPRATROPIUM-ALBUTEROL 0.5-2.5 (3) MG/3ML IN SOLN
3.0000 mL | Freq: Two times a day (BID) | RESPIRATORY_TRACT | Status: DC
  Administered 2017-05-20 – 2017-05-25 (×10): 3 mL via RESPIRATORY_TRACT
  Filled 2017-05-20 (×11): qty 3

## 2017-05-20 MED ORDER — PROPOFOL 10 MG/ML IV BOLUS
INTRAVENOUS | Status: AC
  Filled 2017-05-20: qty 20

## 2017-05-20 MED ORDER — HEPARIN SODIUM (PORCINE) 1000 UNIT/ML IJ SOLN
INTRAMUSCULAR | Status: DC | PRN
  Administered 2017-05-20: 5500 [IU] via INTRAVENOUS

## 2017-05-20 MED ORDER — ACETAMINOPHEN 325 MG PO TABS: 325.0000 mg | ORAL_TABLET | ORAL | Status: DC | PRN

## 2017-05-20 MED ORDER — HEPARIN SODIUM (PORCINE) 1000 UNIT/ML IJ SOLN
INTRAMUSCULAR | Status: AC
  Filled 2017-05-20: qty 1

## 2017-05-20 MED ORDER — PREDNISONE 10 MG PO TABS
10.0000 mg | ORAL_TABLET | Freq: Every day | ORAL | Status: DC
  Administered 2017-05-21 – 2017-05-23 (×3): 10 mg via ORAL
  Filled 2017-05-20 (×3): qty 1

## 2017-05-20 MED ORDER — GUAIFENESIN-DM 100-10 MG/5ML PO SYRP
15.0000 mL | ORAL_SOLUTION | ORAL | Status: DC | PRN
  Administered 2017-05-23 (×2): 15 mL via ORAL
  Filled 2017-05-20 (×2): qty 15

## 2017-05-20 MED ORDER — FENTANYL CITRATE (PF) 100 MCG/2ML IJ SOLN
INTRAMUSCULAR | Status: DC | PRN
  Administered 2017-05-20: 50 ug via INTRAVENOUS
  Administered 2017-05-20: 100 ug via INTRAVENOUS
  Administered 2017-05-20 (×3): 50 ug via INTRAVENOUS

## 2017-05-20 MED ORDER — CHLORHEXIDINE GLUCONATE 4 % EX LIQD: 60.0000 mL | Freq: Once | CUTANEOUS | Status: DC

## 2017-05-20 MED ORDER — SILVER SULFADIAZINE 1 % EX CREA
1.0000 "application " | TOPICAL_CREAM | Freq: Every day | CUTANEOUS | Status: DC
  Administered 2017-05-22 – 2017-06-03 (×13): 1 via TOPICAL
  Filled 2017-05-20 (×3): qty 85

## 2017-05-20 MED ORDER — PHENYLEPHRINE 40 MCG/ML (10ML) SYRINGE FOR IV PUSH (FOR BLOOD PRESSURE SUPPORT)
PREFILLED_SYRINGE | INTRAVENOUS | Status: DC | PRN
  Administered 2017-05-20: 80 ug via INTRAVENOUS

## 2017-05-20 MED ORDER — PHENOL 1.4 % MT LIQD: 1.0000 | OROMUCOSAL | Status: DC | PRN

## 2017-05-20 MED ORDER — ONDANSETRON HCL 4 MG/2ML IJ SOLN
INTRAMUSCULAR | Status: AC
  Filled 2017-05-20: qty 2

## 2017-05-20 MED ORDER — INSULIN ASPART 100 UNIT/ML ~~LOC~~ SOLN
0.0000 [IU] | Freq: Three times a day (TID) | SUBCUTANEOUS | Status: DC
  Administered 2017-05-21 (×2): 2 [IU] via SUBCUTANEOUS
  Administered 2017-05-21 – 2017-05-23 (×4): 1 [IU] via SUBCUTANEOUS
  Administered 2017-05-23 – 2017-05-24 (×2): 2 [IU] via SUBCUTANEOUS
  Administered 2017-05-24: 3 [IU] via SUBCUTANEOUS

## 2017-05-20 MED ORDER — DEXTROSE 5 % IV SOLN
1.5000 g | Freq: Two times a day (BID) | INTRAVENOUS | Status: AC
  Administered 2017-05-20 – 2017-05-21 (×2): 1.5 g via INTRAVENOUS
  Filled 2017-05-20 (×2): qty 1.5

## 2017-05-20 MED ORDER — BRIMONIDINE TARTRATE 0.2 % OP SOLN
1.0000 [drp] | Freq: Three times a day (TID) | OPHTHALMIC | Status: DC
  Administered 2017-05-20 – 2017-06-10 (×59): 1 [drp] via OPHTHALMIC
  Filled 2017-05-20 (×2): qty 5

## 2017-05-20 MED ORDER — HYDROMORPHONE HCL 1 MG/ML IJ SOLN
0.2500 mg | INTRAMUSCULAR | Status: DC | PRN
  Administered 2017-05-20 (×4): 0.5 mg via INTRAVENOUS

## 2017-05-20 MED ORDER — ACETAMINOPHEN 650 MG RE SUPP
325.0000 mg | RECTAL | Status: DC | PRN
  Administered 2017-05-25: 650 mg via RECTAL
  Filled 2017-05-20: qty 1

## 2017-05-20 MED ORDER — ENOXAPARIN SODIUM 40 MG/0.4ML ~~LOC~~ SOLN
40.0000 mg | SUBCUTANEOUS | Status: DC
  Administered 2017-05-21 – 2017-05-23 (×3): 40 mg via SUBCUTANEOUS
  Filled 2017-05-20 (×3): qty 0.4

## 2017-05-20 MED ORDER — 0.9 % SODIUM CHLORIDE (POUR BTL) OPTIME
TOPICAL | Status: DC | PRN
  Administered 2017-05-20: 2000 mL

## 2017-05-20 MED ORDER — LACTATED RINGERS IV SOLN
INTRAVENOUS | Status: DC
  Administered 2017-05-20: 09:00:00 via INTRAVENOUS

## 2017-05-20 MED ORDER — LENALIDOMIDE 10 MG PO CAPS
10.0000 mg | ORAL_CAPSULE | ORAL | Status: DC
  Administered 2017-05-21 – 2017-05-23 (×2): 10 mg via ORAL
  Filled 2017-05-20 (×4): qty 1

## 2017-05-20 MED ORDER — MORPHINE SULFATE (PF) 2 MG/ML IV SOLN
2.0000 mg | INTRAVENOUS | Status: DC | PRN
  Administered 2017-05-20 (×2): 2 mg via INTRAVENOUS
  Filled 2017-05-20: qty 1
  Filled 2017-05-20: qty 2
  Filled 2017-05-20: qty 1

## 2017-05-20 MED ORDER — METFORMIN HCL 500 MG PO TABS
500.0000 mg | ORAL_TABLET | Freq: Every day | ORAL | Status: DC
  Administered 2017-05-21 – 2017-05-22 (×2): 500 mg via ORAL
  Filled 2017-05-20 (×2): qty 1

## 2017-05-20 MED ORDER — ALPRAZOLAM 0.5 MG PO TABS
1.0000 mg | ORAL_TABLET | Freq: Two times a day (BID) | ORAL | Status: DC | PRN
  Administered 2017-05-22: 1 mg via ORAL
  Filled 2017-05-20 (×2): qty 2

## 2017-05-20 MED ORDER — POLYETHYLENE GLYCOL 3350 17 G PO PACK
17.0000 g | PACK | Freq: Every day | ORAL | Status: DC | PRN
  Administered 2017-05-22: 17 g via ORAL
  Filled 2017-05-20: qty 1

## 2017-05-20 MED ORDER — HEMOSTATIC AGENTS (NO CHARGE) OPTIME
TOPICAL | Status: DC | PRN
  Administered 2017-05-20: 1 via TOPICAL

## 2017-05-20 MED ORDER — PROTAMINE SULFATE 10 MG/ML IV SOLN
INTRAVENOUS | Status: DC | PRN
  Administered 2017-05-20 (×3): 10 mg via INTRAVENOUS

## 2017-05-20 MED ORDER — POTASSIUM CHLORIDE CRYS ER 20 MEQ PO TBCR: 20.0000 meq | EXTENDED_RELEASE_TABLET | Freq: Every day | ORAL | Status: DC | PRN

## 2017-05-20 MED ORDER — PANTOPRAZOLE SODIUM 40 MG PO TBEC
40.0000 mg | DELAYED_RELEASE_TABLET | Freq: Every day | ORAL | Status: DC
  Administered 2017-05-20 – 2017-05-22 (×3): 40 mg via ORAL
  Filled 2017-05-20 (×4): qty 1

## 2017-05-20 MED ORDER — DEXTROSE 5 % IV SOLN
1.5000 g | INTRAVENOUS | Status: AC
  Administered 2017-05-20: 1.5 g via INTRAVENOUS

## 2017-05-20 MED ORDER — FUROSEMIDE 40 MG PO TABS
40.0000 mg | ORAL_TABLET | Freq: Two times a day (BID) | ORAL | Status: DC
  Administered 2017-05-20 – 2017-05-22 (×4): 40 mg via ORAL
  Filled 2017-05-20 (×6): qty 1

## 2017-05-20 MED ORDER — ROCURONIUM BROMIDE 10 MG/ML (PF) SYRINGE
PREFILLED_SYRINGE | INTRAVENOUS | Status: AC
  Filled 2017-05-20: qty 5

## 2017-05-20 SURGICAL SUPPLY — 55 items
ATTRACTOMAT 16X20 MAGNETIC DRP (DRAPES) ×3 IMPLANT
CANISTER SUCT 3000ML PPV (MISCELLANEOUS) ×3 IMPLANT
CLIP VESOCCLUDE MED 24/CT (CLIP) ×3 IMPLANT
CLIP VESOCCLUDE SM WIDE 24/CT (CLIP) ×3 IMPLANT
COVER PROBE W GEL 5X96 (DRAPES) ×3 IMPLANT
DERMABOND ADVANCED (GAUZE/BANDAGES/DRESSINGS) ×4
DERMABOND ADVANCED .7 DNX12 (GAUZE/BANDAGES/DRESSINGS) ×2 IMPLANT
DRAIN SNY 10X20 3/4 PERF (WOUND CARE) IMPLANT
DRAPE INCISE IOBAN 66X45 STRL (DRAPES) ×3 IMPLANT
DRESSING PEEL AND PLC PRVNA 13 (GAUZE/BANDAGES/DRESSINGS) ×2 IMPLANT
DRSG PEEL AND PLACE PREVENA 13 (GAUZE/BANDAGES/DRESSINGS) ×6
ELECT REM PT RETURN 9FT ADLT (ELECTROSURGICAL) ×3
ELECTRODE REM PT RTRN 9FT ADLT (ELECTROSURGICAL) ×1 IMPLANT
EVACUATOR SILICONE 100CC (DRAIN) IMPLANT
GAUZE SPONGE 4X4 16PLY XRAY LF (GAUZE/BANDAGES/DRESSINGS) ×3 IMPLANT
GLOVE BIO SURGEON STRL SZ 6.5 (GLOVE) ×2 IMPLANT
GLOVE BIO SURGEON STRL SZ7 (GLOVE) ×3 IMPLANT
GLOVE BIO SURGEON STRL SZ7.5 (GLOVE) ×3 IMPLANT
GLOVE BIO SURGEONS STRL SZ 6.5 (GLOVE) ×1
GLOVE BIOGEL PI IND STRL 6.5 (GLOVE) ×3 IMPLANT
GLOVE BIOGEL PI IND STRL 7.0 (GLOVE) ×1 IMPLANT
GLOVE BIOGEL PI IND STRL 7.5 (GLOVE) ×1 IMPLANT
GLOVE BIOGEL PI IND STRL 8 (GLOVE) ×1 IMPLANT
GLOVE BIOGEL PI INDICATOR 6.5 (GLOVE) ×6
GLOVE BIOGEL PI INDICATOR 7.0 (GLOVE) ×2
GLOVE BIOGEL PI INDICATOR 7.5 (GLOVE) ×2
GLOVE BIOGEL PI INDICATOR 8 (GLOVE) ×2
GLOVE ECLIPSE 6.5 STRL STRAW (GLOVE) ×6 IMPLANT
GOWN STRL REUS W/ TWL LRG LVL3 (GOWN DISPOSABLE) ×4 IMPLANT
GOWN STRL REUS W/TWL LRG LVL3 (GOWN DISPOSABLE) ×8
GRAFT CV 60X8STRG TUBE KNTD (Vascular Products) ×1 IMPLANT
GRAFT HEMASHIELD 8MM (Vascular Products) ×4 IMPLANT
GRAFT VASC STRG 30X8KNIT (Vascular Products) ×1 IMPLANT
HEMOSTAT SPONGE AVITENE ULTRA (HEMOSTASIS) ×3 IMPLANT
INSERT FOGARTY SM (MISCELLANEOUS) ×6 IMPLANT
KIT BASIN OR (CUSTOM PROCEDURE TRAY) ×3 IMPLANT
KIT DRSG PREVENA PLUS 7DAY 125 (MISCELLANEOUS) ×6 IMPLANT
KIT ROOM TURNOVER OR (KITS) ×3 IMPLANT
LOOP VESSEL MAXI BLUE (MISCELLANEOUS) ×3 IMPLANT
NS IRRIG 1000ML POUR BTL (IV SOLUTION) ×6 IMPLANT
PACK PERIPHERAL VASCULAR (CUSTOM PROCEDURE TRAY) ×3 IMPLANT
PAD ARMBOARD 7.5X6 YLW CONV (MISCELLANEOUS) ×6 IMPLANT
SPONGE SURGIFOAM ABS GEL 100 (HEMOSTASIS) IMPLANT
STAPLER VISISTAT 35W (STAPLE) ×3 IMPLANT
SUT PROLENE 5 0 C 1 24 (SUTURE) ×18 IMPLANT
SUT PROLENE 6 0 BV (SUTURE) ×6 IMPLANT
SUT SILK 2 0 FS (SUTURE) ×6 IMPLANT
SUT SILK 3 0 (SUTURE)
SUT SILK 3-0 18XBRD TIE 12 (SUTURE) IMPLANT
SUT VIC AB 2-0 CTB1 (SUTURE) ×9 IMPLANT
SUT VIC AB 3-0 SH 27 (SUTURE) ×6
SUT VIC AB 3-0 SH 27X BRD (SUTURE) ×3 IMPLANT
SUT VICRYL 4-0 PS2 18IN ABS (SUTURE) ×9 IMPLANT
TRAY FOLEY W/METER SILVER 16FR (SET/KITS/TRAYS/PACK) ×3 IMPLANT
WATER STERILE IRR 1000ML POUR (IV SOLUTION) ×3 IMPLANT

## 2017-05-20 NOTE — Anesthesia Procedure Notes (Addendum)
Arterial Line Insertion Start/End8/04/2017 9:40 AM Performed by: Vanetta Mulders, CRNA  Patient location: Pre-op. Preanesthetic checklist: patient identified, IV checked, site marked, risks and benefits discussed, surgical consent, monitors and equipment checked, pre-op evaluation, timeout performed and anesthesia consent Lidocaine 1% used for infiltration Left, radial was placed Catheter size: 20 G Hand hygiene performed  and maximum sterile barriers used   Attempts: 1 Procedure performed without using ultrasound guided technique. Following insertion, dressing applied and Biopatch. Post procedure assessment: normal  Patient tolerated the procedure well with no immediate complications.

## 2017-05-20 NOTE — Op Note (Signed)
OPERATIVE NOTE   PROCEDURE: 1. Left common femoral artery exposure 2. Left femorofemoral anastomosis as part of Right axillobifemoral bypass  PRE-OPERATIVE DIAGNOSIS: right non-healing ulcer over right lateral malleolus  POST-OPERATIVE DIAGNOSIS: same as above   CO-SURGEON: Gae Gallop, MD; Adele Barthel, MD  ANESTHESIA: general  ESTIMATED BLOOD LOSS: see Dr. Nicole Cella note  FINDING(S): 1.  Left common femoral artery and distal external iliac artery posterior plaque: <50% of lumen 2.  Palpable left superficial femoral artery and profunda femoral artery at end of case  SPECIMEN(S):  none  INDICATIONS:   Brooke Keller is a 82 y.o. female who presents with non-healing ulcer over right lateral malleolus.  This patient also have multiple severe co-morbidities so a right axillobifemoral bypass was scheduled by Dr. Scot Dock.  The informed consent was obtained by Dr. Scot Dock.  See his Op Note for details..   DESCRIPTION: After obtaining full informed written consent, the patient was brought back to the operating room and placed supine upon the operating table.  The patient received IV antibiotics prior to induction.  A procedure time out was completed and the correct surgical site was verified.  After obtaining adequate anesthesia, the patient was prepped and draped in the standard fashion for: right axillobifemoral bypass.  A two surgeon technique was utilized to limited time under general anesthesia given his patient's multiple co-morbidities.  The bulk of this case will be dictated by Dr. Scot Dock.  I started by identifying and marking the location of bilateral common femoral artery.  Given the large pannus present, I felt that an oblique incision one finger width proximal to the groin crease was indicated in both groins.  I marked the incision in both groins.  I turned my attention to the left groin, when I made an oblique incision as described.  I dissected down to the common femoral  artery from the inguinal ligament down to the femoral bifurcation.  While the distal external iliac artery and proximal common femoral artery was heavily calcified, the majority of the common femoral artery appeared to be soft with a calcific posterior plaque.  I dissected out the profunda femoral artery and superficial femoral artery with vessel loops.    Meanwhile, Dr. Scot Dock had completed the right infraclavicular exposure.  I assisted him with his right groin exposure, which was similar to my exposure.  A 8 mm Dacron graft was placed just superficial to the fascia with a curvilinear tunneler from the right to left groin exposures.  Care was taken to avoid violating the fascia, given its floppy consistency.  The right end of this graft was clamped.  I determine the geometry to get the best lay for this femorofemoral graft.  I clamped the distal external iliac artery and placed the superficial femoral artery and profunda femoral artery under tension.  I made an arteriotomy in the left common femoral artery with a 11-blade.  I extended this proximally and distally.  I spatulated the left end of the femorofemoral graft to meet the geometry of this arteriotomy.  I sewed the graft to the left common femoral artery in an end to side configuration with a running stitch of 5-0 Prolene.  Prior to completing this anastomosis, I backbled the profunda femoral artery, superficial femoral artery and distal external iliac artery.  No thrombus was noted.  I also pulled up on the suture line with a nerve hook.  At this point, I clamped the femorofemoral graft just proximal to this new anastomosis.  At this point, Dr. Scot Dock had completed the right axillofemoral anastomoses.  He completed the anastomosis from the femorofemoral graft to the right axillofemoral graft.  See his Op Note for details.  All clamps were removed and there was a palpable pulse in the left common femoral artery, superficial femoral artery and profunda  femoral artery.  After giving Protamine and packing all incisions with Avitene, we waited a few minutes.  No more active bleeding was noted.  Points of ooze were controlled with electrocautery.    I repaired the left groin with a layer of 2-0 immediately superficial to the left femoral artery and femorofemoral graft anastomosis.  I then closed the subcutaneous tissue with a double layer of 2-0 Vicryl followed by a double layer of 3-0 Vicryl.  The skin was reapproximated with a running subcuticular of 4-0 Vicryl.  The skin was cleaned, dried, and reinforced with Dermabond.  Dr. Scot Dock placed Prevena incision vacuum dressings over both femoral exposure incision given this patient extreme obesity.   COMPLICATIONS: none  CONDITION: stable   Adele Barthel, MD, [facility] [place] Office: [phone] Pager: [phone]  05/20/2017, 12:37 PM

## 2017-05-20 NOTE — Op Note (Signed)
NAME: Brooke Keller    MRN: [id number] DOB: 1935-09-25    DATE OF OPERATION: 05/20/2017  PREOP DIAGNOSIS:    Critical limb ischemia with distal aortic occlusion and common iliac artery occlusions  POSTOP DIAGNOSIS:    Same  PROCEDURE:    RIGHT AXILLOBIFEMORAL BYPASS GRAFT   CO-SURGEON's: Judeth Cornfield. Scot Dock, MD, Adele Barthel, MD  ASSIST: Silva Bandy, [facility]  ANESTHESIA: Gen.   EBL: 200 cc  INDICATIONS:    Brooke Keller is a 82 y.o. female who presented with nonhealing wounds on both feet and threatened limb loss. Arteriogram showed bilateral common iliac artery occlusions and occlusion of the distal aorta. Her options were essentially bilateral AKA's if the wounds failed to heal versus attempted revascularization. She wished to pursue revascularization despite increased risk given her age and multiple medical comorbidities.  FINDINGS:    The common femoral arteries were patent bilaterally.  TECHNIQUE:    The patient was taken to the operating room and received a general anesthetic. The abdomen, right infraclavicular area, both groins were prepped and draped in usual sterile fashion. A 2 team approach was used given her risk and the advantage of getting the operation done as quickly as possible.   Exposure of the left groin, the left femoral anastomosis, and tunneling of the graft are dictated separately by Dr. Adele Barthel.  On the right side, a right infraclavicular incision was made and then dissection carried down to the clavipectoral fascia which was divided. Also divided partially the pectoralis minor muscle. The axillary artery was then identified in the depths of the wound and this was controlled with a vessel loop. The thyrocervical trunk was ligated and divided as this appeared to be the best spot for the anastomosis. Enough of the artery was exposed at this could be clamped proximally and distally.  Next an oblique incision was made in the right groin where the common  femoral artery had been marked by duplex. The dissection was carried down to the common femoral artery which was controlled with a vessel loop. The dissection was carried distally to the superficial femoral artery and deep femoral arteries which are also controlled. A tunnel was crated between the 2 and groin incisions in an inverted U fashion. An 8 mm Dacron graft was tunneled between the 2 incisions. Next a long tunneler was used to create a tunnel from the right groin anastomosis to the right infraclavicular anastomosis. The 8 mm graft was tunneled here. The patient was heparinized.  On the right side the axillary artery was Proximal distally and longitudinal arteriotomy was made on the inferior aspect of the artery. The graft was slightly spatulated and sewn end to side to the axillary artery using continuous 5-0 Prolene suture. Prior to completing this anastomosis, the artery was backbled and flushed appropriately and the anastomosis completed. The grafts and port appropriate length for anastomosis to the common femoral artery. The common femoral, superficial femoral, and deep femoral arteries were clamped proximally and distally and a longitudinal arteriotomy was made on the anterolateral aspect of the common femoral artery extending slightly onto the superficial femoral artery. The axillofemoral graft was cut to appropriate length, spatulated, and sewn end to side to the common femoral artery using continuous 5-0 Prolene suture. Prior to completing this anastomosis, the arteries were backbled and flushed appropriate length and the anastomosis completed.  The left femoral anastomosis had been completed as dictated separately. The femorofemoral graft was then brought over for anastomosis to the  axillofemoral graft. We were careful to position the graft so that her large pannus would not in any way kink the graft. The graft was clamped proximally and distally and a longitudinal graftotomy was made on the  medial aspect of the graft. The femorofemoral graft was then cut to appropriate length and sewn end to side to the axillofemoral graft using continuous 5-0 Prolene suture. Prior to completing this anastomosis, the arteries were backbled and flushed appropriately and the anastomosis completed. Flow was established in both legs which the patient tolerated from a Hemovac standpoint. There was good flow in both superficial femoral arteries which augmented with the graft open.   Hemostasis was obtained in the wounds. The infraclavicular incision on the right was closed with a deep layer of 3-0 Vicryl, a subcutaneous layer of 3-0 Vicryl and the skin closed with 4-0 Vicryl. Of note there was a port present here and we preserved this catheter as this did not interfere with the exposure. In the right groin 2 deep layers of 2-0 Vicryl were placed and the skin closed with 4-0 Vicryl. A Prolene a dressing was placed at the completion. Closure of the left groin as dictated by Dr. Bridgett Larsson.  The patient tolerated the procedure well and was transferred to recovery in stable condition. All needle and sponge counts were correct.  [name], MD, FACS [facility]  DATE OF DICTATION:   05/20/2017

## 2017-05-20 NOTE — Anesthesia Preprocedure Evaluation (Signed)
Anesthesia Evaluation  Patient identified by MRN, date of birth, ID band Patient awake    Reviewed: Allergy & Precautions, NPO status , Patient's Chart, lab work & pertinent test results  Airway Mallampati: I  TM Distance: >3 FB Neck ROM: Full    Dental no notable dental hx. (+) Edentulous Upper, Edentulous Lower, Dental Advisory Given   Pulmonary COPD, former smoker,    Pulmonary exam normal        Cardiovascular hypertension, +CHF  negative cardio ROS Normal cardiovascular exam     Neuro/Psych negative neurological ROS  negative psych ROS   GI/Hepatic negative GI ROS, Neg liver ROS,   Endo/Other  negative endocrine ROSdiabetes  Renal/GU negative Renal ROS  negative genitourinary   Musculoskeletal negative musculoskeletal ROS (+)   Abdominal   Peds negative pediatric ROS (+)  Hematology negative hematology ROS (+)   Anesthesia Other Findings   Reproductive/Obstetrics negative OB ROS                             Anesthesia Physical Anesthesia Plan  ASA: III  Anesthesia Plan: General   Post-op Pain Management:    Induction: Intravenous  PONV Risk Score and Plan: 3 and Ondansetron, Dexamethasone and Diphenhydramine  Airway Management Planned: Oral ETT  Additional Equipment: Arterial line  Intra-op Plan:   Post-operative Plan: Extubation in OR  Informed Consent: I have reviewed the patients History and Physical, chart, labs and discussed the procedure including the risks, benefits and alternatives for the proposed anesthesia with the patient or authorized representative who has indicated his/her understanding and acceptance.   Dental advisory given  Plan Discussed with: CRNA, Anesthesiologist and Surgeon  Anesthesia Plan Comments:         Anesthesia Quick Evaluation

## 2017-05-20 NOTE — Transfer of Care (Signed)
Immediate Anesthesia Transfer of Care Note  Patient: Brooke Keller  Procedure(s) Performed: Procedure(s): Right  AXILLA-BIFEMORAL Bypass Graft Application Of bilateral femoral provenas (Right)  Patient Location: PACU  Anesthesia Type:General  Level of Consciousness: awake, alert  and oriented  Airway & Oxygen Therapy: Patient Spontanous Breathing and Patient connected to nasal cannula oxygen  Post-op Assessment: Report given to RN, Post -op Vital signs reviewed and stable and Patient moving all extremities X 4  Post vital signs: Reviewed and stable  Last Vitals:  Vitals:   05/20/17 0835  BP: 126/62  Pulse: 66  Resp: 16  Temp: 36.8 C    Last Pain:  Vitals:   05/20/17 0835  TempSrc: Oral         Complications: No apparent anesthesia complications

## 2017-05-20 NOTE — Anesthesia Procedure Notes (Signed)
Procedure Name: Intubation Date/Time: 05/20/2017 10:24 AM Performed by: Garrison Columbus T Pre-anesthesia Checklist: Patient identified, Emergency Drugs available, Suction available and Patient being monitored Patient Re-evaluated:Patient Re-evaluated prior to induction Oxygen Delivery Method: Circle System Utilized Preoxygenation: Pre-oxygenation with 100% oxygen Induction Type: IV induction Ventilation: Mask ventilation without difficulty Laryngoscope Size: Miller and 2 Grade View: Grade I Tube type: Oral Tube size: 7.5 mm Number of attempts: 1 Airway Equipment and Method: Stylet and Oral airway Placement Confirmation: ETT inserted through vocal cords under direct vision,  positive ETCO2 and breath sounds checked- equal and bilateral Secured at: 20 cm Tube secured with: Tape Dental Injury: Teeth and Oropharynx as per pre-operative assessment  Comments: Intubation by Imagene Riches, SRNA.

## 2017-05-20 NOTE — Anesthesia Postprocedure Evaluation (Signed)
Anesthesia Post Note  Patient: Brooke Keller  Procedure(s) Performed: Procedure(s) (LRB): Right  AXILLA-BIFEMORAL Bypass Graft Application Of bilateral femoral provenas (Right)     Patient location during evaluation: PACU Anesthesia Type: General Level of consciousness: sedated Pain management: pain level controlled Vital Signs Assessment: post-procedure vital signs reviewed and stable Respiratory status: spontaneous breathing and respiratory function stable Cardiovascular status: stable Anesthetic complications: no    Last Vitals:  Vitals:   05/20/17 1445 05/20/17 1451  BP:  (!) 133/58  Pulse: 66 (!) 58  Resp: 17 15  Temp:      Last Pain:  Vitals:   05/20/17 1436  TempSrc:   PainSc: 8                  Lollie Gunner DANIEL

## 2017-05-20 NOTE — Interval H&P Note (Signed)
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 After consideration of risks, benefits and other options for treatment, the patient has consented to  Procedure(s): BYPASS GRAFT AXILLA-BIFEMORAL (Right) as a surgical intervention .  The patient's history has been reviewed, patient examined, no change in status, stable for surgery.  I have reviewed the patient's chart and labs.  Questions were answered to the patient's satisfaction.     [name]

## 2017-05-20 NOTE — OR Nursing (Signed)
Dressing over ulcerated areas on right ankle and left heel  noted on arrival to PACU. Dressings clean, dry, intact.

## 2017-05-21 ENCOUNTER — Encounter (HOSPITAL_COMMUNITY): Payer: Self-pay

## 2017-05-21 ENCOUNTER — Encounter (HOSPITAL_COMMUNITY): Payer: Self-pay | Admitting: Vascular Surgery

## 2017-05-21 LAB — GLUCOSE, CAPILLARY
GLUCOSE-CAPILLARY: 127 mg/dL — AB (ref 65–99)
Glucose-Capillary: 153 mg/dL — ABNORMAL HIGH (ref 65–99)
Glucose-Capillary: 137 mg/dL — ABNORMAL HIGH (ref 65–99)

## 2017-05-21 LAB — BASIC METABOLIC PANEL
Anion gap: 9 (ref 5–15)
BUN: 12 mg/dL (ref 6–20)
CHLORIDE: 105 mmol/L (ref 101–111)
CO2: 23 mmol/L (ref 22–32)
Calcium: 7.8 mg/dL — ABNORMAL LOW (ref 8.9–10.3)
Creatinine, Ser: 1.26 mg/dL — ABNORMAL HIGH (ref 0.44–1.00)
GFR, EST AFRICAN AMERICAN: 45 mL/min — AB (ref >60)
GFR, EST NON AFRICAN AMERICAN: 39 mL/min — AB (ref >60)
Glucose, Bld: 179 mg/dL — ABNORMAL HIGH (ref 65–99)
Potassium: 4 mmol/L (ref 3.5–5.1)
SODIUM: 137 mmol/L (ref 135–145)

## 2017-05-21 LAB — CBC
HCT: 25.2 % — ABNORMAL LOW (ref 36.0–46.0)
HEMOGLOBIN: 7.8 g/dL — AB (ref 12.0–15.0)
MCH: 27.8 pg (ref 26.0–34.0)
MCHC: 31 g/dL (ref 30.0–36.0)
MCV: 89.7 fL (ref 78.0–100.0)
PLATELETS: 163 10*3/uL (ref 150–400)
RBC: 2.81 MIL/uL — AB (ref 3.87–5.11)
RDW: 17 % — ABNORMAL HIGH (ref 11.5–15.5)
WBC: 4.3 10*3/uL (ref 4.0–10.5)

## 2017-05-21 MED ORDER — COLLAGENASE 250 UNIT/GM EX OINT
TOPICAL_OINTMENT | Freq: Two times a day (BID) | CUTANEOUS | Status: DC
  Administered 2017-05-21 – 2017-05-26 (×10): via TOPICAL
  Administered 2017-05-26: 1 via TOPICAL
  Administered 2017-05-27 – 2017-05-31 (×10): via TOPICAL
  Administered 2017-06-01: 1 via TOPICAL
  Administered 2017-06-01 – 2017-06-10 (×18): via TOPICAL
  Filled 2017-05-21 (×6): qty 30

## 2017-05-21 NOTE — Progress Notes (Signed)
   VASCULAR SURGERY ASSESSMENT & PLAN:   1 Day Post-Op s/p: Right axillobifemoral bypass.  Good Doppler signals both feet.  Physical therapy. We'll see how she does with physical therapy in order to determine her disposition. She could potentially go home with home health PT versus a short stay in a skilled nursing facility.  Begin dressing changes to wounds on both legs.   SUBJECTIVE:   "Feet feel much better"  PHYSICAL EXAM:   Vitals:   05/21/17 0110 05/21/17 0225 05/21/17 0400 05/21/17 0440  BP: 104/71 94/62 (!) 86/57 (!) 129/55  Pulse: 74 78 80 79  Resp: (!) 22 (!) 22 (!) 21 (!) 22  Temp:    99 F (37.2 C)  TempSrc:    Oral  SpO2: 100% 100% 100% 100%   Brisk right posterior tibial signal with Doppler. Monophasic left posterior tibial signal with the Doppler. Prevena dressings with good seal bilat   LABS:   Lab Results  Component Value Date   WBC 4.3 05/21/2017   HGB 7.8 (L) 05/21/2017   HCT 25.2 (L) 05/21/2017   MCV 89.7 05/21/2017   PLT 163 05/21/2017   Lab Results  Component Value Date   CREATININE 1.26 (H) 05/21/2017   Lab Results  Component Value Date   INR 1.06 05/07/2017   CBG (last 3)   Recent Labs  05/20/17 1321 05/20/17 1806 05/20/17 2023  GLUCAP 158* 101* 148*    PROBLEM LIST:    Active Problems:   Aortoiliac occlusive disease (HCC)   CURRENT MEDS:   . aspirin EC  81 mg Oral Daily  . atorvastatin  10 mg Oral Daily  . brimonidine  1 drop Both Eyes TID  . calcium-vitamin D  1 tablet Oral BID  . docusate sodium  100 mg Oral Daily  . dorzolamide-timolol  1 drop Both Eyes BID  . enoxaparin (LOVENOX) injection  40 mg Subcutaneous Q24H  . furosemide  40 mg Oral BID  . insulin aspart  0-9 Units Subcutaneous TID WC  . ipratropium-albuterol  3 mL Nebulization BID  . latanoprost  1 drop Both Eyes QHS  . lenalidomide  10 mg Oral QODAY  . metFORMIN  500 mg Oral Q breakfast  . pantoprazole  40 mg Oral Daily  . potassium chloride SA   20 mEq Oral BID  . predniSONE  10 mg Oral Daily  . silver sulfADIAZINE  1 application Topical Daily    Gae Gallop Beeper: [phone] Office: [phone] 05/21/2017

## 2017-05-21 NOTE — Consult Note (Signed)
[facility] CM Primary Care Navigator  05/21/2017  Brooke Keller May 27, 1935 [id number]    Met with patient and husband Brooke Keller) at the bedside to identify possible discharge needs.  Patientreports having non-healing wounds to bilateral lower extremitiesthat had led to this admission. Patient endorses Dr. Judd Lien with [place] as his primary care provider.   Patient's husband shared using [facility] pharmacy in [place] to obtain medications without difficulty.   Patient reports that their daughters (Brooke Keller- SW and Brooke Keller- CNA) are managing medications at home using "pill box" system filled weekly.  Daughters are providingtransportation toher doctors'appointments.  Patientstates that her husband is the primary caregiver at home but daughters are actively assisting with her care needs as well.  Anticipated discharge plan is home per patient with home health services per therapy recommendation.  Patient and husband voiced understanding to callprimary care provider's officewhen she gets home,for a post discharge follow-upwithin a week or sooner if needed.Patient letter (with PCP's contact number) was provided as a reminder.  Explained to patientand husband regarding [facility] CM services available for health management but both stated being managed well at home and denies any current needs or concerns at this time.   Patient declined EMMI Calls offered forfollow-up, stating that she will have her daughters checking on her when she gets home.    Patient expressed understandingto request referral from primary care provider to [facility] care management if deemed necessaryfor services in the future. [facility] care management information provided for future needs that may arise.  For questions, please contact:  Brooke Keller, BSN, RN- [facility] Primary Care Navigator  Telephone: [phone] [place]

## 2017-05-21 NOTE — Evaluation (Signed)
Physical Therapy Evaluation Patient Details Name: Brooke Keller MRN: [id number] DOB: Aug 28, 1935 Today's Date: 05/21/2017   History of Present Illness  Pt is an 82 y.o. female s/p R axillobifemoral bypass graft on 8/7. PMHx: CHF, COPD, DM, GERD, HTN, Multiple myeloma.  Clinical Impression  Pt admitted with/for BPG shown above.  Pt requiring at least min assist for mobility and additional assist for lines..  Pt currently limited functionally due to the problems listed below.  (see problems list.)  Pt will benefit from PT to maximize function and safety to be able to get home safely with available assist.     Follow Up Recommendations Home health PT;Supervision/Assistance - 24 hour    Equipment Recommendations  None recommended by PT    Recommendations for Other Services       Precautions / Restrictions Precautions Precautions: Fall Restrictions Weight Bearing Restrictions: No      Mobility  Bed Mobility Overal bed mobility: Needs Assistance Bed Mobility: Supine to Sit;Sit to Supine     Supine to sit: Mod assist;HOB elevated Sit to supine: Mod assist;HOB elevated   General bed mobility comments: Assist for trunk elevation to sitting and scooting hips out to EOB with use of bed pad. Assist provided for return to bed and for repositing toward [facility].  Transfers Overall transfer level: Needs assistance Equipment used: Rolling walker (2 wheeled) Transfers: Sit to/from Stand Sit to Stand: Min assist;+2 safety/equipment         General transfer comment: Min assist to boost up from EOB, cues for hand placement and sequencing. Husband assisting for safety  Ambulation/Gait Ambulation/Gait assistance: Min assist;+2 safety/equipment Ambulation Distance (Feet): 20 Feet Assistive device: Rolling walker (2 wheeled) Gait Pattern/deviations: Step-through pattern   Gait velocity interpretation: Below normal speed for age/gender General Gait Details: pt unable to visually see  environment well, but still moves quickly straining the limits of the lines/tubes.  Mildly unsteady throughout  Stairs            Wheelchair Mobility    Modified Rankin (Stroke Patients Only)       Balance Overall balance assessment: Needs assistance Sitting-balance support: Bilateral upper extremity supported Sitting balance-Leahy Scale: Fair     Standing balance support: Bilateral upper extremity supported Standing balance-Leahy Scale: Poor Standing balance comment: RW for support                             Pertinent Vitals/Pain Pain Assessment: 0-10 Pain Score: 8  Pain Location: abdomen Pain Descriptors / Indicators: Aching;Discomfort;Grimacing;Guarding Pain Intervention(s): Monitored during session;Limited activity within patient's tolerance    Home Living Family/patient expects to be discharged to:: Private residence Living Arrangements: Spouse/significant other Available Help at Discharge: Family;Available 24 hours/day Type of Home: House Home Access: Level entry     Home Layout: Multi-level;Able to live on main level with bedroom/bathroom (pt and husband live in basement) Home Equipment: Walker - 2 wheels;Bedside commode;Shower seat;Grab bars - tub/shower;Wheelchair - manual;Hospital bed (3L home O2)      Prior Function Level of Independence: Needs assistance   Gait / Transfers Assistance Needed: ambulating short distances with HHA  ADL's / Homemaking Assistance Needed: family assisting with ADL        Hand Dominance        Extremity/Trunk Assessment   Upper Extremity Assessment Upper Extremity Assessment: Defer to OT evaluation    Lower Extremity Assessment Lower Extremity Assessment: Overall WFL for tasks assessed;Generalized weakness  Communication   Communication: No difficulties  Cognition Arousal/Alertness: Awake/alert Behavior During Therapy: WFL for tasks assessed/performed Overall Cognitive Status: Within  Functional Limits for tasks assessed                                 General Comments: pt impulsive, not waiting for direction      General Comments General comments (skin integrity, edema, etc.): vitals stable through out    Exercises     Assessment/Plan    PT Assessment Patient needs continued PT services  PT Problem List Decreased strength;Decreased activity tolerance;Decreased mobility;Pain;Decreased knowledge of use of DME;Decreased safety awareness       PT Treatment Interventions Gait training;Functional mobility training;Therapeutic activities;DME instruction;Patient/family education    PT Goals (Current goals can be found in the Care Plan section)  Acute Rehab PT Goals Patient Stated Goal: return home PT Goal Formulation: With patient Time For Goal Achievement: 06/04/17 Potential to Achieve Goals: Good    Frequency Min 3X/week   Barriers to discharge        Co-evaluation               AM-PAC PT "6 Clicks" Daily Activity  Outcome Measure Difficulty turning over in bed (including adjusting bedclothes, sheets and blankets)?: Total Difficulty moving from lying on back to sitting on the side of the bed? : Total Difficulty sitting down on and standing up from a chair with arms (e.g., wheelchair, bedside commode, etc,.)?: Total Help needed moving to and from a bed to chair (including a wheelchair)?: A Little Help needed walking in hospital room?: A Little Help needed climbing 3-5 steps with a railing? : A Lot 6 Click Score: 11    End of Session   Activity Tolerance: Patient tolerated treatment well;Patient limited by pain Patient left: in bed;with call bell/phone within reach;with bed alarm set;with family/visitor present Nurse Communication: Mobility status PT Visit Diagnosis: Unsteadiness on feet (R26.81);Other abnormalities of gait and mobility (R26.89);Muscle weakness (generalized) (M62.81);Pain Pain - part of body:  (abdoment)    Time:  1610-9604 PT Time Calculation (min) (ACUTE ONLY): 24 min   Charges:   PT Evaluation $PT Eval Moderate Complexity: 1 Mod PT Treatments $Gait Training: 8-22 mins   PT G Codes:        06/20/2017  Donnella Sham, PT [phone]  (pager)  Tessie Fass Leyli Kevorkian 2017-06-20, 6:44 PM

## 2017-05-21 NOTE — Evaluation (Signed)
Occupational Therapy Evaluation Patient Details Name: Brooke Keller MRN: [id number] DOB: 12-Apr-1935 Today's Date: 05/21/2017    History of Present Illness Pt is an 82 y.o. female s/p R axillobifemoral bypass graft on 8/7. PMHx: CHF, COPD, DM, GERD, HTN, Multiple myeloma.   Clinical Impression   Pt reports family was assisting with ADL PTA. Currently pt requires min assist +2 for safety with sit to stand and few steps forward/backward at EOB. Pt requires min assist for UB ADL and max assist for LB ADL. Pt presenting with pain, generalized weakness, deconditioning, and impaired balance impacting her independence and safety with ADL and functional mobility. Pt desires to d/c home and is agreeable to [facility] therapies for follow up. At this time recommending HHOT to maximize independence and safety with ADL and functional mobility upon return home. Pending progress, pt may benefit from short term SNF prior to return home with family. Pt would benefit from continued skilled OT to address established goals.    Follow Up Recommendations  Home health OT;Supervision/Assistance - 24 hour    Equipment Recommendations  None recommended by OT    Recommendations for Other Services PT consult     Precautions / Restrictions Precautions Precautions: Fall Restrictions Weight Bearing Restrictions: No      Mobility Bed Mobility Overal bed mobility: Needs Assistance Bed Mobility: Supine to Sit;Sit to Supine     Supine to sit: Mod assist;HOB elevated Sit to supine: Mod assist;HOB elevated   General bed mobility comments: Assist for trunk elevation to sitting and scooting hips out to EOB with use of bed pad. Assist provided for return to bed and for repositing toward [facility].  Transfers Overall transfer level: Needs assistance Equipment used: Rolling walker (2 wheeled) Transfers: Sit to/from Stand Sit to Stand: Min assist;+2 physical assistance;+2 safety/equipment         General transfer comment:  Min assist to boost up from EOB, cues for hand placement and sequencing. Husband assisting for safety    Balance Overall balance assessment: Needs assistance Sitting-balance support: Bilateral upper extremity supported;Feet unsupported Sitting balance-Leahy Scale: Fair     Standing balance support: Bilateral upper extremity supported Standing balance-Leahy Scale: Poor Standing balance comment: RW for support                           ADL either performed or assessed with clinical judgement   ADL Overall ADL's : Needs assistance/impaired Eating/Feeding: Set up;Sitting   Grooming: Set up;Min guard;Sitting   Upper Body Bathing: Minimal assistance;Sitting   Lower Body Bathing: Maximal assistance;Sit to/from stand   Upper Body Dressing : Minimal assistance;Sitting   Lower Body Dressing: Maximal assistance;Sit to/from stand   Toilet Transfer: Minimal assistance;+2 for physical assistance;+2 for safety/equipment;Stand-pivot;RW Toilet Transfer Details (indicate cue type and reason): Simulated by sit to stand from EOB with few steps forward/backward         Functional mobility during ADLs: Minimal assistance;+2 for physical assistance;+2 for safety/equipment;Rolling walker General ADL Comments: Husband assisting as +2 when needed. VSS throghout.     Vision Baseline Vision/History: Legally blind Patient Visual Report: No change from baseline       Perception     Praxis      Pertinent Vitals/Pain Pain Assessment: 0-10 Pain Score: 10-Worst pain ever Pain Location: abdomen Pain Descriptors / Indicators: Aching;Discomfort;Grimacing;Guarding Pain Intervention(s): Monitored during session;Limited activity within patient's tolerance;Patient requesting pain meds-RN notified     Hand Dominance     Extremity/Trunk Assessment  Upper Extremity Assessment Upper Extremity Assessment: Generalized weakness   Lower Extremity Assessment Lower Extremity Assessment: Defer to  PT evaluation       Communication Communication Communication: No difficulties   Cognition Arousal/Alertness: Awake/alert Behavior During Therapy: WFL for tasks assessed/performed Overall Cognitive Status: Within Functional Limits for tasks assessed                                     General Comments       Exercises     Shoulder Instructions      Home Living Family/patient expects to be discharged to:: Private residence Living Arrangements: Spouse/significant other Available Help at Discharge: Family;Available 24 hours/day Type of Home: House Home Access: Level entry     Home Layout: Multi-level;Able to live on main level with bedroom/bathroom (pt and husband live in basement)     Bathroom Shower/Tub: Walk-in Psychologist, prison and probation services: Standard     Home Equipment: Environmental consultant - 2 wheels;Bedside commode;Shower seat;Grab bars - tub/shower;Wheelchair - manual;Hospital bed (3L home O2)          Prior Functioning/Environment Level of Independence: Needs assistance  Gait / Transfers Assistance Needed: ambulating short distances with HHA ADL's / Homemaking Assistance Needed: family assisting with ADL            OT Problem List: Decreased strength;Decreased activity tolerance;Impaired balance (sitting and/or standing);Decreased knowledge of use of DME or AE;Pain;Increased edema      OT Treatment/Interventions: Self-care/ADL training;Therapeutic exercise;Energy conservation;DME and/or AE instruction;Therapeutic activities;Patient/family education;Balance training    OT Goals(Current goals can be found in the care plan section) Acute Rehab OT Goals Patient Stated Goal: return home OT Goal Formulation: With patient/family Time For Goal Achievement: 06/04/17 Potential to Achieve Goals: Good ADL Goals Pt Will Transfer to Toilet: with min guard assist;ambulating;bedside commode (over toilet) Pt Will Perform Toileting - Clothing Manipulation and hygiene: with  min guard assist;sit to/from stand Pt Will Perform Tub/Shower Transfer: Shower transfer;with min guard assist;ambulating;shower seat;grab bars;rolling walker Additional ADL Goal #1: Pt will perform bed mobility with supervision as precursor to ADL.  OT Frequency: Min 2X/week   Barriers to D/C:            Co-evaluation              AM-PAC PT "6 Clicks" Daily Activity     Outcome Measure Help from another person eating meals?: A Little Help from another person taking care of personal grooming?: A Little Help from another person toileting, which includes using toliet, bedpan, or urinal?: A Lot Help from another person bathing (including washing, rinsing, drying)?: A Lot Help from another person to put on and taking off regular upper body clothing?: A Little Help from another person to put on and taking off regular lower body clothing?: A Lot 6 Click Score: 15   End of Session Equipment Utilized During Treatment: Rolling walker;Oxygen Nurse Communication: Patient requests pain meds  Activity Tolerance: Patient tolerated treatment well Patient left: in bed;with call bell/phone within reach;with family/visitor present  OT Visit Diagnosis: Other abnormalities of gait and mobility (R26.89);Muscle weakness (generalized) (M62.81);Pain Pain - part of body:  (abdomen)                Time: 8250-5397 OT Time Calculation (min): 21 min Charges:  OT General Charges $OT Visit: 1 Procedure OT Evaluation $OT Eval Moderate Complexity: 1 Procedure G-Codes:     Pressley Barsky A.  Ulice Brilliant, M.S., OTR/L Pager: [place] 05/21/2017, 9:47 AM

## 2017-05-21 NOTE — Care Management Note (Signed)
Case Management Note Marvetta Gibbons RN, BSN Unit 4E-Case Manager [phone]  Patient Details  Name: Brooke Keller MRN: [id number] Date of Birth: 07-10-1935  Subjective/Objective:   Pt admitted  s/p: Right axillobifemoral bypass.- has prevena wound vacs placed               Action/Plan: PTA pt lived at home with spouse- PT/OT evals ordered/pending- CM will follow for recommendations and d/c needs  Expected Discharge Date:                  Expected Discharge Plan:  [place]  In-House Referral:     Discharge planning Services  CM Consult  Post Acute Care Choice:  Home Health Choice offered to:     DME Arranged:    DME Agency:     HH Arranged:    [place] Agency:     Status of Service:  In process, will continue to follow  If discussed at Long Length of Stay Meetings, dates discussed:    Discharge Disposition:   Additional Comments:  Dawayne Patricia, RN 05/21/2017, 12:54 PM

## 2017-05-22 ENCOUNTER — Inpatient Hospital Stay (HOSPITAL_COMMUNITY): Payer: Medicare Other

## 2017-05-22 DIAGNOSIS — I7409 Other arterial embolism and thrombosis of abdominal aorta: Secondary | ICD-10-CM

## 2017-05-22 LAB — TYPE AND SCREEN
ABO/RH(D): A POS
Antibody Screen: NEGATIVE
UNIT DIVISION: 0
Unit division: 0

## 2017-05-22 LAB — BPAM RBC
Blood Product Expiration Date: 201808282359
Blood Product Expiration Date: 201808292359
Unit Type and Rh: 6200
Unit Type and Rh: 6200

## 2017-05-22 LAB — GLUCOSE, CAPILLARY
GLUCOSE-CAPILLARY: 140 mg/dL — AB (ref 65–99)
Glucose-Capillary: 150 mg/dL — ABNORMAL HIGH (ref 65–99)
Glucose-Capillary: 167 mg/dL — ABNORMAL HIGH (ref 65–99)

## 2017-05-22 NOTE — Progress Notes (Signed)
Occupational Therapy Treatment Patient Details Name: Brooke Keller MRN: [id number] DOB: [birth year] Today's Date: 05/22/2017    History of present illness Pt is an 82 y.o. female s/p R axillobifemoral bypass graft on 8/7. PMHx: CHF, COPD, DM, GERD, HTN, Multiple myeloma.   OT comments  Pt with increased fatigue today, only able to tolerate standing at EOB for brief periods.  Intermittent confusion noted.  She requires mod -max A for ADLs   Family supportive and able to provide necessary level of care at discharge.   Follow Up Recommendations  Home health OT;Supervision/Assistance - 24 hour    Equipment Recommendations  None recommended by OT    Recommendations for Other Services      Precautions / Restrictions Precautions Precautions: Fall       Mobility Bed Mobility Overal bed mobility: Needs Assistance Bed Mobility: Supine to Sit;Sit to Supine     Supine to sit: Mod assist Sit to supine: Max assist   General bed mobility comments: Pt requires assist to move LEs off EOB and to lift trunk.  WHen she fatigues, she falls back onto bed in full extension requiring total A to lift LEs back onto bed and max A to rotate trunk into correct position   Transfers Overall transfer level: Needs assistance Equipment used: Rolling walker (2 wheeled) Transfers: Sit to/from [facility] Sit to Stand: Min assist         General transfer comment: Pt stood x 2 for 45 seconds and 15 seconds.  Too fatigued to attempt transfers or grooming at sink     Balance Overall balance assessment: Needs assistance Sitting-balance support: Bilateral upper extremity supported Sitting balance-Leahy Scale: Fair     Standing balance support: Bilateral upper extremity supported Standing balance-Leahy Scale: Poor Standing balance comment: RW for support                           ADL either performed or assessed with clinical judgement   ADL Overall ADL's : Needs  assistance/impaired                         Toilet Transfer: Minimal assistance;Stand-pivot;BSC;RW                   Vision   Additional Comments: Per spouse, pt is blind in one eye, and "can't see good out of the other".  Pt does not make eye contact and often looks past therapist    Perception     Praxis      Cognition Arousal/Alertness: Awake/alert Behavior During Therapy: WFL for tasks assessed/performed Overall Cognitive Status: Impaired/Different from baseline                                 General Comments: intermittent confusion noted - spouse states this started this am.  Pt impulsive, requires cues for safety         Exercises     Shoulder Instructions       General Comments spouse present     Pertinent Vitals/ Pain       Pain Assessment: Faces Faces Pain Scale: Hurts little more Pain Location: abdomen Pain Descriptors / Indicators: Aching;Discomfort;Grimacing;Guarding Pain Intervention(s): Monitored during session  Home Living  Prior Functioning/Environment              Frequency  Min 2X/week        Progress Toward Goals  OT Goals(current goals can now be found in the care plan section)  Progress towards OT goals: Progressing toward goals     Plan Discharge plan remains appropriate    Co-evaluation                 AM-PAC PT "6 Clicks" Daily Activity     Outcome Measure   Help from another person eating meals?: A Little Help from another person taking care of personal grooming?: A Little Help from another person toileting, which includes using toliet, bedpan, or urinal?: A Lot Help from another person bathing (including washing, rinsing, drying)?: A Lot Help from another person to put on and taking off regular upper body clothing?: A Little Help from another person to put on and taking off regular lower body clothing?: A Lot 6 Click  Score: 15    End of Session Equipment Utilized During Treatment: Rolling walker;Gait belt  OT Visit Diagnosis: Unsteadiness on feet (R26.81);Low vision, both eyes (H54.2)   Activity Tolerance Patient limited by fatigue   Patient Left in bed;with call bell/phone within reach;with bed alarm set   Nurse Communication Mobility status        Time: 1308-6578 OT Time Calculation (min): 37 min  Charges: OT General Charges $OT Visit: 1 Procedure OT Treatments $Therapeutic Activity: 23-37 mins  [facility], OTR/L [phone]    [name] M 05/22/2017, 6:52 PM

## 2017-05-22 NOTE — Progress Notes (Signed)
   05/22/17 1010  Clinical Encounter Type  Visited With Patient and family together  Visit Type Other (Comment) ([place] consult)  Spiritual Encounters  Spiritual Needs Emotional  Stress Factors  Patient Stress Factors Health changes  Family Stress Factors Family relationships  Introduction to Pt and family. All are coping. [employer] of presence.

## 2017-05-22 NOTE — Progress Notes (Addendum)
VASCULAR LAB PRELIMINARY  ARTERIAL  ABI completed:    RIGHT    LEFT    PRESSURE WAVEFORM  PRESSURE WAVEFORM  BRACHIAL 85 Triphasic BRACHIAL 123 Triphasic  AT 85 Triphasic DP 81 Triphasic  PT 73 Triphasic PT 80 Triphasic    RIGHT LEFT  ABI 0.69 0.66   Technically difficult to evaluate due to involuntary movement of the feet and cardiac arrhythmia. Bilateral ABIs indicate a moderate reduction in arterial flow at rest. Unable to obtain toe pressures due to constant movement of the toes.   [place], RVS 05/22/2017, 5:13 PM

## 2017-05-22 NOTE — Progress Notes (Addendum)
  Progress Note  SUBJECTIVE:    POD #2  Still needing IV pain meds.   OBJECTIVE:   Vitals:   05/21/17 2000 05/22/17 0400  BP: (!) 110/59 (!) 125/58  Pulse: 80 72  Resp: 20 15  Temp:  98 F (36.7 C)    Intake/Output Summary (Last 24 hours) at 05/22/17 0752 Last data filed at 05/22/17 0700  Gross per 24 hour  Intake                0 ml  Output              850 ml  Net             -850 ml   Right infraclavicular incision intact without hematoma. Bilateral groin incisions with incisional vacs. Brisk PT signals bilaterally.  Wounds on bilateral heels clean.   ASSESSMENT/PLAN:   82 y.o. female is s/p: right axillobifemoral bypass 2 Days Post-Op   Good doppler flow in feet bilaterally.  Wounds are clean bilaterally. Continue dressing dressings.  PT/OT recommending home health.  D/c foley.  Home in next few days once pain better controlled.   [name] 05/22/2017 7:52 AM -- LABS:   CBC    Component Value Date/Time   WBC 4.3 05/21/2017 0436   HGB 7.8 (L) 05/21/2017 0436   HCT 25.2 (L) 05/21/2017 0436   PLT 163 05/21/2017 0436    BMET    Component Value Date/Time   NA 137 05/21/2017 0436   K 4.0 05/21/2017 0436   CL 105 05/21/2017 0436   CO2 23 05/21/2017 0436   GLUCOSE 179 (H) 05/21/2017 0436   BUN 12 05/21/2017 0436   CREATININE 1.26 (H) 05/21/2017 0436   CALCIUM 7.8 (L) 05/21/2017 0436   GFRNONAA 39 (L) 05/21/2017 0436   GFRAA 45 (L) 05/21/2017 0436    COAG Lab Results  Component Value Date   INR 1.06 05/07/2017   INR 0.94 10/15/2012   INR 1.01 10/01/2011   No results found for: PTT  ANTIBIOTICS:   Anti-infectives    Start     Dose/Rate Route Frequency Ordered Stop   05/20/17 2200  cefUROXime (ZINACEF) 1.5 g in dextrose 5 % 50 mL IVPB     1.5 g 100 mL/hr over 30 Minutes Intravenous Every 12 hours 05/20/17 1756 05/21/17 1034   05/20/17 0850  dextrose 5 % with cefUROXime (ZINACEF) ADS Med    Comments:  Ardine Eng   : cabinet  override      05/20/17 0850 05/20/17 1033   05/20/17 0837  cefUROXime (ZINACEF) 1.5 g in dextrose 5 % 50 mL IVPB     1.5 g 100 mL/hr over 30 Minutes Intravenous 30 min pre-op 05/20/17 0837 05/20/17 [place], PA-C [facility] Office: [phone] Pager: [phone] 05/22/2017 7:52 AM  I have interviewed the patient and examined the patient. I agree with the findings by the PA. Doing well with PTx. They have recommended HHPTx. Possibly home tomorrow or Saturday.    Gae Gallop, MD [phone]

## 2017-05-23 ENCOUNTER — Inpatient Hospital Stay (HOSPITAL_COMMUNITY): Payer: Medicare Other

## 2017-05-23 LAB — GLUCOSE, CAPILLARY
GLUCOSE-CAPILLARY: 163 mg/dL — AB (ref 65–99)
GLUCOSE-CAPILLARY: 149 mg/dL — AB (ref 65–99)
Glucose-Capillary: 153 mg/dL — ABNORMAL HIGH (ref 65–99)
Glucose-Capillary: 133 mg/dL — ABNORMAL HIGH (ref 65–99)
Glucose-Capillary: 146 mg/dL — ABNORMAL HIGH (ref 65–99)

## 2017-05-23 LAB — PREPARE RBC (CROSSMATCH)

## 2017-05-23 MED ORDER — ACETYLCYSTEINE 10% NICU INHALATION SOLUTION: 2.0000 mL | Freq: Two times a day (BID) | RESPIRATORY_TRACT | Status: DC

## 2017-05-23 MED ORDER — SODIUM CHLORIDE 0.9 % IV SOLN
Freq: Once | INTRAVENOUS | Status: AC
  Administered 2017-05-23: 08:00:00 via INTRAVENOUS

## 2017-05-23 MED ORDER — FUROSEMIDE 10 MG/ML IJ SOLN: 40.0000 mg | Freq: Once | INTRAMUSCULAR | Status: DC

## 2017-05-23 MED ORDER — ACETYLCYSTEINE 10 % IN SOLN
2.0000 mL | Freq: Two times a day (BID) | RESPIRATORY_TRACT | Status: DC
  Filled 2017-05-23 (×2): qty 2

## 2017-05-23 MED ORDER — ACETYLCYSTEINE 20 % IN SOLN
2.0000 mL | Freq: Two times a day (BID) | RESPIRATORY_TRACT | Status: DC
  Administered 2017-05-23: 22:00:00 via RESPIRATORY_TRACT
  Administered 2017-05-24 – 2017-05-25 (×3): 2 mL via RESPIRATORY_TRACT
  Filled 2017-05-23 (×5): qty 4

## 2017-05-23 NOTE — Progress Notes (Signed)
PT Cancellation Note  Patient Details Name: Brooke Keller MRN: [id number] DOB: 1935-07-03   Cancelled Treatment:    Reason Eval/Treat Not Completed: Patient declined, no reason specified Pt just got back into bed after transferring to [facility]. Declined working with PT despite education on importance of mobility with healing and for ileus. Will follow up.   Marguarite Arbour A Julianne Chamberlin 05/23/2017, 4:26 PM Wray Kearns, [place], DPT [phone]

## 2017-05-23 NOTE — Significant Event (Addendum)
Rapid Response Event Note  Overview: Time Called: 0322 Arrival Time: 0330 Event Type: Other (Comment)  Initial Focused Assessment: Called by primary RN to assess patient for abdominal distention.  Patient is s/p right axillobifemoral bypass POD # 2. Per RN, abdomen is more distended now that earlier. Patient vomited a small amount of bile prior to the RN calling me.    Post op, patient has had some confusion but during my exam was alert, able to follow commands, AxO 2-3, patient stated after vomiting she felt better.  Abdomen is quite distended, tight, hypoactive bowel sounds in all quadrants, + gas, - BM (patient is receiving PRN bowel regimen).   Zofran and pain medications have not given for several hours (maybe playing into the confusion >?).  VS: 136/81, HR 90s, 100% on 2L. RR 18-20.   Interventions: RN spoke with VVS MD on call. - orders for KUB STAT and Zofran 4mg  IV - waiting for KUB results, patient is resting comfortably now - MD paged with KUB results: Distended small bowel loops measure up to 5.8 cm diameter. Air and stool filled colonic loops also noted. This most likely reflects diffuse ileus. No free intra-abdominal air seen. - Primary RN called MD with results, no acute interventions, promote physical mobility today.  Plan of Care (if not transferred): - call RR RN again if needed.  Event Summary: Name of Physician Notified: Dr. Bridgett Larsson at [place]    at    Outcome: Stayed in room and stabalized  Event End Time: [place], [place]

## 2017-05-23 NOTE — Progress Notes (Signed)
   VASCULAR SURGERY ASSESSMENT & PLAN:   3 Days Post-Op s/p: Axillobifemoral bypass. Graft is patent. Good posterior tibial signals in both feet and wounds on her feet are improving.  ILEUS: I have recommended an NG tube however she is refusing this. We'll keep her nothing by mouth except ice chips.  ACUTE BLOOD LOSS ANEMIA: We'll transfuse 2 units slowly. The family states that in the past anemia has caused confusion.  CONFUSION: She has no electrolyte abnormalities. There are no signs of sepsis. Her incisions look good. She is afebrile and has a normal white blood cell count.   SUBJECTIVE:   This morning she is not confused but she was reportedly confused last night. This morning she denies any nausea. She did pass flatus yesterday.   PHYSICAL EXAM:   Vitals:   05/22/17 2000 05/22/17 2015 05/23/17 0000 05/23/17 0400  BP: 131/69  (!) 110/44 136/61  Pulse: 92  91 92  Resp: (!) 21  (!) 23 (!) 23  Temp: 99 F (37.2 C)     TempSrc: Oral     SpO2: 100% 100% 100% 100%  Weight:      Height:       ABDOMEN:  Hypoactive bowel sounds.  Her incisions all look good.  Brisk posterior tibial signal with the Doppler in both feet.  Her right lateral malleolar wound is healing nicely.  Her left heel wound is stable.   LABS:   Lab Results  Component Value Date   WBC 4.3 05/21/2017   HGB 7.8 (L) 05/21/2017   HCT 25.2 (L) 05/21/2017   MCV 89.7 05/21/2017   PLT 163 05/21/2017   Lab Results  Component Value Date   CREATININE 1.26 (H) 05/21/2017   Lab Results  Component Value Date   INR 1.06 05/07/2017   CBG (last 3)   Recent Labs  05/22/17 1632 05/22/17 2147 05/23/17 0635  GLUCAP 167* 153* 133*    PROBLEM LIST:    Active Problems:   Aortoiliac occlusive disease (HCC)   CURRENT MEDS:   . aspirin EC  81 mg Oral Daily  . atorvastatin  10 mg Oral Daily  . brimonidine  1 drop Both Eyes TID  . calcium-vitamin D  1 tablet Oral BID  . collagenase   Topical BID  .  docusate sodium  100 mg Oral Daily  . dorzolamide-timolol  1 drop Both Eyes BID  . enoxaparin (LOVENOX) injection  40 mg Subcutaneous Q24H  . furosemide  40 mg Oral BID  . insulin aspart  0-9 Units Subcutaneous TID WC  . ipratropium-albuterol  3 mL Nebulization BID  . latanoprost  1 drop Both Eyes QHS  . lenalidomide  10 mg Oral QODAY  . metFORMIN  500 mg Oral Q breakfast  . pantoprazole  40 mg Oral Daily  . potassium chloride SA  20 mEq Oral BID  . predniSONE  10 mg Oral Daily  . silver sulfADIAZINE  1 application Topical Daily    Gae Gallop Beeper: [phone] Office: [phone] 05/23/2017

## 2017-05-23 NOTE — Progress Notes (Signed)
Patient vomited yellow colored vomitus in moderate amount(at least  150 cc) abdomen is more distended compare to last assessment last night.patient  V/S is stable,HR-94,O2 sat is 99% on 2 LPM/[place] of O2,not in distress.Dr. Bridgett Larsson made aware with orders to do KUB and Zofran IV prn given.Will continue to monitor.

## 2017-05-23 NOTE — Progress Notes (Signed)
Dressing changes to bilateral heels done without difficulty.

## 2017-05-23 NOTE — Progress Notes (Signed)
   VASCULAR SURGERY ASSESSMENT & PLAN:   3 Days Post-Op s/p: Right axillobifemoral bypass  Doing well   SUBJECTIVE:   No complaints  PHYSICAL EXAM:   Vitals:   05/22/17 2000 05/22/17 2015 05/23/17 0000 05/23/17 0400  BP: 131/69  (!) 110/44 136/61  Pulse: 92  91 92  Resp: (!) 21  (!) 23 (!) 23  Temp: 99 F (37.2 C)     TempSrc: Oral     SpO2: 100% 100% 100% 100%  Weight:      Height:       Feet warm  Incisions ok  LABS:   Lab Results  Component Value Date   WBC 4.3 05/21/2017   HGB 7.8 (L) 05/21/2017   HCT 25.2 (L) 05/21/2017   MCV 89.7 05/21/2017   PLT 163 05/21/2017   Lab Results  Component Value Date   CREATININE 1.26 (H) 05/21/2017   Lab Results  Component Value Date   INR 1.06 05/07/2017   CBG (last 3)   Recent Labs  05/22/17 1632 05/22/17 2147 05/23/17 0635  GLUCAP 167* 153* 133*    PROBLEM LIST:    Active Problems:   Aortoiliac occlusive disease (HCC)   CURRENT MEDS:   . aspirin EC  81 mg Oral Daily  . atorvastatin  10 mg Oral Daily  . brimonidine  1 drop Both Eyes TID  . calcium-vitamin D  1 tablet Oral BID  . collagenase   Topical BID  . docusate sodium  100 mg Oral Daily  . dorzolamide-timolol  1 drop Both Eyes BID  . enoxaparin (LOVENOX) injection  40 mg Subcutaneous Q24H  . furosemide  40 mg Oral BID  . insulin aspart  0-9 Units Subcutaneous TID WC  . ipratropium-albuterol  3 mL Nebulization BID  . latanoprost  1 drop Both Eyes QHS  . lenalidomide  10 mg Oral QODAY  . metFORMIN  500 mg Oral Q breakfast  . pantoprazole  40 mg Oral Daily  . potassium chloride SA  20 mEq Oral BID  . predniSONE  10 mg Oral Daily  . silver sulfADIAZINE  1 application Topical Daily    Gae Gallop Beeper: [phone] Office: [phone] 05/23/2017

## 2017-05-24 ENCOUNTER — Inpatient Hospital Stay (HOSPITAL_COMMUNITY): Payer: Medicare Other

## 2017-05-24 DIAGNOSIS — R0602 Shortness of breath: Secondary | ICD-10-CM

## 2017-05-24 DIAGNOSIS — R0603 Acute respiratory distress: Secondary | ICD-10-CM

## 2017-05-24 DIAGNOSIS — R0600 Dyspnea, unspecified: Secondary | ICD-10-CM

## 2017-05-24 LAB — POCT I-STAT 3, ART BLOOD GAS (G3+)
Acid-base deficit: 2 mmol/L (ref 0.0–2.0)
Bicarbonate: 27 mmol/L (ref 20.0–28.0)
O2 Saturation: 95 %
PCO2 ART: 66.8 mmHg — AB (ref 32.0–48.0)
PH ART: 7.209 — AB (ref 7.350–7.450)
Patient temperature: 97
TCO2: 29 mmol/L (ref 0–100)
pO2, Arterial: 92 mmHg (ref 83.0–108.0)

## 2017-05-24 LAB — TYPE AND SCREEN
ABO/RH(D): A POS
Antibody Screen: NEGATIVE
UNIT DIVISION: 0
UNIT DIVISION: 0
Unit division: 0

## 2017-05-24 LAB — BPAM RBC
BLOOD PRODUCT EXPIRATION DATE: 201808112359
BLOOD PRODUCT EXPIRATION DATE: 201808292359
Blood Product Expiration Date: 201808292359
ISSUE DATE / TIME: 201808101608
ISSUE DATE / TIME: 201808101203
ISSUE DATE / TIME: 201808101935
UNIT TYPE AND RH: 6200
Unit Type and Rh: 6200
Unit Type and Rh: 9500

## 2017-05-24 LAB — CBC WITH DIFFERENTIAL/PLATELET
BASOS PCT: 0 %
Basophils Absolute: 0 10*3/uL (ref 0.0–0.1)
EOS ABS: 0.4 10*3/uL (ref 0.0–0.7)
EOS PCT: 3 %
HCT: 32.9 % — ABNORMAL LOW (ref 36.0–46.0)
Hemoglobin: 10.8 g/dL — ABNORMAL LOW (ref 12.0–15.0)
LYMPHS ABS: 1.4 10*3/uL (ref 0.7–4.0)
Lymphocytes Relative: 11 %
MCH: 29.2 pg (ref 26.0–34.0)
MCHC: 32.8 g/dL (ref 30.0–36.0)
MCV: 88.9 fL (ref 78.0–100.0)
MONO ABS: 1 10*3/uL (ref 0.1–1.0)
Monocytes Relative: 8 %
Neutro Abs: 9.7 10*3/uL — ABNORMAL HIGH (ref 1.7–7.7)
Neutrophils Relative %: 78 %
PLATELETS: 232 10*3/uL (ref 150–400)
RBC: 3.7 MIL/uL — ABNORMAL LOW (ref 3.87–5.11)
RDW: 16.5 % — AB (ref 11.5–15.5)
WBC: 12.5 10*3/uL — AB (ref 4.0–10.5)

## 2017-05-24 LAB — PROCALCITONIN: PROCALCITONIN: 6.92 ng/mL

## 2017-05-24 LAB — BLOOD GAS, ARTERIAL
ACID-BASE DEFICIT: 2 mmol/L (ref 0.0–2.0)
Acid-base deficit: 4.3 mmol/L — ABNORMAL HIGH (ref 0.0–2.0)
Bicarbonate: 20.2 mmol/L (ref 20.0–28.0)
Bicarbonate: 23.4 mmol/L (ref 20.0–28.0)
DRAWN BY: 105521
DRAWN BY: 28340
Delivery systems: POSITIVE
Expiratory PAP: 4
FIO2: 100
Inspiratory PAP: 14
LHR: 38 {breaths}/min
MECHVT: 340 mL
Mode: POSITIVE
O2 Content: 10 L/min
O2 SAT: 84.2 %
O2 SAT: 98.4 %
PATIENT TEMPERATURE: 98.6
PATIENT TEMPERATURE: 100.1
PCO2 ART: 36.8 mmHg (ref 32.0–48.0)
PEEP/CPAP: 7 cmH2O
PH ART: 7.358 (ref 7.350–7.450)
PH ART: 7.294 — AB (ref 7.350–7.450)
VT: 400 mL
pCO2 arterial: 50.4 mmHg — ABNORMAL HIGH (ref 32.0–48.0)
pO2, Arterial: 50.7 mmHg — ABNORMAL LOW (ref 83.0–108.0)
pO2, Arterial: 128 mmHg — ABNORMAL HIGH (ref 83.0–108.0)

## 2017-05-24 LAB — LACTIC ACID, PLASMA
Lactic Acid, Venous: 2.9 mmol/L (ref 0.5–1.9)
Lactic Acid, Venous: 2.3 mmol/L (ref 0.5–1.9)
Lactic Acid, Venous: 2.2 mmol/L (ref 0.5–1.9)

## 2017-05-24 LAB — BASIC METABOLIC PANEL
ANION GAP: 12 (ref 5–15)
ANION GAP: 11 (ref 5–15)
BUN: 19 mg/dL (ref 6–20)
BUN: 29 mg/dL — AB (ref 6–20)
CALCIUM: 8.5 mg/dL — AB (ref 8.9–10.3)
CHLORIDE: 101 mmol/L (ref 101–111)
CO2: 23 mmol/L (ref 22–32)
CO2: 24 mmol/L (ref 22–32)
Calcium: 8.8 mg/dL — ABNORMAL LOW (ref 8.9–10.3)
Chloride: 100 mmol/L — ABNORMAL LOW (ref 101–111)
Creatinine, Ser: 1.25 mg/dL — ABNORMAL HIGH (ref 0.44–1.00)
Creatinine, Ser: 1.45 mg/dL — ABNORMAL HIGH (ref 0.44–1.00)
GFR calc Af Amer: 38 mL/min — ABNORMAL LOW (ref >60)
GFR calc non Af Amer: 40 mL/min — ABNORMAL LOW (ref >60)
GFR, EST AFRICAN AMERICAN: 46 mL/min — AB (ref >60)
GFR, EST NON AFRICAN AMERICAN: 33 mL/min — AB (ref >60)
GLUCOSE: 171 mg/dL — AB (ref 65–99)
GLUCOSE: 188 mg/dL — AB (ref 65–99)
POTASSIUM: 4 mmol/L (ref 3.5–5.1)
Potassium: 4.2 mmol/L (ref 3.5–5.1)
SODIUM: 135 mmol/L (ref 135–145)
Sodium: 136 mmol/L (ref 135–145)

## 2017-05-24 LAB — GLUCOSE, CAPILLARY
GLUCOSE-CAPILLARY: 151 mg/dL — AB (ref 65–99)
GLUCOSE-CAPILLARY: 204 mg/dL — AB (ref 65–99)
Glucose-Capillary: 171 mg/dL — ABNORMAL HIGH (ref 65–99)
Glucose-Capillary: 198 mg/dL — ABNORMAL HIGH (ref 65–99)
Glucose-Capillary: 235 mg/dL — ABNORMAL HIGH (ref 65–99)

## 2017-05-24 LAB — CBC
HEMATOCRIT: 34.3 % — AB (ref 36.0–46.0)
Hemoglobin: 11.4 g/dL — ABNORMAL LOW (ref 12.0–15.0)
MCH: 29.1 pg (ref 26.0–34.0)
MCHC: 33.2 g/dL (ref 30.0–36.0)
MCV: 87.5 fL (ref 78.0–100.0)
PLATELETS: 182 10*3/uL (ref 150–400)
RBC: 3.92 MIL/uL (ref 3.87–5.11)
RDW: 16.2 % — AB (ref 11.5–15.5)
WBC: 4.7 10*3/uL (ref 4.0–10.5)

## 2017-05-24 LAB — TROPONIN I
TROPONIN I: 0.27 ng/mL — AB (ref <0.03)
Troponin I: 0.31 ng/mL (ref <0.03)

## 2017-05-24 MED ORDER — FUROSEMIDE 10 MG/ML IJ SOLN
40.0000 mg | Freq: Once | INTRAMUSCULAR | Status: AC
  Administered 2017-05-24: 40 mg via INTRAVENOUS
  Filled 2017-05-24: qty 4

## 2017-05-24 MED ORDER — NOREPINEPHRINE BITARTRATE 1 MG/ML IV SOLN
0.0000 ug/min | INTRAVENOUS | Status: DC
  Administered 2017-05-24: 10 ug/min via INTRAVENOUS
  Administered 2017-05-24 (×2): 18 ug/min via INTRAVENOUS
  Filled 2017-05-24 (×2): qty 4

## 2017-05-24 MED ORDER — SODIUM CHLORIDE 0.9 % IV SOLN
0.1000 ug/kg/h | INTRAVENOUS | Status: DC
  Administered 2017-05-24 (×2): 0.4 ug/kg/h via INTRAVENOUS
  Administered 2017-05-25: 0.7 ug/kg/h via INTRAVENOUS
  Administered 2017-05-25: 0.598 ug/kg/h via INTRAVENOUS
  Administered 2017-05-25: 0.7 ug/kg/h via INTRAVENOUS
  Administered 2017-05-25: 0.5 ug/kg/h via INTRAVENOUS
  Administered 2017-05-26: 0.7 ug/kg/h via INTRAVENOUS
  Administered 2017-05-26: 0.3 ug/kg/h via INTRAVENOUS
  Administered 2017-05-26: 0.7 ug/kg/h via INTRAVENOUS
  Administered 2017-05-27: 0.3 ug/kg/h via INTRAVENOUS
  Filled 2017-05-24 (×11): qty 2

## 2017-05-24 MED ORDER — ROCURONIUM BROMIDE 50 MG/5ML IV SOLN
60.0000 mg | Freq: Once | INTRAVENOUS | Status: AC
  Administered 2017-05-24: 60 mg via INTRAVENOUS

## 2017-05-24 MED ORDER — DILTIAZEM HCL 100 MG IV SOLR
5.0000 mg/h | INTRAVENOUS | Status: DC
  Administered 2017-05-24: 10 mg/h via INTRAVENOUS
  Filled 2017-05-24: qty 100

## 2017-05-24 MED ORDER — ONDANSETRON HCL 4 MG/2ML IJ SOLN
4.0000 mg | Freq: Once | INTRAMUSCULAR | Status: AC
  Administered 2017-05-24: 4 mg via INTRAVENOUS

## 2017-05-24 MED ORDER — ENOXAPARIN SODIUM 30 MG/0.3ML ~~LOC~~ SOLN
30.0000 mg | SUBCUTANEOUS | Status: DC
  Administered 2017-05-25: 30 mg via SUBCUTANEOUS
  Filled 2017-05-24: qty 0.3

## 2017-05-24 MED ORDER — ASPIRIN 300 MG RE SUPP
150.0000 mg | Freq: Every day | RECTAL | Status: DC
  Administered 2017-05-24 – 2017-05-27 (×4): 150 mg via RECTAL
  Filled 2017-05-24 (×4): qty 1

## 2017-05-24 MED ORDER — CHLORHEXIDINE GLUCONATE CLOTH 2 % EX PADS
6.0000 | MEDICATED_PAD | Freq: Every day | CUTANEOUS | Status: DC
  Administered 2017-05-25 – 2017-06-08 (×16): 6 via TOPICAL

## 2017-05-24 MED ORDER — AMIODARONE HCL IN DEXTROSE 360-4.14 MG/200ML-% IV SOLN: 60.0000 mg/h | INTRAVENOUS | Status: DC

## 2017-05-24 MED ORDER — LINEZOLID 600 MG/300ML IV SOLN
600.0000 mg | Freq: Two times a day (BID) | INTRAVENOUS | Status: DC
  Administered 2017-05-24 – 2017-05-27 (×6): 600 mg via INTRAVENOUS
  Filled 2017-05-24 (×6): qty 300

## 2017-05-24 MED ORDER — ACETAMINOPHEN 10 MG/ML IV SOLN
1000.0000 mg | Freq: Four times a day (QID) | INTRAVENOUS | Status: AC
  Administered 2017-05-24 – 2017-05-25 (×4): 1000 mg via INTRAVENOUS
  Filled 2017-05-24 (×4): qty 100

## 2017-05-24 MED ORDER — PIPERACILLIN-TAZOBACTAM 3.375 G IVPB
3.3750 g | Freq: Three times a day (TID) | INTRAVENOUS | Status: DC
  Administered 2017-05-24 – 2017-05-25 (×2): 3.375 g via INTRAVENOUS
  Filled 2017-05-24 (×3): qty 50

## 2017-05-24 MED ORDER — PIPERACILLIN-TAZOBACTAM 3.375 G IVPB 30 MIN: 3.3750 g | Freq: Three times a day (TID) | INTRAVENOUS | Status: DC

## 2017-05-24 MED ORDER — METHYLNALTREXONE BROMIDE 12 MG/0.6ML ~~LOC~~ SOLN
6.0000 mg | Freq: Once | SUBCUTANEOUS | Status: AC
  Administered 2017-05-24: 6 mg via SUBCUTANEOUS
  Filled 2017-05-24: qty 0.6

## 2017-05-24 MED ORDER — AMIODARONE HCL IN DEXTROSE 360-4.14 MG/200ML-% IV SOLN
INTRAVENOUS | Status: AC
  Filled 2017-05-24: qty 200

## 2017-05-24 MED ORDER — LACTATED RINGERS IV SOLN
INTRAVENOUS | Status: DC
  Administered 2017-05-24 – 2017-05-28 (×9): via INTRAVENOUS

## 2017-05-24 MED ORDER — ETOMIDATE 2 MG/ML IV SOLN
16.0000 mg | Freq: Once | INTRAVENOUS | Status: AC
  Administered 2017-05-24: 16 mg via INTRAVENOUS

## 2017-05-24 MED ORDER — NOREPINEPHRINE BITARTRATE 1 MG/ML IV SOLN
0.0000 ug/min | INTRAVENOUS | Status: DC
  Administered 2017-05-24 – 2017-05-25 (×2): 30 ug/min via INTRAVENOUS
  Administered 2017-05-25: 12 ug/min via INTRAVENOUS
  Filled 2017-05-24 (×3): qty 16

## 2017-05-24 MED ORDER — FENTANYL CITRATE (PF) 100 MCG/2ML IJ SOLN
INTRAMUSCULAR | Status: AC
  Filled 2017-05-24: qty 2

## 2017-05-24 MED ORDER — AMIODARONE HCL IN DEXTROSE 360-4.14 MG/200ML-% IV SOLN: 30.0000 mg/h | INTRAVENOUS | Status: DC

## 2017-05-24 MED ORDER — SODIUM CHLORIDE 0.9% FLUSH
10.0000 mL | Freq: Two times a day (BID) | INTRAVENOUS | Status: DC
  Administered 2017-05-24 – 2017-05-27 (×5): 10 mL
  Administered 2017-05-29 (×2): 20 mL
  Administered 2017-05-30 (×2): 10 mL
  Administered 2017-05-31: 20 mL
  Administered 2017-05-31 – 2017-06-03 (×6): 10 mL

## 2017-05-24 MED ORDER — INSULIN ASPART 100 UNIT/ML ~~LOC~~ SOLN
0.0000 [IU] | SUBCUTANEOUS | Status: DC
  Administered 2017-05-24: 2 [IU] via SUBCUTANEOUS
  Administered 2017-05-24: 3 [IU] via SUBCUTANEOUS
  Administered 2017-05-25 – 2017-05-26 (×8): 2 [IU] via SUBCUTANEOUS
  Administered 2017-05-26: 3 [IU] via SUBCUTANEOUS
  Administered 2017-05-26: 2 [IU] via SUBCUTANEOUS
  Administered 2017-05-26: 5 [IU] via SUBCUTANEOUS
  Administered 2017-05-27: 2 [IU] via SUBCUTANEOUS
  Administered 2017-05-27 (×2): 3 [IU] via SUBCUTANEOUS

## 2017-05-24 MED ORDER — FUROSEMIDE 10 MG/ML IJ SOLN
40.0000 mg | Freq: Once | INTRAMUSCULAR | Status: AC
  Administered 2017-05-24: 40 mg via INTRAVENOUS

## 2017-05-24 MED ORDER — SODIUM CHLORIDE 0.9% FLUSH: 10.0000 mL | INTRAVENOUS | Status: DC | PRN

## 2017-05-24 MED ORDER — MORPHINE SULFATE (PF) 4 MG/ML IV SOLN
2.0000 mg | INTRAVENOUS | Status: DC | PRN
  Administered 2017-05-24: 2 mg via INTRAVENOUS
  Filled 2017-05-24: qty 1

## 2017-05-24 MED ORDER — CHLORHEXIDINE GLUCONATE 0.12% ORAL RINSE (MEDLINE KIT)
15.0000 mL | Freq: Two times a day (BID) | OROMUCOSAL | Status: DC
Start: 2017-05-24 — End: 2017-05-28
  Administered 2017-05-24 – 2017-05-28 (×8): 15 mL via OROMUCOSAL

## 2017-05-24 MED ORDER — BISACODYL 10 MG RE SUPP
10.0000 mg | RECTAL | Status: DC
  Administered 2017-05-25: 10 mg via RECTAL
  Filled 2017-05-24: qty 1

## 2017-05-24 MED ORDER — METOPROLOL TARTRATE 5 MG/5ML IV SOLN
2.5000 mg | Freq: Four times a day (QID) | INTRAVENOUS | Status: DC
  Administered 2017-05-24 – 2017-05-25 (×2): 2.5 mg via INTRAVENOUS
  Filled 2017-05-24 (×2): qty 5

## 2017-05-24 MED ORDER — ORAL CARE MOUTH RINSE
15.0000 mL | Freq: Four times a day (QID) | OROMUCOSAL | Status: DC
  Administered 2017-05-24 – 2017-05-28 (×14): 15 mL via OROMUCOSAL

## 2017-05-24 MED ORDER — ALBUMIN HUMAN 25 % IV SOLN
12.5000 g | Freq: Four times a day (QID) | INTRAVENOUS | Status: DC
  Administered 2017-05-24 – 2017-05-25 (×4): 12.5 g via INTRAVENOUS
  Filled 2017-05-24 (×4): qty 50

## 2017-05-24 MED ORDER — MIDAZOLAM HCL 2 MG/2ML IJ SOLN
INTRAMUSCULAR | Status: AC
  Filled 2017-05-24: qty 2

## 2017-05-24 NOTE — Progress Notes (Signed)
Family at bedside. Updated of pt condition and plan of care. Informed of contact precautions and the need to wear gown and gloves for protection. Family refuses to wear PPE at this time.

## 2017-05-24 NOTE — Progress Notes (Addendum)
Pt's heart rate now in the 150s sustained reaching up to the 190s even after 5 mg metoprolol given. Dr. Pearline Cables paged and updated of pt condition. Informed of current HR and BP. New order received.

## 2017-05-24 NOTE — Progress Notes (Signed)
CRITICAL VALUE ALERT  Critical Value:  Lactic = 2.9, Troponin = 0.27   Date & Time Notied:  05/24/2017 1600  Provider Notified: Dr. Pearline Cables  Orders Received/Actions taken: MD at bedside to intubate

## 2017-05-24 NOTE — Progress Notes (Signed)
Pt, sob paged doctor cain, he came to the room to see the pt. Gave orders and ask that the pt. Be transferred

## 2017-05-24 NOTE — Progress Notes (Signed)
Pt intubated by Dr Pearline Cables. 7.5 20 cm at the lip. Confirmed by CO2 detector, awaiting CXR.

## 2017-05-24 NOTE — Brief Op Note (Signed)
Central Line Placement  A central line was placed for pressor infusion and mixed venous gas monitoring. Informed consent obtained, time out performed. Sterile prep over left subclavian with chlorhexidine. Broadly draped and sterile garb donned. Left subclavian cannulated and a wire gently placed. The tract was dilated and a 7FR 20 cm triple lumen cather placed over the wire to 18 cm and sutured in place. Good flow from all ports. Sterile dressing applied. CXR shows good placement, no ptx  Lars Masson, MD

## 2017-05-24 NOTE — Progress Notes (Signed)
Pt now in A-fib with RVR with rate in 150-170s. Cardizem gtt started. Pt continues to be tachypneic with increased work of breathing. Dr. Pearline Cables paged and updated of pt condition. New orders received. Per MD he will see pt.

## 2017-05-24 NOTE — Progress Notes (Signed)
Dr Pearline Cables at bedside to intubate. Pt BPs in 50's. Cardizem stopped.

## 2017-05-24 NOTE — Progress Notes (Signed)
Gave pt. 40mg . Of IV furosemide and lopressor iv per doctor cainn, gave report to 2h nurse and transfer the pt. To 2h05

## 2017-05-24 NOTE — Progress Notes (Signed)
PHARMACIST - PHYSICIAN COMMUNICATION  CONCERNING:  METFORMIN SAFE ADMINISTRATION POLICY  RECOMMENDATION: Metformin has been placed on DISCONTINUE (rejected order) STATUS and should be reordered only after any of the conditions below are ruled out.  Current Safety recommendations include avoiding metformin for a minimum of 48 hours after the patient's exposure to intravenous contrast media for the following conditions:  . eGFR < 60 ml/min  . Liver disease, alcoholism, heart failure, intra-arterial administration of contrast  DESCRIPTION:  The Pharmacy Committee has adopted a policy that restricts the use of metformin in hospitalized patients until all the contraindications to administration have been ruled out. Specific contraindications are: []  Serum creatinine ? 1.5 for males []  Serum creatinine ? 1.4 for females []  Shock, acute MI, sepsis, hypoxemia, dehydration []  Planned administration of intravenous iodinated contrast media when eGFR < 70m/min, Liver Disease, alcoholism, heart failure or intra-arterial administration of contrast []  Heart Failure patients with low EF [x]  Acute or chronic metabolic acidosis (including DKA)   [name] [name] Center8/08/2017 10:39 PM

## 2017-05-24 NOTE — Progress Notes (Signed)
[facility] Progress Note Patient Name: Brooke Keller DOB: 10/11/1935 MRN: [id number]   Date of Service  05/24/2017  HPI/Events of Note  Multiple issues: 1. Troponin = 0.27 --> 0.31. Likely demand ischemia. Patient was in AFIB with RVR (ventricular rate = 160). Now AFIB with controlled rate = 87 to 93. Patient on Norepinephrine IV infusion, therefore, can't use B-Blocker, 2. Lactic Acid = 2.9 --> 2.3 and 3. Request for Q 4 hours Novolog SSI.  eICU Interventions  Will order: 1. Change ASA to suppository 150 mg PR now and Q day. 2. Continue to trend Troponin.  3. Continue to trend Lactic Acid. 4. Q 4 hour sensitive Novolog SSI.     Intervention Category Major Interventions: Hyperglycemia - active titration of insulin therapy Intermediate Interventions: Diagnostic test evaluation  Karli Wickizer Eugene 05/24/2017, 9:11 PM

## 2017-05-24 NOTE — Op Note (Signed)
Endotrachael intubation.  The patient was intubated for respiratory distress despite bipap. After discussion with family and a time out, the patient was properly positioned and pre-oxygenated. A class 1 airway was confirmed then 16 mg Etomidate and 60 mg Rocuronium given. There was easy visualization with a 3 Miller and a 7.5 ETT was gently passed to 20 cm. Positive CO2. CXR shows good placement.   Lars Masson, MD

## 2017-05-24 NOTE — Progress Notes (Signed)
PULMONARY / CRITICAL CARE MEDICINE   Name: Brooke Keller MRN: [id number] DOB: 1935-01-23    ADMISSION DATE:  05/20/2017 CONSULTATION DATE: 05/24/2017  REFERRING MD: Geronimo Running  CHIEF COMPLAINT: Dyspnea 4 days S/P axillary/bifem bypass  HISTORY OF PRESENT ILLNESS:   This is an 82 year old with type II DM, O2 dependent COPD, CHF, HTN and IgG multiple myeloma who underwent an uncomplicated surgery 4 days ago due to a non-healing ulcer of the ankle. Yesterday she developed some abdominal distension and but refused an NG tube. Overnight she was somewhat disoriented and this morning hypoxic and distressed. She denies cough or any sort of chest pain. Onset was gradual. On the floor she was found to be in  atrial fibrillation with a rapid ventricular response and hypertensive. She was given a dose of metoprolol and supplemental lasix and transferred to ICU. A radionuclide stress test done earlier this year showed a fixed apical defect and an Ef = 77%. She is 2.7 liters positive over the last 48 hours    PAST MEDICAL HISTORY :  She  has a past medical history of Anemia; CHF (congestive heart failure) ([place]); COPD (chronic obstructive pulmonary disease) ([place]); Diabetes mellitus; GERD (gastroesophageal reflux disease); Hypertension; IgG multiple myeloma ([place]) (04/14/2016); Multiple myeloma; Peripheral vascular disease ([place]); and Supplemental oxygen dependent.  PAST SURGICAL HISTORY: She  has a past surgical history that includes carpal tunne; right fifth toe; Kyphoplasty; ABDOMINAL AORTOGRAM W/LOWER EXTREMITY (N/A, 03/24/2017); Portacath placement; and Axillary-femoral Bypass Graft (Right, 05/20/2017).  Allergies  Allergen Reactions  . Ativan [Lorazepam] Other (See Comments)    hallucinations  . Amitriptyline Other (See Comments)    HALLUCINATIONS    No current facility-administered medications on file prior to encounter.    Current Outpatient Prescriptions on File Prior to Encounter  Medication Sig   . ALPRAZolam (XANAX) 1 MG tablet Take 1 tablet (1 mg total) by mouth 2 (two) times daily as needed for anxiety.  Marland Kitchen aspirin EC 81 MG tablet Take 81 mg by mouth daily.   Marland Kitchen atorvastatin (LIPITOR) 10 MG tablet Take 10 mg by mouth daily.   . brimonidine (ALPHAGAN) 0.2 % ophthalmic solution INSTILL ONE DROP IN [facility] EYE THREE TIMES DAILY  . Calcium Carbonate-Vitamin D 600-400 MG-UNIT tablet Take 1 tablet by mouth 2 (two) times daily.  . dorzolamide-timolol (COSOPT) 22.3-6.8 MG/ML ophthalmic solution Place 1 drop into both eyes 2 (two) times daily.  . fluticasone (FLONASE) 50 MCG/ACT nasal spray Place 2 sprays into both nostrils daily as needed for rhinitis (DRYNESS).   . furosemide (LASIX) 40 MG tablet Take 40 mg by mouth twice daily  . HYDROcodone-acetaminophen (NORCO) 10-325 MG tablet Take 1 tablet by mouth every 4 (four) hours as needed. for pain  . ipratropium-albuterol (DUONEB) 0.5-2.5 (3) MG/3ML SOLN Take 3 mLs by nebulization 2 (two) times daily.    Marland Kitchen lenalidomide (REVLIMID) 10 MG capsule Take 1 capsule (10 mg total) by mouth every other day.  Marland Kitchen LUMIGAN 0.01 % SOLN INSTILL ONE DROP IN [facility] EYE AT BEDTIME  . metFORMIN (GLUCOPHAGE) 500 MG tablet Take 500 mg by mouth daily with breakfast.  . Multiple Vitamins-Minerals (MULTIVITAMIN ADULTS 50+ PO) TAKE ONE TABLET BY MOUTH DAILY  . polyethylene glycol powder (GLYCOLAX/MIRALAX) powder Take 1 Container by mouth daily as needed for mild constipation.   . potassium chloride SA (K-DUR,KLOR-CON) 20 MEQ tablet Take 20 mEq by mouth 2 (two) times daily.  . predniSONE (DELTASONE) 10 MG tablet Take 10 mg by  mouth daily.   Marland Kitchen nystatin cream (MYCOSTATIN) APPLY TO THE AFFECTED AREA  DAILY AS NEEDED IRRITATION    FAMILY HISTORY:  Her indicated that her mother is deceased. She indicated that her father is deceased. She indicated that all of her three sisters are alive. She indicated that only one of her four brothers is alive.    SOCIAL HISTORY: She  reports  that she quit smoking about 9 years ago. She quit smokeless tobacco use about 10 years ago. She reports that she does not drink alcohol or use drugs.  REVIEW OF SYSTEMS:   No abdominal pain  SUBJECTIVE:  As related in HPI  VITAL SIGNS: BP 131/72   Pulse 100   Temp 97.8 F (36.6 C) (Oral)   Resp (!) 35   Ht 4' 9"  (1.448 m)   Wt 140 lb (63.5 kg)   SpO2 95%   BMI 30.30 kg/m   HEMODYNAMICS:    VENTILATOR SETTINGS: Vent Mode: BIPAP FiO2 (%):  [100 %] 100 % Set Rate:  [15 bmp] 15 bmp  INTAKE / OUTPUT: I/O last 3 completed shifts: In: 1969.7 [P.O.:120; I.V.:550; Blood:1299.7] Out: 200 [Urine:200]  PHYSICAL EXAMINATION: General:  Chronically ill appearing, tachypneic but not profoundly distressed. Able to speak in sentences for me Cardiovascular:  No JVD, trace anasarca. S1 and S2 irreg irreg. No m g or rub Lungs:  Rapid but unlabored, symmetric ai movement, scattered rhonchi, dpendent rales bilaterally. Abdomen: Somewhat distended but non-tender, with active bowel sounds, no guarding or rebound Musculoskeletal:  Toes are slightly dusky symmetrically with symmetric refill  LABS:  BMET  Recent Labs Lab 05/20/17 2146 05/21/17 0436 05/24/17 0224  NA  --  137 136  K  --  4.0 4.0  CL  --  105 101  CO2  --  23 23  BUN  --  12 19  CREATININE 1.30* 1.26* 1.25*  GLUCOSE  --  179* 171*    Electrolytes  Recent Labs Lab 05/21/17 0436 05/24/17 0224  CALCIUM 7.8* 8.8*    CBC  Recent Labs Lab 05/20/17 2146 05/21/17 0436 05/24/17 0224  WBC 4.8 4.3 4.7  HGB 7.5* 7.8* 11.4*  HCT 24.1* 25.2* 34.3*  PLT 136* 163 182    Coag's No results for input(s): APTT, INR in the last 168 hours.  Sepsis Markers No results for input(s): LATICACIDVEN, PROCALCITON, O2SATVEN in the last 168 hours.  ABG  Recent Labs Lab 05/24/17 0920  PHART 7.358  PCO2ART 36.8  PO2ART 50.7*    Liver Enzymes No results for input(s): AST, ALT, ALKPHOS, BILITOT, ALBUMIN in the last 168  hours.  Cardiac Enzymes No results for input(s): TROPONINI, PROBNP in the last 168 hours.  Glucose  Recent Labs Lab 05/22/17 2147 05/23/17 0635 05/23/17 1133 05/23/17 1658 05/23/17 2056 05/24/17 0612  GLUCAP 153* 133* 146* 163* 149* 171*    Imaging No results found.   STUDIES:  pCXR pending     ASSESSMENT / PLAN: 1. Dyspnea. Based on exam and history exacerbation of diastolic CHF caused by fluid overload and a-fib appear to be the reason for her decompensation. Controlling rate with lopressor and supplementing lasix. Awaiting CXR to confirm clinical impression. She has adequately prophylaxed with lovenox,and although she is high risk due to her treatment with Linolidomide, clinical picture is not one of embolization. Awaiting EKG and enzymes.    2. CKD. She has myeloma and received a recent dye load. Avoiding all nephrotoxins including NSAIS's in the hopes ofpreserving renal function.  3. Ileus. Very much doubt a vascular provocation. Adding methylnaltrenone to counter effects of opiates, suppository to stimulate peristalsis, and  Ensuring normal electrolytes 4. DM II  Continue current regimine    Sampson Goon MD [facility] [place] Pager: [phone]  05/24/2017, 9:57 AM   Greater than 35 minutes was spent in the care of this patient today

## 2017-05-24 NOTE — Progress Notes (Addendum)
  Progress Note    05/24/2017 8:59 AM 4 Days Post-Op  Subjective:  Pt on CPAP; opens eyes to commands and follows direction  Tm 100 now afebrile HR 80's-160's  676'H-209'O systolic 70% CPAP  Vitals:   05/24/17 0800 05/24/17 0836  BP: (!) 164/77   Pulse: (!) 117   Resp: (!) 30   Temp: 97.8 F (36.6 C)   SpO2: (!) 77% (!) 84%    Physical Exam: Cardiac:  tachycardic Lungs:  On CPAP Abdomen:  Distended; vomited last pm  CBC    Component Value Date/Time   WBC 4.7 05/24/2017 0224   RBC 3.92 05/24/2017 0224   HGB 11.4 (L) 05/24/2017 0224   HCT 34.3 (L) 05/24/2017 0224   PLT 182 05/24/2017 0224   MCV 87.5 05/24/2017 0224   MCH 29.1 05/24/2017 0224   MCHC 33.2 05/24/2017 0224   RDW 16.2 (H) 05/24/2017 0224   LYMPHSABS 0.5 (L) 01/15/2017 1359   MONOABS 0.3 01/15/2017 1359   EOSABS 0.1 01/15/2017 1359   BASOSABS 0.0 01/15/2017 1359    BMET    Component Value Date/Time   NA 136 05/24/2017 0224   K 4.0 05/24/2017 0224   CL 101 05/24/2017 0224   CO2 23 05/24/2017 0224   GLUCOSE 171 (H) 05/24/2017 0224   BUN 19 05/24/2017 0224   CREATININE 1.25 (H) 05/24/2017 0224   CALCIUM 8.8 (L) 05/24/2017 0224   GFRNONAA 40 (L) 05/24/2017 0224   GFRAA 46 (L) 05/24/2017 0224    INR    Component Value Date/Time   INR 1.06 05/07/2017 0944     Intake/Output Summary (Last 24 hours) at 05/24/17 0859 Last data filed at 05/23/17 2030  Gross per 24 hour  Intake          1299.67 ml  Output                0 ml  Net          1299.67 ml     Assessment:  82 y.o. female is s/p:  Right axillobifemoral bypass graft  4 Days Post-Op  Plan: -pt in distress this am with decreased oxygen saturations.  Pt give Lasix IV 40mg  x 1 dose -stat ABG, PCXR, KUB ordered -tachycardic at 160-will give IV metoprolol -abdominal distenstion-place NGT; continue npo -transfer to ICU-consulted Dr. Pearline Cables with CCM    Leontine Locket, PA-C [facility] [phone] 05/24/2017 8:59 AM    I have interviewed and examined patient with PA and agree with assessment and plan above. Transfer to icu and ccm consulted. kub demonstrates large stomach and will need ng tube.   Daxen Lanum C. Donzetta Matters, MD [facility] [place] Office: [phone] Pager: [phone]

## 2017-05-24 NOTE — Progress Notes (Signed)
VASCULAR LAB PRELIMINARY  PRELIMINARY  PRELIMINARY  PRELIMINARY  Bilateral lower extremity venous duplex completed.    Preliminary report:  There is no obvious evidence of acute DVT or SVT noted in the bilateral lower extremities.   Nicola Quesnell, RVT 05/24/2017, 6:01 PM

## 2017-05-24 NOTE — Progress Notes (Signed)
[place] Progress Note Patient Name: MORA PEDRAZA DOB: [birth year] MRN: [id number]   Date of Service  05/24/2017  HPI/Events of Note  ABG on 100%/PRVC 20/TV 640/P 7 = 7.20/66/92/29  eICU Interventions  Will order: 1. Increase PRVC rate to 30. 2. ABG at 11:30 PM.      Intervention Category Major Interventions: Acid-Base disturbance - evaluation and management;Respiratory failure - evaluation and management  Carah Barrientes [place] 05/24/2017, 10:20 PM

## 2017-05-24 NOTE — Significant Event (Signed)
Rapid Response Event Note  Overview: Time Called: 0836 Arrival Time: 0840 Event Type: Respiratory  Initial Focused Assessment: Patient in acute respiratory distress, increased work of breathing and dusky. Patient is alert and asking for water. Upon my arrival patient on home device "Trilogy" with additional O2. BP 164/77  RAF 160  RR 36  O2 sat 84% Lung sounds with rhonchi  Interventions: ABG done 12 lead EKG done 5mg  Lopressor given IV 40mg  Lasix given IV  O2 sats 75-85% on "Trilogy"  PO2 51 Placed on 100% NRB and additional 6l [place]  O2 sats 81-85%  Hr improved to 90s RR remains mid to upper 30s,  labored  Transported to 2h05 via bed with O2 and heart monitor  RT at bedside, placed patient on Bipap Handoff to [place] (if not transferred):  Event Summary: Name of Physician Notified: Karlton Lemon PA at bedside at [place]  Name of Consulting Physician Notified: Dr Eliberto Ivory at 0900  Outcome: Transferred (Comment) [phone])  Event End Time: 0945  Raliegh Ip

## 2017-05-25 ENCOUNTER — Inpatient Hospital Stay (HOSPITAL_COMMUNITY): Payer: Medicare Other

## 2017-05-25 DIAGNOSIS — J9621 Acute and chronic respiratory failure with hypoxia: Secondary | ICD-10-CM

## 2017-05-25 LAB — COMPREHENSIVE METABOLIC PANEL
ALT: 20 U/L (ref 14–54)
ANION GAP: 9 (ref 5–15)
AST: 37 U/L (ref 15–41)
Albumin: 2.4 g/dL — ABNORMAL LOW (ref 3.5–5.0)
Alkaline Phosphatase: 54 U/L (ref 38–126)
BUN: 34 mg/dL — ABNORMAL HIGH (ref 6–20)
CHLORIDE: 99 mmol/L — AB (ref 101–111)
CO2: 25 mmol/L (ref 22–32)
Calcium: 8.3 mg/dL — ABNORMAL LOW (ref 8.9–10.3)
Creatinine, Ser: 1.91 mg/dL — ABNORMAL HIGH (ref 0.44–1.00)
GFR, EST AFRICAN AMERICAN: 27 mL/min — AB (ref >60)
GFR, EST NON AFRICAN AMERICAN: 24 mL/min — AB (ref >60)
Glucose, Bld: 151 mg/dL — ABNORMAL HIGH (ref 65–99)
POTASSIUM: 3.6 mmol/L (ref 3.5–5.1)
Sodium: 133 mmol/L — ABNORMAL LOW (ref 135–145)
TOTAL PROTEIN: 5.4 g/dL — AB (ref 6.5–8.1)
Total Bilirubin: 2.3 mg/dL — ABNORMAL HIGH (ref 0.3–1.2)

## 2017-05-25 LAB — CBC WITH DIFFERENTIAL/PLATELET
BASOS ABS: 0 10*3/uL (ref 0.0–0.1)
Basophils Relative: 0 %
Eosinophils Absolute: 0.3 10*3/uL (ref 0.0–0.7)
Eosinophils Relative: 3 %
HCT: 29.1 % — ABNORMAL LOW (ref 36.0–46.0)
Hemoglobin: 9.3 g/dL — ABNORMAL LOW (ref 12.0–15.0)
LYMPHS PCT: 9 %
Lymphs Abs: 0.8 10*3/uL (ref 0.7–4.0)
MCH: 28.4 pg (ref 26.0–34.0)
MCHC: 32 g/dL (ref 30.0–36.0)
MCV: 88.7 fL (ref 78.0–100.0)
MONOS PCT: 6 %
Monocytes Absolute: 0.5 10*3/uL (ref 0.1–1.0)
Neutro Abs: 7.2 10*3/uL (ref 1.7–7.7)
Neutrophils Relative %: 82 %
PLATELETS: 180 10*3/uL (ref 150–400)
RBC: 3.28 MIL/uL — AB (ref 3.87–5.11)
RDW: 16.6 % — AB (ref 11.5–15.5)
WBC: 8.8 10*3/uL (ref 4.0–10.5)

## 2017-05-25 LAB — URINALYSIS, ROUTINE W REFLEX MICROSCOPIC
Bilirubin Urine: NEGATIVE
Glucose, UA: 50 mg/dL — AB
Ketones, ur: NEGATIVE mg/dL
Leukocytes, UA: NEGATIVE
Nitrite: NEGATIVE
PH: 5 (ref 5.0–8.0)
Protein, ur: 30 mg/dL — AB
SPECIFIC GRAVITY, URINE: 1.013 (ref 1.005–1.030)

## 2017-05-25 LAB — BASIC METABOLIC PANEL
Anion gap: 12 (ref 5–15)
BUN: 29 mg/dL — AB (ref 6–20)
CHLORIDE: 99 mmol/L — AB (ref 101–111)
CO2: 23 mmol/L (ref 22–32)
Calcium: 8 mg/dL — ABNORMAL LOW (ref 8.9–10.3)
Creatinine, Ser: 1.71 mg/dL — ABNORMAL HIGH (ref 0.44–1.00)
GFR calc Af Amer: 31 mL/min — ABNORMAL LOW (ref >60)
GFR calc non Af Amer: 27 mL/min — ABNORMAL LOW (ref >60)
GLUCOSE: 211 mg/dL — AB (ref 65–99)
POTASSIUM: 3.3 mmol/L — AB (ref 3.5–5.1)
SODIUM: 134 mmol/L — AB (ref 135–145)

## 2017-05-25 LAB — GLUCOSE, CAPILLARY
GLUCOSE-CAPILLARY: 170 mg/dL — AB (ref 65–99)
GLUCOSE-CAPILLARY: 163 mg/dL — AB (ref 65–99)
Glucose-Capillary: 153 mg/dL — ABNORMAL HIGH (ref 65–99)
Glucose-Capillary: 164 mg/dL — ABNORMAL HIGH (ref 65–99)

## 2017-05-25 LAB — POCT I-STAT 3, ART BLOOD GAS (G3+)
ACID-BASE DEFICIT: 2 mmol/L (ref 0.0–2.0)
Bicarbonate: 23 mmol/L (ref 20.0–28.0)
O2 SAT: 100 %
PH ART: 7.374 (ref 7.350–7.450)
PO2 ART: 181 mmHg — AB (ref 83.0–108.0)
TCO2: 24 mmol/L (ref 0–100)
pCO2 arterial: 40 mmHg (ref 32.0–48.0)

## 2017-05-25 LAB — CORTISOL

## 2017-05-25 LAB — PHOSPHORUS: PHOSPHORUS: 3.6 mg/dL (ref 2.5–4.6)

## 2017-05-25 LAB — MAGNESIUM
MAGNESIUM: 2 mg/dL (ref 1.7–2.4)
Magnesium: 1.6 mg/dL — ABNORMAL LOW (ref 1.7–2.4)

## 2017-05-25 LAB — BRAIN NATRIURETIC PEPTIDE: B NATRIURETIC PEPTIDE 5: 908.6 pg/mL — AB (ref 0.0–100.0)

## 2017-05-25 LAB — TROPONIN I: Troponin I: 0.33 ng/mL (ref <0.03)

## 2017-05-25 LAB — PROCALCITONIN: PROCALCITONIN: 36.39 ng/mL

## 2017-05-25 MED ORDER — AMIODARONE LOAD VIA INFUSION
150.0000 mg | Freq: Once | INTRAVENOUS | Status: AC
  Administered 2017-05-25: 150 mg via INTRAVENOUS
  Filled 2017-05-25: qty 83.34

## 2017-05-25 MED ORDER — POTASSIUM CHLORIDE 10 MEQ/50ML IV SOLN
10.0000 meq | INTRAVENOUS | Status: AC
  Administered 2017-05-25 – 2017-05-26 (×4): 10 meq via INTRAVENOUS
  Filled 2017-05-25 (×4): qty 50

## 2017-05-25 MED ORDER — PANTOPRAZOLE SODIUM 40 MG IV SOLR
40.0000 mg | Freq: Every day | INTRAVENOUS | Status: DC
  Administered 2017-05-25 – 2017-06-04 (×11): 40 mg via INTRAVENOUS
  Filled 2017-05-25 (×11): qty 40

## 2017-05-25 MED ORDER — PIPERACILLIN-TAZOBACTAM IN DEX 2-0.25 GM/50ML IV SOLN
2.2500 g | Freq: Three times a day (TID) | INTRAVENOUS | Status: DC
  Administered 2017-05-25 – 2017-05-26 (×3): 2.25 g via INTRAVENOUS
  Filled 2017-05-25 (×4): qty 50

## 2017-05-25 MED ORDER — MAGNESIUM SULFATE 2 GM/50ML IV SOLN
2.0000 g | Freq: Once | INTRAVENOUS | Status: AC
  Administered 2017-05-25: 2 g via INTRAVENOUS
  Filled 2017-05-25: qty 50

## 2017-05-25 MED ORDER — HYDROCORTISONE NA SUCCINATE PF 100 MG IJ SOLR
50.0000 mg | Freq: Four times a day (QID) | INTRAMUSCULAR | Status: DC
  Administered 2017-05-25 – 2017-05-28 (×12): 50 mg via INTRAVENOUS
  Filled 2017-05-25 (×12): qty 2

## 2017-05-25 MED ORDER — AMIODARONE HCL IN DEXTROSE 360-4.14 MG/200ML-% IV SOLN
30.0000 mg/h | INTRAVENOUS | Status: DC
  Administered 2017-05-26 – 2017-05-28 (×5): 30 mg/h via INTRAVENOUS
  Filled 2017-05-25 (×6): qty 200

## 2017-05-25 MED ORDER — HEPARIN SODIUM (PORCINE) 5000 UNIT/ML IJ SOLN
5000.0000 [IU] | Freq: Three times a day (TID) | INTRAMUSCULAR | Status: DC
  Administered 2017-05-26 – 2017-06-10 (×46): 5000 [IU] via SUBCUTANEOUS
  Filled 2017-05-25 (×48): qty 1

## 2017-05-25 MED ORDER — VASOPRESSIN 20 UNIT/ML IV SOLN
0.0300 [IU]/min | INTRAVENOUS | Status: DC
  Administered 2017-05-25 – 2017-05-26 (×3): 0.03 [IU]/min via INTRAVENOUS
  Filled 2017-05-25 (×3): qty 2

## 2017-05-25 MED ORDER — POTASSIUM CHLORIDE 20 MEQ/15ML (10%) PO SOLN
40.0000 meq | Freq: Once | ORAL | Status: AC
  Administered 2017-05-25: 40 meq
  Filled 2017-05-25: qty 30

## 2017-05-25 MED ORDER — AMIODARONE HCL IN DEXTROSE 360-4.14 MG/200ML-% IV SOLN
60.0000 mg/h | INTRAVENOUS | Status: AC
  Administered 2017-05-25 (×2): 60 mg/h via INTRAVENOUS
  Filled 2017-05-25: qty 200

## 2017-05-25 MED ORDER — IPRATROPIUM-ALBUTEROL 0.5-2.5 (3) MG/3ML IN SOLN
3.0000 mL | Freq: Four times a day (QID) | RESPIRATORY_TRACT | Status: DC
  Administered 2017-05-25 – 2017-06-02 (×34): 3 mL via RESPIRATORY_TRACT
  Filled 2017-05-25 (×35): qty 3

## 2017-05-25 MED ORDER — METOPROLOL TARTRATE 5 MG/5ML IV SOLN
2.5000 mg | Freq: Four times a day (QID) | INTRAVENOUS | Status: DC | PRN
  Administered 2017-05-25 – 2017-05-30 (×3): 2.5 mg via INTRAVENOUS
  Filled 2017-05-25 (×3): qty 5

## 2017-05-25 NOTE — Progress Notes (Signed)
[place] Progress Note Patient Name: IKRAM RIEBE DOB: 02/15/1935 MRN: [id number]   Date of Service  05/25/2017  HPI/Events of Note  K+ = 3.3, Mg++ = 2.0  and Creatinine = 1.71.  eICU Interventions  Will order: 1. Will replace K+.     Intervention Category Major Interventions: Electrolyte abnormality - evaluation and management  Johnie Stadel [place] 05/25/2017, 10:30 PM

## 2017-05-25 NOTE — Progress Notes (Signed)
PULMONARY / CRITICAL CARE MEDICINE   Name: ABI SHOULTS MRN: [id number] DOB: 1934/12/22    ADMISSION DATE:  05/20/2017 CONSULTATION DATE: 05/24/2017  REFERRING MD: Geronimo Running    HISTORY OF PRESENT ILLNESS:   This is an 82 year old with type II DM, O2 dependent COPD, CHF, HTN and IgG multiple myeloma who underwent an uncomplicated surgery 4 days ago due to a non-healing ulcer of the ankle. Yesterday she developed some abdominal distension and but refused an NG tube. Overnight she was somewhat disoriented and this morning hypoxic and distressed. She denies cough or any sort of chest pain. Onset was gradual. On the floor she was found to be in  atrial fibrillation with a rapid ventricular response and hypertensive. She was given a dose of metoprolol and supplemental lasix and transferred to ICU. A radionuclide stress test done earlier this year showed a fixed apical defect and an Ef = 77%. She is 2.7 liters    SUBJECTIVE:  Resting on vent . Follows commands on precedex.  Remains on dual pressor support with levo/vaso.  Slightly improved UOP.    VITAL SIGNS: BP (!) 136/52   Pulse 73   Temp 99.3 F (37.4 C) (Oral)   Resp (!) 31   Ht 4' 9"  (1.448 m)   Wt 134 lb 7.7 oz (61 kg)   SpO2 100%   BMI 29.10 kg/m   HEMODYNAMICS: CVP:  [10 mmHg-14 mmHg] 10 mmHg  VENTILATOR SETTINGS: Vent Mode: PRVC FiO2 (%):  [80 %-100 %] 80 % Set Rate:  [15 bmp-30 bmp] 30 bmp Vt Set:  [340 mL] 340 mL PEEP:  [7 cmH20] 7 cmH20 Plateau Pressure:  [18 cmH20-19 cmH20] 19 cmH20  INTAKE / OUTPUT: I/O last 3 completed shifts: In: 3672.8 [I.V.:2131.1; Blood:361.7; NG/GT:30; IV Piggyback:1150] Out: 409 [Urine:685; Emesis/NG output:300]  PHYSICAL EXAMINATION: GEN: Sedated on vent , critically ill appearing    HEENT:   Normocephalic/Atraumatic , ETT , NGT to LIWS  NECK:  no JVD;  ; no lymphadenopathy.    RESP  BB coarse Crackles   CARD:  SR , tr peripheral edema.   GI:   Decreased BS w/ distention  noted, NGT.   Neuro: Sedated , MAE .    Skin: Bilateral ankle dressings    LABS:  BMET  Recent Labs Lab 05/24/17 0224 05/24/17 1430 05/25/17 0343  NA 136 135 133*  K 4.0 4.2 3.6  CL 101 100* 99*  CO2 23 24 25   BUN 19 29* 34*  CREATININE 1.25* 1.45* 1.91*  GLUCOSE 171* 188* 151*    Electrolytes  Recent Labs Lab 05/24/17 0224 05/24/17 1430 05/25/17 0343  CALCIUM 8.8* 8.5* 8.3*  MG  --   --  1.6*  PHOS  --   --  3.6    CBC  Recent Labs Lab 05/24/17 0224 05/24/17 1842 05/25/17 0343  WBC 4.7 12.5* 8.8  HGB 11.4* 10.8* 9.3*  HCT 34.3* 32.9* 29.1*  PLT 182 232 180    Coag's No results for input(s): APTT, INR in the last 168 hours.  Sepsis Markers  Recent Labs Lab 05/24/17 1430 05/24/17 1842 05/24/17 2107 05/25/17 0343  LATICACIDVEN 2.9* 2.3* 2.2*  --   PROCALCITON 6.92  --   --  36.39    ABG  Recent Labs Lab 05/24/17 0920 05/24/17 1735 05/24/17 2315  PHART 7.358 7.209* 7.294*  PCO2ART 36.8 66.8* 50.4*  PO2ART 50.7* 92.0 128*    Liver Enzymes  Recent Labs Lab 05/25/17 0343  AST 37  ALT 20  ALKPHOS 54  BILITOT 2.3*  ALBUMIN 2.4*    Cardiac Enzymes  Recent Labs Lab 05/24/17 1242 05/24/17 1842 05/24/17 2330  TROPONINI 0.27* 0.31* 0.33*    Glucose  Recent Labs Lab 05/24/17 0612 05/24/17 1256 05/24/17 1841 05/24/17 1959 05/24/17 2338 05/25/17 0340  GLUCAP 171* 151* 204* 235* 198* 153*    Imaging Dg Abd 1 View  Result Date: 05/24/2017 CLINICAL DATA:  Check nasogastric catheter placement EXAM: ABDOMEN - 1 VIEW COMPARISON:  05/24/2017 FINDINGS: Nasogastric catheter now lies within the stomach. Some decrease in the degree of gastric distention is noted. Persistent small bowel dilatation is seen. Right chest wall port is again seen IMPRESSION: Nasogastric catheter within the stomach. Persistent small bowel dilatation is noted. Electronically Signed   By: Inez Catalina M.D.   On: 05/24/2017 14:14   Ct Chest Wo  Contrast  Result Date: 05/25/2017 CLINICAL DATA:  Acute onset of respiratory failure. Sepsis. Initial encounter. EXAM: CT CHEST WITHOUT CONTRAST TECHNIQUE: Multidetector CT imaging of the chest was performed following the standard protocol without IV contrast. COMPARISON:  Chest radiograph performed earlier today at 4:34 p.m., and CT of the chest performed 11/26/2013 FINDINGS: Cardiovascular: Diffuse calcification is noted along the thoracic aorta and proximal great vessels. The heart remains normal in size. Diffuse coronary artery calcifications are seen. Mediastinum/Nodes: Visualized mediastinal nodes remain normal in size. No pericardial effusion is identified. The thyroid gland is unremarkable. No axillary lymphadenopathy is seen. The patient's endotracheal tube is seen ending 4-5 cm above the carina. The enteric tube extends into the stomach. A right-sided chest port and a left subclavian line are noted. Lungs/Pleura: Trace right-sided pleural fluid is noted. There is dense consolidation of the right lower lobe, and patchy airspace opacities are noted within the remainder of the right lung and left lower lobe, compatible with multifocal pneumonia. No pneumothorax is seen. Upper Abdomen: A 1.2 cm hypodensity is noted at the right hepatic lobe. The visualized portions of the liver and spleen are otherwise unremarkable. The visualized portions of the pancreas, adrenal glands and kidneys are within normal limits. Nonspecific perinephric stranding is noted bilaterally. There is aneurysmal dilatation of the infrarenal abdominal aorta to 3.3 cm in AP dimension. Diffuse calcification is seen along the proximal abdominal aorta. Musculoskeletal: No acute osseous abnormalities are identified. Chronic compression deformities are noted at T4, T5, T6, T7, T10, and L1. The patient is status post vertebroplasty at T6 and T7. Chronic left-sided rib deformities are noted. The visualized musculature is unremarkable in  appearance. IMPRESSION: 1. Dense consolidation of the right lower lobe, and patchy airspace opacities within the remainder of the right lung and left lower lobe, compatible with multifocal pneumonia. 2. Trace right-sided pleural fluid noted. 3. Diffuse coronary artery calcifications noted. 4. **An incidental finding of potential clinical significance has been found. Aneurysmal dilatation of the infrarenal abdominal aorta to 3.3 cm in AP dimension. Recommend followup by ultrasound in 3 years. This recommendation follows ACR consensus guidelines: White Paper of the ACR Incidental Findings Committee II on Vascular Findings. J Am Coll Radiol 2013; 59:163-846** 5. Diffuse aortic atherosclerosis. 6. 1.2 cm nonspecific hypodensity at the right hepatic lobe. 7. Chronic compression deformities at T4, T5, T6, T7, T10 and L1. Electronically Signed   By: Garald Balding M.D.   On: 05/25/2017 00:22   Dg Chest Port 1 View  Result Date: 05/25/2017 CLINICAL DATA:  Endotracheal tube, history COPD, GERD, multiple myeloma, CHF, type II diabetes mellitus EXAM:  PORTABLE CHEST 1 VIEW COMPARISON:  Portable exam 0160 hours compared to 05/24/2017 FINDINGS: Endotracheal tube, nasogastric tube and RIGHT jugular Port-A-Cath unchanged. Rotated to the RIGHT. Stable heart size mediastinal contours. Persistent RIGHT basilar atelectasis versus consolidation unchanged. Minimal persistent accentuation of markings at LEFT base without segmental consolidation. No pleural effusion or pneumothorax. Bones demineralized. IMPRESSION: Persistent RIGHT lower lobe atelectasis versus consolidation. Electronically Signed   By: Lavonia Dana M.D.   On: 05/25/2017 07:21   Dg Chest Port 1 View  Result Date: 05/24/2017 CLINICAL DATA:  Intubated EXAM: PORTABLE CHEST 1 VIEW COMPARISON:  Chest radiograph from earlier today. FINDINGS: Endotracheal tube tip is 2.9 cm above the carina. Enteric tube enters stomach with the tip not seen on this image. Left subclavian  central venous catheter terminates in the middle third of the superior vena cava. Right internal jugular Port-A-Cath terminates over the cavoatrial junction. Stable cardiomediastinal silhouette with mild cardiomegaly and aortic atherosclerosis. No pneumothorax. Stable mild blunting of the right costophrenic angle. No left pleural effusion. No overt pulmonary edema. Patchy consolidation at the medial right lung base is unchanged. IMPRESSION: 1. Well-positioned support structures.  No pneumothorax. 2. Stable patchy consolidation at the medial right lung base, cannot exclude pneumonia. 3. Stable mild blunting of the right costophrenic angle, which may represent a small right pleural effusion. Electronically Signed   By: Ilona Sorrel M.D.   On: 05/24/2017 17:12   Dg Chest Port 1 View  Result Date: 05/24/2017 CLINICAL DATA:  Labored breathing, low oxygen saturation, CHF, COPD, hypertension, type II diabetes mellitus EXAM: PORTABLE CHEST 1 VIEW COMPARISON:  Portable exam 0943 hours compared to 12/04/2016 FINDINGS: Severely rotated to the RIGHT. RIGHT jugular Port-A-Cath unchanged. Upper normal heart size. Lungs appear emphysematous but clear. No gross infiltrate, pleural effusion or pneumothorax. Mild significant osseous demineralization with prior spinal augmentation procedures at adjacent levels in the midthoracic spine. IMPRESSION: Emphysematous changes without acute infiltrate on severely rotated exam. Electronically Signed   By: Lavonia Dana M.D.   On: 05/24/2017 10:11   Dg Abd Portable 1v  Result Date: 05/25/2017 CLINICAL DATA:  Ileus, intubation EXAM: PORTABLE ABDOMEN - 1 VIEW COMPARISON:  Portable exam 0517 hours compared 05/24/2017 FINDINGS: Tip of nasogastric tube projects over gastric antrum. Air-filled minimally distended loops of small bowel consistent with history of ileus, slightly decrease in the previous exam. Paucity of colonic gas. No definite bowel wall thickening. Marked osseous  demineralization. Atherosclerotic calcification aorta. IMPRESSION: Decreased small bowel dilatation since previous study. Electronically Signed   By: Lavonia Dana M.D.   On: 05/25/2017 07:22   Dg Abd Portable 1v  Result Date: 05/24/2017 CLINICAL DATA:  Abdominal distension EXAM: PORTABLE ABDOMEN - 1 VIEW COMPARISON:  Portable exam 1114 hours compared to 05/23/2017 FINDINGS: Significant gaseous distention of stomach. Air-filled loops of small bowel in the mid abdomen. Paucity of colonic gas. Findings could reflect ileus or small bowel obstruction. Atelectasis versus infiltrate at RIGHT lung base. Bones diffusely demineralized. IMPRESSION: Marked gas distention of stomach with additional significant dilatation of small bowel loops, question ileus versus small-bowel obstruction. Electronically Signed   By: Lavonia Dana M.D.   On: 05/24/2017 11:37     STUDIES:   CT chest 8/12 Bilateral PNA , R> L  Venous Doppler 8/11 neg for DVT     ASSESSMENT / PLAN: 1. Septic Shock requiring pressor support   Plan : Titrate pressors for MAP >65 Check Cortisol level , change home prednisone to Hydrocortisone IV  Continue ABX  2. Vent dependent Respiratory Failure with Bilateral PNA   Plan  Continue Vent support  Cont Nebs  Daily SBT /eval wean  Cont on Precedex .  Check ABG this am .   3. . Bilateral PNA (:?Aspiration -vomited 8/10 with Ileus)   Plan  Cont IV abx with Zyvox and Zosyn  Tr LA/PCT   4 . A fib with RVR - resolved with cardizem drip /weaned off Cardizem 8/11.   Plan  Monitor.  Cont on Lovenox SQ DVT prophylaxis  Check BNP, if elevated check Echo.  Cont low dose metoprolol IV   5.  CKD.  Has myeloma and recent dye exposure . Avoid all nephrotoxins as able  Lasix on hold for now w/ hypotension   Plan  Lasix on hold   6. . Ileus.   S/p Relistor 8/11  Plan  NPO  Cont NGT with LIWS  Hold oral meds (Lipitor , Lasix , xanax , Vicodin ) ,   7 . COPD on home O2   Plan   Increase Pulmonary Hygiene with Duoneb Four times a day .    8. S/p Axilla/Bifemoral Bypass /Ankle wound   Plan  Per VS   9. DM II    Plan  Cont SSI   10 Hypokalemia/Hypomag  Plan  Mg/K replaced on 8/12   11. Multiple Myeloma on Revlimid   Plan  Will discuss Revlimid ongoing use during critical illness Maryclare Labrador Kizzie Ide.    Tammy Parrett NP-C  [facility]  [phone]   05/25/2017, 7:43 AM

## 2017-05-25 NOTE — Progress Notes (Signed)
[facility] Progress Note Patient Name: Brooke Keller DOB: July 04, 1935 MRN: [id number]   Date of Service  05/25/2017  HPI/Events of Note  AFIB with RVR - Ventricular Rate 120's to 140's.  eICU Interventions  Will order: 1. BMP and Mg++ level STAT. 2. Amiodarone IV load and infusion.      Intervention Category Major Interventions: Arrhythmia - evaluation and management  Sommer,Steven Eugene 05/25/2017, 6:52 PM

## 2017-05-25 NOTE — Progress Notes (Signed)
UOP <20; MD notified; will continue to monitor  Clyda Hurdle RN

## 2017-05-25 NOTE — Progress Notes (Signed)
  Progress Note    05/25/2017 9:54 AM 5 Days Post-Op  Subjective: remains intubated and sedated on pressors  Vitals:   05/25/17 0752 05/25/17 0800  BP: (!) 125/30 (!) 124/57  Pulse: 75 76  Resp: (!) 30 (!) 31  Temp:    SpO2: 100% 100%    Physical Exam: Intubated/sedated Abdomen distended but soft Groin incisions cdi Monophasic pt signals bilaterally  CBC    Component Value Date/Time   WBC 8.8 05/25/2017 0343   RBC 3.28 (L) 05/25/2017 0343   HGB 9.3 (L) 05/25/2017 0343   HCT 29.1 (L) 05/25/2017 0343   PLT 180 05/25/2017 0343   MCV 88.7 05/25/2017 0343   MCH 28.4 05/25/2017 0343   MCHC 32.0 05/25/2017 0343   RDW 16.6 (H) 05/25/2017 0343   LYMPHSABS 0.8 05/25/2017 0343   MONOABS 0.5 05/25/2017 0343   EOSABS 0.3 05/25/2017 0343   BASOSABS 0.0 05/25/2017 0343    BMET    Component Value Date/Time   NA 133 (L) 05/25/2017 0343   K 3.6 05/25/2017 0343   CL 99 (L) 05/25/2017 0343   CO2 25 05/25/2017 0343   GLUCOSE 151 (H) 05/25/2017 0343   BUN 34 (H) 05/25/2017 0343   CREATININE 1.91 (H) 05/25/2017 0343   CALCIUM 8.3 (L) 05/25/2017 0343   GFRNONAA 24 (L) 05/25/2017 0343   GFRAA 27 (L) 05/25/2017 0343    INR    Component Value Date/Time   INR 1.06 05/07/2017 0944     Intake/Output Summary (Last 24 hours) at 05/25/17 0954 Last data filed at 05/25/17 0800  Gross per 24 hour  Intake          3599.92 ml  Output             1145 ml  Net          2454.92 ml     Assessment:  82 y.o. female is s/p ax-bifem bypass. Intubated for respiratory distress yesterday now with pneumonia. Kub with resolving distension   Plan: Medical management per pccm much appreciated Ng tube to low wall suction continuous Dry dressings to groins   Kam Kushnir C. Donzetta Matters, MD [facility] Office: [phone] Pager: [phone]  05/25/2017 9:54 AM

## 2017-05-25 NOTE — Progress Notes (Signed)
[facility] Progress Note Patient Name: Brooke Keller DOB: June 26, 1935 MRN: [id number]   Date of Service  05/25/2017  HPI/Events of Note  Hypotension, levophed going up  eICU Interventions  Add vaso     Intervention Category Major Interventions: Hypotension - evaluation and management  YACOUB,WESAM 05/25/2017, 3:21 AM

## 2017-05-25 NOTE — Progress Notes (Signed)
Dr. Oletta Darter paged. Updated of pt condition. Informed of pt's rhythm now in A-fib with HR in the 120-140s even after PRN metoprolol given. New orders received.

## 2017-05-25 NOTE — Plan of Care (Signed)
Problem: Safety: Goal: Ability to remain free from injury will improve Outcome: Not Progressing [place] added  Problem: Physical Regulation: Goal: Ability to maintain clinical measurements within normal limits will improve Outcome: Not Progressing Levophed titrated up; added vaso

## 2017-05-25 NOTE — Progress Notes (Addendum)
[facility] Attending Note  ADMISSION DATE:  05/20/2017  CONSULTATION DATE: 05/24/2017  REFERRING MD:  Servando Snare, M.D. / Vascular Surgery   Presenting HPI:  82 y.o. female with history of COPD, chronic hypoxic respiratory failure, diabetes mellitus type 2, congestive heart failure, hypertension, and multiple myeloma. Patient underwent bypass surgery for nonhealing ulcer of the ankle. Patient developed subsequent abdominal distention and ileus but refused NG tube placement. Patient became more altered and hypoxic overnight with nausea and vomiting. Patient had subsequent probable aspiration. She denied any cough or chest discomfort. Patient was noted to be in atrial fibrillation with rapid ventricular response and hypertensive. Metoprolol and Lasix were administered. Patient was transferred to the ICU and placed on noninvasive positive pressure ventilation. Patient's respiratory status remained marginal at best and ultimately she was endotracheally intubated.  Subjective:  No acute events overnight. Still requiring dual vasopressor support. Somewhat improved urine output.  Review of Systems:  Unable to obtain given intubation & sedation.   Vent Mode: PRVC FiO2 (%):  [50 %-100 %] 50 % Set Rate:  [16 bmp-30 bmp] 30 bmp Vt Set:  [340 mL] 340 mL PEEP:  [7 cmH20] 7 cmH20 Plateau Pressure:  [16 cmH20-19 cmH20] 16 cmH20  Temp:  [97 F (36.1 C)-102.3 F (39.1 C)] 99.1 F (37.3 C) (08/12 1100) Pulse Rate:  [63-157] 80 (08/12 1300) Resp:  [16-36] 35 (08/12 1300) BP: (58-153)/(30-92) 125/54 (08/12 1300) SpO2:  [97 %-100 %] 100 % (08/12 1306) FiO2 (%):  [50 %-100 %] 50 % (08/12 1306) Weight:  [134 lb 7.7 oz (61 kg)] 134 lb 7.7 oz (61 kg) (08/12 0300)  General:  No family at bedside. Intubated. No distress. Integument:  Warm & dry. No rash on exposed skin. HEENT:  Moist mucus memebranes. No scleral icterus. Endotracheal tube in place. Neurological:  Pupils symmetric.  Sedated. No spontaneous movements. Musculoskeletal:  No joint effusion or erythema appreciated. Symmetric muscle bulk. Pulmonary:  Symmetric chest wall rise on ventilator. Coarse breath sounds bilaterally. Cardiovascular:  Regular rate & rhythm. No appreciable JVD. Normal S1 & S2. Telemetry:  Sinus rhythm. Abdomen:  Soft. Nondistended. Hypoactive bowel sounds.  LINES/TUBES: OETT 8/11 >>> L [place] CVL 8/11 >>> IMPLANTED PORT >>> Foley >>> OGT >>> PIV  CBC Latest Ref Rng & Units 05/25/2017 05/24/2017 05/24/2017  WBC 4.0 - 10.5 K/uL 8.8 12.5(H) 4.7  Hemoglobin 12.0 - 15.0 g/dL 9.3(L) 10.8(L) 11.4(L)  Hematocrit 36.0 - 46.0 % 29.1(L) 32.9(L) 34.3(L)  Platelets 150 - 400 K/uL 180 232 182   BMP Latest Ref Rng & Units 05/25/2017 05/24/2017 05/24/2017  Glucose 65 - 99 mg/dL 151(H) 188(H) 171(H)  BUN 6 - 20 mg/dL 34(H) 29(H) 19  Creatinine 0.44 - 1.00 mg/dL 1.91(H) 1.45(H) 1.25(H)  Sodium 135 - 145 mmol/L 133(L) 135 136  Potassium 3.5 - 5.1 mmol/L 3.6 4.2 4.0  Chloride 101 - 111 mmol/L 99(L) 100(L) 101  CO2 22 - 32 mmol/L 25 24 23   Calcium 8.9 - 10.3 mg/dL 8.3(L) 8.5(L) 8.8(L)   Hepatic Function Latest Ref Rng & Units 05/25/2017 05/07/2017 01/15/2017  Total Protein 6.5 - 8.1 g/dL 5.4(L) 7.6 6.8  Albumin 3.5 - 5.0 g/dL 2.4(L) 3.6 3.3(L)  AST 15 - 41 U/L 37 21 24  ALT 14 - 54 U/L 20 17 18   Alk Phosphatase 38 - 126 U/L 54 57 50  Total Bilirubin 0.3 - 1.2 mg/dL 2.3(H) 0.6 0.4    IMAGING/STUDIES: CT CHEST W/O 8/11: IMPRESSION: 1. Dense consolidation of the  right lower lobe, and patchy airspace opacities within the remainder of the right lung and left lower lobe, compatible with multifocal pneumonia. 2. Trace right-sided pleural fluid noted. 3. Diffuse coronary artery calcifications noted. 4. An incidental finding of potential clinical significance has been found. Aneurysmal dilatation of the infrarenal abdominal aorta to 3.3 cm in AP dimension. Recommend followup by ultrasound in 3 years. This  recommendation follows ACR consensus guidelines: White Paper of the ACR Incidental Findings Committee II on Vascular Findings. J Am Coll Radiol 2013; 46:950-722** 5. Diffuse aortic atherosclerosis. 6. 1.2 cm nonspecific hypodensity at the right hepatic lobe. 7. Chronic compression deformities at T4, T5, T6, T7, T10 and L1. PORT ABD X-RAY 8/12:  Decreased small bowel dilatation since previous study. PORT CXR 8/12:  Personally reviewed by me. Right lower lobe opacity with chronic elevation of right hemidiaphragm. Somewhat lordotic and rotated film. Endotracheal tube in good position. Left central venous catheter in good position. Enteric feeding tube coursing below diaphragm.  MICROBIOLOGY: MRSA PCR 7/25:  Positive  Blood Cultures x2 8/12 >>> Urine Culture 8/12 >>> Tracheal Aspirate Culture 8/12 >>>  ANTIBIOTICS: Zyvox 8/11 >>> Zosyn 8/11 >>>  SIGNIFICANT EVENTS: 08/07 - Admit 08/11 - Transferred to ICU w/ respiratory failure & atrial fibrillation >> failed BiPAP & was intubated  ASSESSMENT/PLAN:  82 y.o. female with acute hypoxic respiratory failure and atrial fibrillation. Subsequent conversion to normal sinus rhythm. Respiratory status stabilized on ventilator.  1. Acute on chronic hypoxic respiratory failure: Weaning FiO2 and PEEP gradually. In reviewing full ventilator support. Treatment of underlying pneumonia. 2. Aspiration/healthcare associated pneumonia: Continuing broad-spectrum antibiotic coverage as above. Ordering tracheal aspirate. Infectious workup pending. Discontinuing Mucomyst. 3. Sepsis: Secondary to #2. Trending Procalcitonin per algorithm. 4. History of COPD: Continuing scheduled nebulizer therapy. 5. Atrial fibrillation: Resolved. Switching Lopressor to IV as needed for heart rate greater than 120 sustained. Continuous telemetry monitoring. 6. Ileus: Continuing enteric feeding tube to low intermittent suction.  Prophylaxis:  D/C Lovenox & start Heparin [place] q8hr  tomorrow. Continuing SCDs & Protonix IV qhs. Diet:  NPO. No tube feedings. Code Status:  Full Code per previous physician discussions. Disposition:  Remains critically ill in the ICU. Family Update:  No family at bedside at the time of my exam.   Remainder of care as per primary service and other consultants.  I have personally spent a total of 33 minutes of critical care time today caring for the patient & reviewing the patient's electronic medical record.  Sonia Baller Ashok Cordia, M.D. [facility] Pager:  [phone] After 3pm or if no response, call [phone] 2:29 PM 05/25/17

## 2017-05-25 NOTE — Progress Notes (Signed)
[facility] Progress Note Patient Name: Brooke Keller DOB: Sep 02, 1935 MRN: [id number]   Date of Service  05/25/2017  HPI/Events of Note  Mg and K replaced  eICU Interventions       Intervention Category Major Interventions: Electrolyte abnormality - evaluation and management  YACOUB,WESAM 05/25/2017, 4:56 AM

## 2017-05-25 NOTE — Progress Notes (Signed)
PHARMACY NOTE:  ANTIMICROBIAL RENAL DOSAGE ADJUSTMENT  Current antimicrobial regimen includes a mismatch between antimicrobial dosage and estimated renal function.  As per policy approved by the Pharmacy & Therapeutics and Medical Executive Committees, the antimicrobial dosage will be adjusted accordingly.  Current antimicrobial dosage:  Zosyn 3.375g IV q8h  Indication: Aspiration pneumonia  Renal Function:  Estimated Creatinine Clearance: 17.7 mL/min (A) (by C-G formula based on SCr of 1.91 mg/dL (H)). []      On intermittent HD, scheduled: []      On CRRT    Antimicrobial dosage has been changed to:  Zosyn 2.25g IV q8h   Thank you for allowing pharmacy to be a part of this patient's care.  Arrie Senate, PharmD PGY-2 Cardiology Pharmacy Resident Pager: [phone] 05/25/2017

## 2017-05-26 ENCOUNTER — Inpatient Hospital Stay (HOSPITAL_COMMUNITY): Payer: Medicare Other

## 2017-05-26 DIAGNOSIS — J9601 Acute respiratory failure with hypoxia: Secondary | ICD-10-CM

## 2017-05-26 LAB — RENAL FUNCTION PANEL
Albumin: 2.3 g/dL — ABNORMAL LOW (ref 3.5–5.0)
Anion gap: 9 (ref 5–15)
BUN: 33 mg/dL — ABNORMAL HIGH (ref 6–20)
CHLORIDE: 100 mmol/L — AB (ref 101–111)
CO2: 23 mmol/L (ref 22–32)
CREATININE: 1.64 mg/dL — AB (ref 0.44–1.00)
Calcium: 7.8 mg/dL — ABNORMAL LOW (ref 8.9–10.3)
GFR calc non Af Amer: 28 mL/min — ABNORMAL LOW (ref >60)
GFR, EST AFRICAN AMERICAN: 33 mL/min — AB (ref >60)
Glucose, Bld: 160 mg/dL — ABNORMAL HIGH (ref 65–99)
Phosphorus: 2.2 mg/dL — ABNORMAL LOW (ref 2.5–4.6)
Potassium: 4.2 mmol/L (ref 3.5–5.1)
Sodium: 132 mmol/L — ABNORMAL LOW (ref 135–145)

## 2017-05-26 LAB — GLUCOSE, CAPILLARY
GLUCOSE-CAPILLARY: 159 mg/dL — AB (ref 65–99)
GLUCOSE-CAPILLARY: 168 mg/dL — AB (ref 65–99)
GLUCOSE-CAPILLARY: 182 mg/dL — AB (ref 65–99)
GLUCOSE-CAPILLARY: 222 mg/dL — AB (ref 65–99)
Glucose-Capillary: 172 mg/dL — ABNORMAL HIGH (ref 65–99)
Glucose-Capillary: 159 mg/dL — ABNORMAL HIGH (ref 65–99)
Glucose-Capillary: 251 mg/dL — ABNORMAL HIGH (ref 65–99)

## 2017-05-26 LAB — CBC WITH DIFFERENTIAL/PLATELET
Basophils Absolute: 0 10*3/uL (ref 0.0–0.1)
Basophils Relative: 0 %
EOS ABS: 0.1 10*3/uL (ref 0.0–0.7)
Eosinophils Relative: 2 %
HCT: 26 % — ABNORMAL LOW (ref 36.0–46.0)
Hemoglobin: 8.6 g/dL — ABNORMAL LOW (ref 12.0–15.0)
LYMPHS ABS: 0.4 10*3/uL — AB (ref 0.7–4.0)
Lymphocytes Relative: 14 %
MCH: 29.5 pg (ref 26.0–34.0)
MCHC: 33.1 g/dL (ref 30.0–36.0)
MCV: 89 fL (ref 78.0–100.0)
MONO ABS: 0.1 10*3/uL (ref 0.1–1.0)
Monocytes Relative: 4 %
NEUTROS ABS: 2.3 10*3/uL (ref 1.7–7.7)
Neutrophils Relative %: 80 %
PLATELETS: 132 10*3/uL — AB (ref 150–400)
RBC: 2.92 MIL/uL — ABNORMAL LOW (ref 3.87–5.11)
RDW: 16.8 % — AB (ref 11.5–15.5)
WBC MORPHOLOGY: INCREASED
WBC: 2.9 10*3/uL — ABNORMAL LOW (ref 4.0–10.5)

## 2017-05-26 LAB — PROCALCITONIN

## 2017-05-26 LAB — MAGNESIUM: Magnesium: 2.5 mg/dL — ABNORMAL HIGH (ref 1.7–2.4)

## 2017-05-26 LAB — LACTIC ACID, PLASMA: LACTIC ACID, VENOUS: 2.1 mmol/L — AB (ref 0.5–1.9)

## 2017-05-26 LAB — URINE CULTURE
Culture: NO GROWTH
SPECIAL REQUESTS: NORMAL

## 2017-05-26 MED ORDER — PIPERACILLIN-TAZOBACTAM 3.375 G IVPB
3.3750 g | Freq: Three times a day (TID) | INTRAVENOUS | Status: DC
  Administered 2017-05-26 – 2017-05-28 (×6): 3.375 g via INTRAVENOUS
  Filled 2017-05-26 (×7): qty 50

## 2017-05-26 MED ORDER — ACETAMINOPHEN 160 MG/5ML PO SOLN
325.0000 mg | ORAL | Status: DC | PRN
  Administered 2017-05-26: 650 mg via ORAL
  Filled 2017-05-26: qty 20.3

## 2017-05-26 MED ORDER — SODIUM PHOSPHATES 45 MMOLE/15ML IV SOLN
20.0000 mmol | Freq: Once | INTRAVENOUS | Status: AC
  Administered 2017-05-26: 20 mmol via INTRAVENOUS
  Filled 2017-05-26: qty 6.67

## 2017-05-26 MED ORDER — ACETAMINOPHEN 325 MG RE SUPP
325.0000 mg | RECTAL | Status: DC | PRN
  Administered 2017-05-26: 650 mg via RECTAL
  Filled 2017-05-26: qty 2

## 2017-05-26 NOTE — Progress Notes (Signed)
  Progress Note    05/26/2017 12:55 PM 6 Days Post-Op  Subjective:  Remains intubated and sedated  Vitals:   05/26/17 1130 05/26/17 1139  BP: (!) 110/59   Pulse: 71   Resp: (!) 26   Temp:  99.4 F (37.4 C)  SpO2: 99%     Physical Exam: Intubated, unresponsive Abdomen less distended today Incisions right infraclavicular and bilateral groins cdi Monophasic pt signals  CBC    Component Value Date/Time   WBC 2.9 (L) 05/26/2017 0609   RBC 2.92 (L) 05/26/2017 0609   HGB 8.6 (L) 05/26/2017 0609   HCT 26.0 (L) 05/26/2017 0609   PLT 132 (L) 05/26/2017 0609   MCV 89.0 05/26/2017 0609   MCH 29.5 05/26/2017 0609   MCHC 33.1 05/26/2017 0609   RDW 16.8 (H) 05/26/2017 0609   LYMPHSABS 0.4 (L) 05/26/2017 0609   MONOABS 0.1 05/26/2017 0609   EOSABS 0.1 05/26/2017 0609   BASOSABS 0.0 05/26/2017 0609    BMET    Component Value Date/Time   NA 132 (L) 05/26/2017 0609   K 4.2 05/26/2017 0609   CL 100 (L) 05/26/2017 0609   CO2 23 05/26/2017 0609   GLUCOSE 160 (H) 05/26/2017 0609   BUN 33 (H) 05/26/2017 0609   CREATININE 1.64 (H) 05/26/2017 0609   CALCIUM 7.8 (L) 05/26/2017 0609   GFRNONAA 28 (L) 05/26/2017 0609   GFRAA 33 (L) 05/26/2017 0609    INR    Component Value Date/Time   INR 1.06 05/07/2017 0944     Intake/Output Summary (Last 24 hours) at 05/26/17 1255 Last data filed at 05/26/17 1200  Gross per 24 hour  Intake          3915.08 ml  Output             2035 ml  Net          1880.08 ml     Assessment:  82 y.o. female is s/p ax-bifem bypass. Intubated and sedated on pressors. Bypass grafting patent by physical exam.  Plan: Medical management per pccm much appreciated Ng tube to low wall suction continuous Dry dressings to groins  Brandon C. Donzetta Matters, MD [facility] Office: [phone] Pager: [phone]  05/26/2017 12:55 PM

## 2017-05-26 NOTE — Care Management Note (Addendum)
Case Management Note  Patient Details  Name: SARAHANNE NOVAKOWSKI MRN: [id number] Date of Birth: [birth year]  Subjective/Objective: from home with spouse, POD 6 R axillobifemoral bypass, transferred to ICU with Ileus, Resp distress, afib with RVR, sepsis, asp pna.  Intubated on 8/11 conts on amio, precedex and levophed.  Family would like for patient to go home at dc if possible per MD note.  She has two daughters who also assist in her care at home.  [facility] offered services and Family states patient is being well managed at home at this time, and they also declined EMMI.    8/15 Fellows, BSN - extubated today, conts with NG tube to low wall suction, conts on iv abx, amio drip.                    Action/Plan: NCM will follow for dc needs.  Expected Discharge Date:                  Expected Discharge Plan:  [place]  In-House Referral:     Discharge planning Services  CM Consult  Post Acute Care Choice:  Home Health Choice offered to:     DME Arranged:    DME Agency:     HH Arranged:    [place] Agency:     Status of Service:  In process, will continue to follow  If discussed at Long Length of Stay Meetings, dates discussed:    Additional Comments:  Zenon Mayo, RN 05/26/2017, 9:48 AM

## 2017-05-26 NOTE — Progress Notes (Signed)
[facility] Note  ADMISSION DATE:  05/20/2017  CONSULTATION DATE: 05/24/2017  REFERRING MD:  Servando Snare, M.D. / Vascular Surgery   Presenting HPI:  82 y.o. female with history of COPD, chronic hypoxic respiratory failure, diabetes mellitus type 2, congestive heart failure, hypertension, and multiple myeloma. Patient underwent bypass surgery for non-healing ulcer of the ankle. Patient developed subsequent abdominal distention and ileus but refused NG tube placement. Patient became more altered and hypoxic overnight with nausea and vomiting. Patient had subsequent probable aspiration. Patient was noted to be in atrial fibrillation with rapid ventricular response and hypertensive. Metoprolol and Lasix were administered. Patient was transferred to the ICU and placed on noninvasive positive pressure ventilation. Patient's respiratory status remained marginal at best and ultimately she was endotracheally intubated.  Subjective:  RN reports wounds are clean, no acute events overnight.  Remains on 6 mcg of levophed, vasopressin.  Weaning on 15/5.     Vent Mode: PRVC FiO2 (%):  [40 %-50 %] 40 % Set Rate:  [30 bmp] 30 bmp Vt Set:  [340 mL] 340 mL PEEP:  [5 cmH20-8 cmH20] 5 cmH20 Plateau Pressure:  [16 cmH20-22 cmH20] 18 cmH20  Temp:  [99.1 F (37.3 C)-103.4 F (39.7 C)] 100.1 F (37.8 C) (08/13 0805) Pulse Rate:  [32-109] 62 (08/13 0830) Resp:  [13-36] 26 (08/13 0830) BP: (79-154)/(46-75) 112/58 (08/13 0830) SpO2:  [98 %-100 %] 98 % (08/13 0830) FiO2 (%):  [40 %-50 %] 40 % (08/13 0312) Weight:  [138 lb 10.7 oz (62.9 kg)] 138 lb 10.7 oz (62.9 kg) (08/13 8466)  General: frail elderly female in NAD on vent HEENT: MM pink/moist, ETT  Neuro: sedate, L pupil 16m, R pupil 275m(baseline size, hx macular degeneration, prior cataract surgery) CV: s1s2 rrr, no m/r/g PULM: even/non-labored, clear anterior, diminished RLL GIZL:DJTTnon-tender, bsx4 active  Extremities: warm/dry, no  edema, BLE cool to touch, wrapped in ace bandages, pulses doppler +  Skin: no rashes or lesions.  Surgical sites c/d/i   LINES/TUBES: OETT 8/11 >> L [place] CVL 8/11 >> IMPLANTED PORT >> Foley >> OGT >> PIV  CBC Latest Ref Rng & Units 05/26/2017 05/25/2017 05/24/2017  WBC 4.0 - 10.5 K/uL 2.9(L) 8.8 12.5(H)  Hemoglobin 12.0 - 15.0 g/dL 8.6(L) 9.3(L) 10.8(L)  Hematocrit 36.0 - 46.0 % 26.0(L) 29.1(L) 32.9(L)  Platelets 150 - 400 K/uL 132(L) 180 232   BMP Latest Ref Rng & Units 05/26/2017 05/25/2017 05/25/2017  Glucose 65 - 99 mg/dL 160(H) 211(H) 151(H)  BUN 6 - 20 mg/dL 33(H) 29(H) 34(H)  Creatinine 0.44 - 1.00 mg/dL 1.64(H) 1.71(H) 1.91(H)  Sodium 135 - 145 mmol/L 132(L) 134(L) 133(L)  Potassium 3.5 - 5.1 mmol/L 4.2 3.3(L) 3.6  Chloride 101 - 111 mmol/L 100(L) 99(L) 99(L)  CO2 22 - 32 mmol/L 23 23 25   Calcium 8.9 - 10.3 mg/dL 7.8(L) 8.0(L) 8.3(L)   Hepatic Function Latest Ref Rng & Units 05/26/2017 05/25/2017 05/07/2017  Total Protein 6.5 - 8.1 g/dL - 5.4(L) 7.6  Albumin 3.5 - 5.0 g/dL 2.3(L) 2.4(L) 3.6  AST 15 - 41 U/L - 37 21  ALT 14 - 54 U/L - 20 17  Alk Phosphatase 38 - 126 U/L - 54 57  Total Bilirubin 0.3 - 1.2 mg/dL - 2.3(H) 0.6    IMAGING/STUDIES: CT CHEST W/O 8/11: IMPRESSION: 1. Dense consolidation of the right lower lobe, and patchy airspace opacities within the remainder of the right lung and left lower lobe, compatible with multifocal pneumonia. 2. Trace right-sided  pleural fluid noted. 3. Diffuse coronary artery calcifications noted. 4. An incidental finding of potential clinical significance has been found. Aneurysmal dilatation of the infrarenal abdominal aorta to 3.3 cm in AP dimension. Recommend followup by ultrasound in 3 years. This recommendation follows ACR consensus guidelines: White Paper of the ACR Incidental Findings Committee II on Vascular Findings. J Am Coll Radiol 2013; 41:583-094 5. Diffuse aortic atherosclerosis. 6. 1.2 cm nonspecific hypodensity at the  right hepatic lobe. 7. Chronic compression deformities at T4, T5, T6, T7, T10 and L1. PORT ABD X-RAY 8/12:  Decreased small bowel dilatation since previous study. PORT CXR 8/12:  Right lower lobe opacity with chronic elevation of right hemidiaphragm. Somewhat lordotic and rotated film. Endotracheal tube in good position. Left central venous catheter in good position. Enteric feeding tube coursing below diaphragm.  MICROBIOLOGY: MRSA PCR 7/25:  Positive  Blood Cultures x2 8/12 >> Urine Culture 8/12 >> Tracheal Aspirate Culture 8/12 >> rare WBC both PMN, rare GNR / GPR >>  ANTIBIOTICS: Zyvox 8/11 >> Zosyn 8/11 >>  SIGNIFICANT EVENTS: 08/07 - Admit 08/11 - Transferred to ICU w/ respiratory failure & atrial fibrillation >> failed BiPAP & was intubated  ASSESSMENT/PLAN:  82 y.o. female with acute hypoxic respiratory failure and atrial fibrillation. Subsequent conversion to normal sinus rhythm. Respiratory status stabilized on ventilator.  Acute on chronic hypoxic respiratory failure P: PRVC 8cc/kg as rest mode Daily SBT / WUA with PSV wean  Follow intermittent CXR    Aspiration/healthcare associated pneumonia P: Continue Zosyn + Zyvox  Follow tracheal aspirate  Monitor RLL on CXR, suspected aspiration    Septic Shock - in setting of aspiration PNA P: Trend PCT  ABX as above  Follow cultures PRN tylenol for fever  Monitor WBC trend, note decrease / anticipate this is stress response vs spurious > repeat CBC in am Stress dose steroids  Continue levophed, vasopressin Trend PCT    History of COPD P: Continue Duoneb Q6    Atrial fibrillation - resolved P: ICU monitoring  PRN Lopressor for HR > 120 sustained  Continue amiodarone   Ileus P: NGT to LIS  Monitor for vomiting  Keep K >4 with ileus    Hyponatremia Hypophosphatemia  P: Replace Na/Phos  Follow up labs in am    Hx Multiple Myeloma  P: Hold lenalidomide for now as can not give per tube Baseline  prednisone dependent 17m     Prophylaxis:  Heparin Baxter q8hr + SCDs for DVT prophylaxis & Protonix IV qhs for SUP. Diet:  NPO. No tube feedings. Code Status:  Full Code per previous physician discussions. Disposition:  Remains critically ill in the ICU. Family Update:  Family updated at bedside am 8/13.  They report they would not want any sort of rehab if needed.  They would want to take her home.    CC Time:  30 minutes    BNoe Gens NP-C [facility] Pgr: [phone] or if no answer [phone]/13/2018, 8:48 AM

## 2017-05-26 NOTE — Progress Notes (Signed)
PHARMACY NOTE:  ANTIMICROBIAL RENAL DOSAGE ADJUSTMENT  Current antimicrobial regimen includes a mismatch between antimicrobial dosage and estimated renal function.  As per policy approved by the Pharmacy & Therapeutics and Medical Executive Committees, the antimicrobial dosage will be adjusted accordingly.  Current antimicrobial dosage:  Zosyn 2.25g IV q8h  Indication: Aspiration pneumonia  Renal Function:  Estimated Creatinine Clearance: 24 mL/min (A) (by C-G formula based on SCr of 1.64 mg/dL (H)). - improved  []      On intermittent HD, scheduled: []      On CRRT    Antimicrobial dosage has been changed to:  Zosyn 3.375g IV q8h   Thank you for allowing pharmacy to be a part of this patient's care.  Albertina Parr, PharmD., BCPS Clinical Pharmacist Pager [phone]

## 2017-05-26 NOTE — Care Management Important Message (Signed)
Important Message  Patient Details  Name: Brooke Keller MRN: [id number] Date of Birth: 1934-12-26   Medicare Important Message Given:  Yes    Zenon Mayo, RN 05/26/2017, 10:54 AMImportant Message  Patient Details  Name: Brooke Keller MRN: [id number] Date of Birth: [birth year]   Medicare Important Message Given:  Yes    Zenon Mayo, RN 05/26/2017, 10:54 AM

## 2017-05-27 ENCOUNTER — Inpatient Hospital Stay (HOSPITAL_COMMUNITY): Payer: Medicare Other

## 2017-05-27 LAB — CBC WITH DIFFERENTIAL/PLATELET
Basophils Absolute: 0 10*3/uL (ref 0.0–0.1)
Basophils Relative: 0 %
EOS PCT: 1 %
Eosinophils Absolute: 0 10*3/uL (ref 0.0–0.7)
HEMATOCRIT: 22.8 % — AB (ref 36.0–46.0)
HEMOGLOBIN: 7.5 g/dL — AB (ref 12.0–15.0)
Lymphocytes Relative: 11 %
Lymphs Abs: 0.3 10*3/uL — ABNORMAL LOW (ref 0.7–4.0)
MCH: 28.3 pg (ref 26.0–34.0)
MCHC: 32.9 g/dL (ref 30.0–36.0)
MCV: 86 fL (ref 78.0–100.0)
MONO ABS: 0.2 10*3/uL (ref 0.1–1.0)
MONOS PCT: 7 %
NEUTROS ABS: 2 10*3/uL (ref 1.7–7.7)
Neutrophils Relative %: 81 %
Platelets: 104 10*3/uL — ABNORMAL LOW (ref 150–400)
RBC: 2.65 MIL/uL — ABNORMAL LOW (ref 3.87–5.11)
RDW: 16.6 % — ABNORMAL HIGH (ref 11.5–15.5)
WBC: 2.5 10*3/uL — ABNORMAL LOW (ref 4.0–10.5)

## 2017-05-27 LAB — RENAL FUNCTION PANEL
ALBUMIN: 2 g/dL — AB (ref 3.5–5.0)
Anion gap: 11 (ref 5–15)
BUN: 38 mg/dL — AB (ref 6–20)
CHLORIDE: 98 mmol/L — AB (ref 101–111)
CO2: 22 mmol/L (ref 22–32)
CREATININE: 1.5 mg/dL — AB (ref 0.44–1.00)
Calcium: 7.2 mg/dL — ABNORMAL LOW (ref 8.9–10.3)
GFR calc Af Amer: 37 mL/min — ABNORMAL LOW (ref >60)
GFR, EST NON AFRICAN AMERICAN: 32 mL/min — AB (ref >60)
GLUCOSE: 205 mg/dL — AB (ref 65–99)
POTASSIUM: 2.8 mmol/L — AB (ref 3.5–5.1)
Phosphorus: 3.5 mg/dL (ref 2.5–4.6)
Sodium: 131 mmol/L — ABNORMAL LOW (ref 135–145)

## 2017-05-27 LAB — GLUCOSE, CAPILLARY
GLUCOSE-CAPILLARY: 171 mg/dL — AB (ref 65–99)
GLUCOSE-CAPILLARY: 192 mg/dL — AB (ref 65–99)
GLUCOSE-CAPILLARY: 213 mg/dL — AB (ref 65–99)
GLUCOSE-CAPILLARY: 226 mg/dL — AB (ref 65–99)
Glucose-Capillary: 198 mg/dL — ABNORMAL HIGH (ref 65–99)

## 2017-05-27 LAB — BASIC METABOLIC PANEL
Anion gap: 10 (ref 5–15)
BUN: 35 mg/dL — AB (ref 6–20)
CALCIUM: 7 mg/dL — AB (ref 8.9–10.3)
CHLORIDE: 99 mmol/L — AB (ref 101–111)
CO2: 24 mmol/L (ref 22–32)
CREATININE: 1.42 mg/dL — AB (ref 0.44–1.00)
GFR calc Af Amer: 39 mL/min — ABNORMAL LOW (ref >60)
GFR calc non Af Amer: 34 mL/min — ABNORMAL LOW (ref >60)
Glucose, Bld: 178 mg/dL — ABNORMAL HIGH (ref 65–99)
Potassium: 3.1 mmol/L — ABNORMAL LOW (ref 3.5–5.1)
SODIUM: 133 mmol/L — AB (ref 135–145)

## 2017-05-27 LAB — LACTIC ACID, PLASMA: LACTIC ACID, VENOUS: 2.4 mmol/L — AB (ref 0.5–1.9)

## 2017-05-27 LAB — MAGNESIUM: Magnesium: 2 mg/dL (ref 1.7–2.4)

## 2017-05-27 MED ORDER — MIDAZOLAM HCL 2 MG/2ML IJ SOLN: 1.0000 mg | INTRAMUSCULAR | Status: DC | PRN

## 2017-05-27 MED ORDER — FENTANYL CITRATE (PF) 100 MCG/2ML IJ SOLN
25.0000 ug | INTRAMUSCULAR | Status: DC | PRN
  Administered 2017-05-28: 50 ug via INTRAVENOUS
  Administered 2017-05-28: 25 ug via INTRAVENOUS
  Administered 2017-05-28: 50 ug via INTRAVENOUS
  Filled 2017-05-27 (×3): qty 2

## 2017-05-27 MED ORDER — INSULIN ASPART 100 UNIT/ML ~~LOC~~ SOLN
0.0000 [IU] | SUBCUTANEOUS | Status: DC
  Administered 2017-05-27: 5 [IU] via SUBCUTANEOUS
  Administered 2017-05-27 – 2017-05-28 (×5): 3 [IU] via SUBCUTANEOUS
  Administered 2017-05-28 – 2017-05-31 (×10): 2 [IU] via SUBCUTANEOUS
  Administered 2017-06-01: 3 [IU] via SUBCUTANEOUS
  Administered 2017-06-01 – 2017-06-02 (×3): 2 [IU] via SUBCUTANEOUS
  Administered 2017-06-02: 3 [IU] via SUBCUTANEOUS
  Administered 2017-06-02 – 2017-06-03 (×4): 2 [IU] via SUBCUTANEOUS
  Administered 2017-06-03: 3 [IU] via SUBCUTANEOUS
  Administered 2017-06-03 – 2017-06-04 (×2): 2 [IU] via SUBCUTANEOUS
  Administered 2017-06-04: 3 [IU] via SUBCUTANEOUS

## 2017-05-27 MED ORDER — POTASSIUM CHLORIDE 10 MEQ/50ML IV SOLN
10.0000 meq | INTRAVENOUS | Status: AC
  Administered 2017-05-27 (×2): 10 meq via INTRAVENOUS
  Filled 2017-05-27 (×2): qty 50

## 2017-05-27 MED ORDER — POTASSIUM CHLORIDE 10 MEQ/50ML IV SOLN
10.0000 meq | INTRAVENOUS | Status: AC
  Administered 2017-05-27 (×3): 10 meq via INTRAVENOUS
  Filled 2017-05-27 (×3): qty 50

## 2017-05-27 MED ORDER — POTASSIUM CHLORIDE 10 MEQ/100ML IV SOLN
10.0000 meq | INTRAVENOUS | Status: AC
  Administered 2017-05-27 (×3): 10 meq via INTRAVENOUS
  Filled 2017-05-27 (×3): qty 100

## 2017-05-27 NOTE — Progress Notes (Addendum)
Initial Nutrition Assessment  DOCUMENTATION CODES:   Obesity unspecified  INTERVENTION:   If unable to extubate within 24 hours, recommend initiaiton of Enteral Nutrition  Tube Feeding Recommendations:   Recommend Vital High Protein at rate of 40 ml/hr providing 960 kcals, 85 g of protein and 806 mL of free water.    NUTRITION DIAGNOSIS:   Inadequate oral intake related to acute illness as evidenced by NPO status.  GOAL:   Patient will meet greater than or equal to 90% of their needs  MONITOR:   Labs, Weight trends, Vent status  REASON FOR ASSESSMENT:   Ventilator    ASSESSMENT:   82 yo female admitted with right non-healing ulcer over right lateral malleolus. Developed acute respiratory failure, sepsis, ARF. Pt with hx of IgG multiple myeloma, COPD, PAD, DM, CHF  8/7 Admit for ax-bifem bypass 8/11 Transfer to ICU with respiratory failure,  failed BiPaP, required intubation  Pt currently sedated on vent. Per RN, plan for possible extubation tomorrow  NG tube in place, NPO. Ileus appears to be improving, Abdomen soft, +BMs, reduced NG output  Family at bedside and report pt with good appetite PTA with no major changes in weight. Verified pt heigh of 4 feet 7 inches  Nutrition-Focused physical exam completed. Findings are WDL for fat depletion, muscle depletion, and edema.   Labs: sodium 131, potassium 2.8, phosphorus and magnesium wdl, CBGs 168-251 Meds: LR at 100 ml/hr  Diet Order:  Diet NPO time specified  Skin:  Reviewed, no issues (surgical incisions)  Last BM:  8/14  Height:   Ht Readings from Last 1 Encounters:  05/26/17 4' 7"  (1.397 m)    Weight:   Wt Readings from Last 1 Encounters:  05/27/17 148 lb 5.9 oz (67.3 kg)    Ideal Body Weight:     BMI:  Body mass index is 34.48 kg/m.  Estimated Nutritional Needs:   Kcal:  782-423 kcals  Protein:  >/= 76 g of protein  Fluid:  >/= 1.2 L  EDUCATION NEEDS:   No education needs identified  at this time  [place], [place], LDN [phone] Pager  [phone] Weekend/On-Call Pager

## 2017-05-27 NOTE — Progress Notes (Addendum)
  Progress Note    05/27/2017 8:53 AM 7 Days Post-Op  Subjective:  Intubated/sedated  Afebrile HR 40's-60's SB 16'X-096'E systolic 454% .[id number]   Gtts: Amiodarone Vasopressin Dexmedetomide Levophed off at 0600  ABx: Zosyn Linezolid   Vitals:   05/27/17 0707 05/27/17 0800  BP:  (!) 99/52  Pulse:  (!) 53  Resp:    Temp:  98.7 F (37.1 C)  SpO2: 100% 100%    Physical Exam: Cardiac:  regular Lungs:  intubated Incisions:  Bilateral groins look good with dry gauze in place; right chest incision is healing nicely Extremities:  Monophasic PT signals bilaterally  CBC    Component Value Date/Time   WBC 2.5 (L) 05/27/2017 0340   RBC 2.65 (L) 05/27/2017 0340   HGB 7.5 (L) 05/27/2017 0340   HCT 22.8 (L) 05/27/2017 0340   PLT 104 (L) 05/27/2017 0340   MCV 86.0 05/27/2017 0340   MCH 28.3 05/27/2017 0340   MCHC 32.9 05/27/2017 0340   RDW 16.6 (H) 05/27/2017 0340   LYMPHSABS 0.3 (L) 05/27/2017 0340   MONOABS 0.2 05/27/2017 0340   EOSABS 0.0 05/27/2017 0340   BASOSABS 0.0 05/27/2017 0340    BMET    Component Value Date/Time   NA 131 (L) 05/27/2017 0340   K 2.8 (L) 05/27/2017 0340   CL 98 (L) 05/27/2017 0340   CO2 22 05/27/2017 0340   GLUCOSE 205 (H) 05/27/2017 0340   BUN 38 (H) 05/27/2017 0340   CREATININE 1.50 (H) 05/27/2017 0340   CALCIUM 7.2 (L) 05/27/2017 0340   GFRNONAA 32 (L) 05/27/2017 0340   GFRAA 37 (L) 05/27/2017 0340    INR    Component Value Date/Time   INR 1.06 05/07/2017 0944     Intake/Output Summary (Last 24 hours) at 05/27/17 0853 Last data filed at 05/27/17 0800  Gross per 24 hour  Intake          4514.54 ml  Output              795 ml  Net          3719.54 ml     Assessment:  82 y.o. female is s/p:  Right axillobifemoral bypass graft  7 Days Post-Op  Plan: -pt with +doppler signals bilateral PT -groin incisions look ok-continue dry gauze to bilateral groins to wick moisture. -vent/medical management per ccm -DVT  prophylaxis:  SQ heparin    Leontine Locket, PA-C [facility] [phone] 05/27/2017 8:53 AM   I have interviewed and examined patient with PA and agree with assessment and plan above.   Rennie Rouch C. Donzetta Matters, MD [facility] Office: [phone] Pager: [phone]

## 2017-05-27 NOTE — Progress Notes (Signed)
Occupational Therapy Treatment Patient Details Name: Brooke Keller MRN: [id number] DOB: 12-31-1934 Today's Date: 05/27/2017    History of present illness Pt is an 82 y.o. female s/p R axillobifemoral bypass graft on 8/7. PMHx: CHF, COPD, DM, GERD, HTN, Multiple myeloma.   OT comments  Pt able to participate in bed level Exercises for bil. UEs and LEs.   VSS.  Daughter present.    Follow Up Recommendations  Home health OT;Supervision/Assistance - 24 hour    Equipment Recommendations  None recommended by OT    Recommendations for Other Services PT consult    Precautions / Restrictions Precautions Precautions: Fall       Mobility Bed Mobility                  Transfers                      Balance                                           ADL either performed or assessed with clinical judgement   ADL                                               Vision       Perception     Praxis      Cognition Arousal/Alertness: Awake/alert;Lethargic                                     General Comments: Pt follows commands for exercise.   ETT in place         Exercises Exercises: Low Level/ICU General Exercises - Upper Extremity Shoulder Extension: Strengthening;Right;Left;10 reps;Supine Shoulder ABduction: AROM;Both;10 reps;Supine Shoulder ADduction: AROM;Both;10 reps;Supine Elbow Extension: Strengthening;Both;10 reps;Supine Low Level/ICU Exercises Ankle Circles/Pumps: AROM;Both;15 reps;Supine Quad Sets: AROM;Right;Left;10 reps;Supine Hip ABduction/ADduction: AAROM;Both;15 reps;Supine Heel Slides: AROM;Strengthening;Both;15 reps;Supine Shoulder Flexion: Strengthening;Both;15 reps;Supine Elbow Flexion: Strengthening;Right;Left;10 reps;Supine   Shoulder Instructions       General Comments      Pertinent Vitals/ Pain       Pain Assessment: Faces Faces Pain Scale: No hurt  Home Living                                           Prior Functioning/Environment              Frequency  Min 2X/week        Progress Toward Goals  OT Goals(current goals can now be found in the care plan section)  Progress towards OT goals: Not progressing toward goals - comment (medical decline )     Plan Other (comment);Discharge plan remains appropriate (will reassess once pt is extubated )    Co-evaluation                 AM-PAC PT "6 Clicks" Daily Activity     Outcome Measure   Help from another person eating meals?: Total Help from another person taking care of personal grooming?: Total Help from another person toileting, which includes using toliet, bedpan, or  urinal?: Total Help from another person bathing (including washing, rinsing, drying)?: Total Help from another person to put on and taking off regular upper body clothing?: Total Help from another person to put on and taking off regular lower body clothing?: Total 6 Click Score: 6    End of Session    OT Visit Diagnosis: Muscle weakness (generalized) (M62.81)   Activity Tolerance Treatment limited secondary to medical complications (Comment) (ETT - intubated )   Patient Left in bed;with call bell/phone within reach;with family/visitor present   Nurse Communication Other (comment) (activity status )        Time: 9211-9417 OT Time Calculation (min): 16 min  Charges: OT General Charges $OT Visit: 1 Procedure OT Treatments $Therapeutic Exercise: 8-22 mins  [facility], OTR/L [phone]    [name] M 05/27/2017, 4:30 PM

## 2017-05-27 NOTE — Progress Notes (Signed)
CRITICAL VALUE ALERT  Critical Value:  Lactic 2.4  Date & Time Notied:  0448  Provider Notified: Elink RN notified; she states she will tell Elink MD  Orders Received/Actions taken: None

## 2017-05-27 NOTE — Progress Notes (Signed)
[facility] Note  ADMISSION DATE:  05/20/2017  CONSULTATION DATE: 05/24/2017  REFERRING MD:  Servando Snare, M.D. / Vascular Surgery   Presenting HPI:  82 y.o. female with history of COPD, chronic hypoxic respiratory failure, diabetes mellitus type 2, congestive heart failure, hypertension, and multiple myeloma. Patient underwent bypass surgery for non-healing ulcer of the ankle. Patient developed subsequent abdominal distention and ileus but refused NG tube placement. Patient became more altered and hypoxic overnight with nausea and vomiting. Patient had subsequent probable aspiration. Patient was noted to be in atrial fibrillation with rapid ventricular response and hypertensive. Metoprolol and Lasix were administered. Patient was transferred to the ICU and placed on noninvasive positive pressure ventilation. Patient's respiratory status remained marginal at best and ultimately she was endotracheally intubated.  Subjective:  No acute events overnight.   Review of Systems:  Unable to obtain with patient intubated.  Vent Mode: PRVC FiO2 (%):  [30 %-40 %] 30 % Set Rate:  [30 bmp] 30 bmp Vt Set:  [340 mL] 340 mL PEEP:  [5 cmH20] 5 cmH20 Pressure Support:  [12 cmH20] 12 cmH20 Plateau Pressure:  [17 cmH20-21 cmH20] 20 cmH20  Temp:  [98.4 F (36.9 C)-100.2 F (37.9 C)] 98.6 F (37 C) (08/13 2329) Pulse Rate:  [30-78] 48 (08/14 0700) Resp:  [10-31] 20 (08/14 0500) BP: (75-132)/(44-88) 96/49 (08/14 0700) SpO2:  [95 %-100 %] 100 % (08/14 0706) FiO2 (%):  [30 %-40 %] 30 % (08/14 0706) Weight:  [148 lb 5.9 oz (67.3 kg)] 148 lb 5.9 oz (67.3 kg) (08/14 0430)  General: frail elderly female in NAD on vent HEENT: MM pink/moist, ETT  Neuro: sedate, L pupil 2m, R pupil 237m(baseline size, hx macular degeneration, prior cataract surgery) CV: s1s2 rrr, no m/r/g PULM: even/non-labored, clear anterior, diminished RLL GISJ:GGEZnon-tender, bsx4 active  Extremities: warm/dry, no  edema, BLE cool to touch, wrapped in ace bandages, pulses doppler +  Skin: no rashes or lesions.  Surgical sites c/d/i   LINES/TUBES: OETT 8/11 >> L [place] CVL 8/11 >> IMPLANTED PORT >> Foley >> OGT >> PIV  CBC Latest Ref Rng & Units 05/27/2017 05/26/2017 05/25/2017  WBC 4.0 - 10.5 K/uL 2.5(L) 2.9(L) 8.8  Hemoglobin 12.0 - 15.0 g/dL 7.5(L) 8.6(L) 9.3(L)  Hematocrit 36.0 - 46.0 % 22.8(L) 26.0(L) 29.1(L)  Platelets 150 - 400 K/uL PENDING 132(L) 180   BMP Latest Ref Rng & Units 05/27/2017 05/26/2017 05/25/2017  Glucose 65 - 99 mg/dL 205(H) 160(H) 211(H)  BUN 6 - 20 mg/dL 38(H) 33(H) 29(H)  Creatinine 0.44 - 1.00 mg/dL 1.50(H) 1.64(H) 1.71(H)  Sodium 135 - 145 mmol/L 131(L) 132(L) 134(L)  Potassium 3.5 - 5.1 mmol/L 2.8(L) 4.2 3.3(L)  Chloride 101 - 111 mmol/L 98(L) 100(L) 99(L)  CO2 22 - 32 mmol/L _0 Calcium 8.9 - 10.3 mg/dL 7.2(L) 7.8(L) 8.0(L)   Hepatic Function Latest Ref Rng & Units 05/27/2017 05/26/2017 05/25/2017  Total Protein 6.5 - 8.1 g/dL - - 5.4(L)  Albumin 3.5 - 5.0 g/dL 2.0(L) 2.3(L) 2.4(L)  AST 15 - 41 U/L - - 37  ALT 14 - 54 U/L - - 20  Alk Phosphatase 38 - 126 U/L - - 54  Total Bilirubin 0.3 - 1.2 mg/dL - - 2.3(H)    IMAGING/STUDIES: CT CHEST W/O 8/11: IMPRESSION: 1. Dense consolidation of the right lower lobe, and patchy airspace opacities within the remainder of the right lung and left lower lobe, compatible with multifocal pneumonia. 2. Trace right-sided pleural fluid  noted. 3. Diffuse coronary artery calcifications noted. 4. An incidental finding of potential clinical significance has been found. Aneurysmal dilatation of the infrarenal abdominal aorta to 3.3 cm in AP dimension. Recommend followup by ultrasound in 3 years. This recommendation follows ACR consensus guidelines: White Paper of the ACR Incidental Findings Committee II on Vascular Findings. J Am Coll Radiol 2013; 48:185-631 5. Diffuse aortic atherosclerosis. 6. 1.2 cm nonspecific hypodensity at the  right hepatic lobe. 7. Chronic compression deformities at T4, T5, T6, T7, T10 and L1. PORT ABD X-RAY 8/12:  Decreased small bowel dilatation since previous study. PORT CXR 8/12:  Right lower lobe opacity with chronic elevation of right hemidiaphragm. Somewhat lordotic and rotated film. Endotracheal tube in good position. Left central venous catheter in good position. Enteric feeding tube coursing below diaphragm. PORT CXR 8/14:  Personally reviewed by me. Persistent patchy bilateral opacities, right predominant, relatively unchanged when compared with x-ray imaging from 8/12. No new pleural effusion. Endotracheal tube and left central venous catheter in good position. Enteric feeding tube coursing below diaphragm.  MICROBIOLOGY: MRSA PCR 7/25:  Positive  Blood Cultures x2 8/12 >> Urine Culture 8/12:  Negative  Tracheal Aspirate Culture 8/12 >> Moderate Klebsiella pneumoniae   ANTIBIOTICS: Zyvox 8/11 - 8/14 Zosyn 8/11 >>  SIGNIFICANT EVENTS: 08/07 - Admit 08/11 - Transferred to ICU w/ respiratory failure & atrial fibrillation >> failed BiPAP & was intubated 08/12 - Tolerated 8 hours of PS 12/5 weaning   ASSESSMENT/PLAN:    PULMONARY A: Acute hypoxic respiratory failure: Secondary to aspiration. History of COPD  P:   Continuing full ventilator support Daily pressure support weaning & spontaneous breathing trial Continuing Duonebs every 6 hours Intermittent chest x-ray imaging  CARDIOVASCULAR A:  Shock: Likely secondary to sepsis. Peripheral vascular disease: Status post bypass. Atrial fibrillation:  New diagnosis.  P:  Monitoring vitals per protocol Weaning vasopressor infusion for MAP >65 & SBP >90 Continuous telemetry monitoring ASA 189m PR daily Amiodarone drip Holding Lasix Postop care per vascular surgery  RENAL A:   Acute on chronic renal failure: Improving. Hypokalemia: Replaced.  P:   Repeat basic metabolic panel at 5 PM. Trending urine output with Foley  catheter Avoiding nephrotoxic agents Monitoring renal function like lites daily  GASTROINTESTINAL A:   Ileus  P:   NPO OGT to low intermittent suction  HEMATOLOGIC/ONCOLOGIC A:   Pancytopenia:  Possibly due to sepsis. H/O IgG Multiple Myeloma  P:  Trending cell counts daily with CBC Transfusing for Hgb <7.0 or active bleeding Holding tx of Multiple Myeloma  INFECTIOUS A:   Sepsis Klebsiella Pneumonia:  Secondary to aspiration.  P:   Discontinuing Zyvox Continuing empiric Zosyn while awaiting sensitivities Awaiting finalization of cultures Trending Procalcitonin problem  ENDOCRINE A:   Diabetes Mellitus:  Hyperglycemic. Chronic Prednisone    P:   Continuing accu-checks q4hr SSI increasing to Moderate Algorithm Hydrocortisonie IV q6hr Holding home Prednisone & Metformin  NEUROLOGIC A:   Sedation on Ventilator Chronic Benzodiazepine Use  P:   RASS goal: 0 to -1 Precedex gtt Fentanyl IV prn Versed IV prn  Prophylaxis:  Protonix IV daily, SCDs, & heparin subcutaneous every 8 hours. Diet:  NPO. No tube feedings. Code Status:  Full Code per previous physician discussions. Disposition:  Remains critically ill in the ICU. Family Update:  Daughter updated at bedside.  DISCUSSION:  82y.o. female with aspiration pneumonia. Now growing Klebsiella. Acute hypoxic respiratory failure improving. Atrial fibrillation controlled at this time. Weaning vasopressor support. Patient clinically  improving but still remains guarded prognosis.   Remainder of care as per primary service and other consultants.  I have spent a total of 33 minutes of critical care time today caring for the patient, updating family at bedside, and reviewing the patient's electronic medical record.   Sonia Baller Ashok Cordia, M.D. [facility] Pager:  [phone] After 3pm or if no response, call [phone] 05/27/2017, 8:05 AM

## 2017-05-27 NOTE — Progress Notes (Signed)
Inpatient Diabetes Program Recommendations  AACE/ADA: New Consensus Statement on Inpatient Glycemic Control (2015)  Target Ranges:  Prepandial:   less than 140 mg/dL      Peak postprandial:   less than 180 mg/dL (1-2 hours)      Critically ill patients:  140 - 180 mg/dL   Results for Brooke Keller, Brooke Keller (MRN [id number]) as of 05/27/2017 08:59  Ref. Range 05/26/2017 07:58 05/26/2017 11:33 05/26/2017 15:47 05/26/2017 20:18 05/26/2017 23:44 05/27/2017 03:58 05/27/2017 07:55  Glucose-Capillary Latest Ref Range: 65 - 99 mg/dL 168 (H) 182 (H) 222 (H) 251 (H) 213 (H) 192 (H) 226 (H)   Review of Glycemic Control  Diabetes history: DM2 Outpatient Diabetes medications: Metformin 500 mg QAM Current orders for Inpatient glycemic control: Novolog 0-15 units Q4H  Inpatient Diabetes Program Recommendations: Correction (SSI): Please consider discontinuing current Glycemic Control orders and use ICU Glycemic Control order set to improve inpatient glycemic control.  Thanks, Barnie Alderman, RN, MSN, CDE Diabetes Coordinator Inpatient Diabetes Program [phone] (Team Pager from 8am to 5pm)

## 2017-05-27 NOTE — Progress Notes (Signed)
eLink Physician-Brief Progress Note Patient Name: Brooke Keller DOB: 04/21/1935 MRN: [id number]   Date of Service  05/27/2017  HPI/Events of Note    eICU Interventions  Hypokalemia -repleted      Intervention Category Intermediate Interventions: Electrolyte abnormality - evaluation and management  [name] V. 05/27/2017, 5:16 AM

## 2017-05-28 ENCOUNTER — Inpatient Hospital Stay (HOSPITAL_COMMUNITY): Payer: Medicare Other

## 2017-05-28 LAB — GLUCOSE, CAPILLARY
GLUCOSE-CAPILLARY: 164 mg/dL — AB (ref 65–99)
GLUCOSE-CAPILLARY: 149 mg/dL — AB (ref 65–99)
GLUCOSE-CAPILLARY: 125 mg/dL — AB (ref 65–99)
Glucose-Capillary: 121 mg/dL — ABNORMAL HIGH (ref 65–99)
Glucose-Capillary: 141 mg/dL — ABNORMAL HIGH (ref 65–99)
Glucose-Capillary: 142 mg/dL — ABNORMAL HIGH (ref 65–99)
Glucose-Capillary: 166 mg/dL — ABNORMAL HIGH (ref 65–99)
Glucose-Capillary: 97 mg/dL (ref 65–99)

## 2017-05-28 LAB — BLOOD GAS, ARTERIAL
ACID-BASE DEFICIT: 5.6 mmol/L — AB (ref 0.0–2.0)
BICARBONATE: 20.1 mmol/L (ref 20.0–28.0)
Drawn by: 418751
O2 CONTENT: 3 L/min
O2 SAT: 96.3 %
PATIENT TEMPERATURE: 98.6
PCO2 ART: 44.7 mmHg (ref 32.0–48.0)
PO2 ART: 99 mmHg (ref 83.0–108.0)
pH, Arterial: 7.275 — ABNORMAL LOW (ref 7.350–7.450)

## 2017-05-28 LAB — CULTURE, RESPIRATORY W GRAM STAIN: Special Requests: NORMAL

## 2017-05-28 LAB — CBC WITH DIFFERENTIAL/PLATELET
BASOS ABS: 0 10*3/uL (ref 0.0–0.1)
BASOS PCT: 0 %
EOS ABS: 0 10*3/uL (ref 0.0–0.7)
EOS PCT: 1 %
HCT: 24.6 % — ABNORMAL LOW (ref 36.0–46.0)
Hemoglobin: 8.1 g/dL — ABNORMAL LOW (ref 12.0–15.0)
LYMPHS PCT: 4 %
Lymphs Abs: 0.3 10*3/uL — ABNORMAL LOW (ref 0.7–4.0)
MCH: 28.1 pg (ref 26.0–34.0)
MCHC: 32.9 g/dL (ref 30.0–36.0)
MCV: 85.4 fL (ref 78.0–100.0)
Monocytes Absolute: 0.4 10*3/uL (ref 0.1–1.0)
Monocytes Relative: 6 %
Neutro Abs: 5.6 10*3/uL (ref 1.7–7.7)
Neutrophils Relative %: 90 %
PLATELETS: 109 10*3/uL — AB (ref 150–400)
RBC: 2.88 MIL/uL — AB (ref 3.87–5.11)
RDW: 16.3 % — ABNORMAL HIGH (ref 11.5–15.5)
WBC: 6.3 10*3/uL (ref 4.0–10.5)

## 2017-05-28 LAB — RENAL FUNCTION PANEL
Albumin: 2 g/dL — ABNORMAL LOW (ref 3.5–5.0)
Anion gap: 11 (ref 5–15)
BUN: 33 mg/dL — ABNORMAL HIGH (ref 6–20)
CALCIUM: 7.1 mg/dL — AB (ref 8.9–10.3)
CHLORIDE: 100 mmol/L — AB (ref 101–111)
CO2: 22 mmol/L (ref 22–32)
CREATININE: 1.42 mg/dL — AB (ref 0.44–1.00)
GFR, EST AFRICAN AMERICAN: 39 mL/min — AB (ref >60)
GFR, EST NON AFRICAN AMERICAN: 34 mL/min — AB (ref >60)
Glucose, Bld: 150 mg/dL — ABNORMAL HIGH (ref 65–99)
Phosphorus: 2.6 mg/dL (ref 2.5–4.6)
Potassium: 3.7 mmol/L (ref 3.5–5.1)
Sodium: 133 mmol/L — ABNORMAL LOW (ref 135–145)

## 2017-05-28 LAB — POCT I-STAT, CHEM 8
BUN: 31 mg/dL — ABNORMAL HIGH (ref 6–20)
CALCIUM ION: 1.03 mmol/L — AB (ref 1.15–1.40)
CHLORIDE: 99 mmol/L — AB (ref 101–111)
CREATININE: 1.4 mg/dL — AB (ref 0.44–1.00)
GLUCOSE: 157 mg/dL — AB (ref 65–99)
HCT: 21 % — ABNORMAL LOW (ref 36.0–46.0)
HEMOGLOBIN: 7.1 g/dL — AB (ref 12.0–15.0)
POTASSIUM: 3.2 mmol/L — AB (ref 3.5–5.1)
Sodium: 133 mmol/L — ABNORMAL LOW (ref 135–145)
TCO2: 22 mmol/L (ref 0–100)

## 2017-05-28 LAB — POCT I-STAT 3, VENOUS BLOOD GAS (G3P V)
Acid-base deficit: 4 mmol/L — ABNORMAL HIGH (ref 0.0–2.0)
BICARBONATE: 20.2 mmol/L (ref 20.0–28.0)
O2 SAT: 53 %
TCO2: 21 mmol/L (ref 0–100)
pCO2, Ven: 31.9 mmHg — ABNORMAL LOW (ref 44.0–60.0)
pH, Ven: 7.408 (ref 7.250–7.430)
pO2, Ven: 27 mmHg — CL (ref 32.0–45.0)

## 2017-05-28 LAB — MAGNESIUM: MAGNESIUM: 2 mg/dL (ref 1.7–2.4)

## 2017-05-28 LAB — CULTURE, RESPIRATORY

## 2017-05-28 LAB — LACTIC ACID, PLASMA: LACTIC ACID, VENOUS: 1.9 mmol/L (ref 0.5–1.9)

## 2017-05-28 MED ORDER — POTASSIUM CHLORIDE 10 MEQ/50ML IV SOLN
10.0000 meq | INTRAVENOUS | Status: AC
  Administered 2017-05-28 (×2): 10 meq via INTRAVENOUS

## 2017-05-28 MED ORDER — ORAL CARE MOUTH RINSE
15.0000 mL | Freq: Two times a day (BID) | OROMUCOSAL | Status: DC
  Administered 2017-05-28 – 2017-05-31 (×6): 15 mL via OROMUCOSAL

## 2017-05-28 MED ORDER — AMIODARONE HCL 200 MG PO TABS
400.0000 mg | ORAL_TABLET | Freq: Two times a day (BID) | ORAL | Status: DC
  Administered 2017-05-28 – 2017-05-29 (×2): 400 mg
  Filled 2017-05-28 (×3): qty 2

## 2017-05-28 MED ORDER — CEFTRIAXONE SODIUM 2 G IJ SOLR
2.0000 g | INTRAMUSCULAR | Status: DC
Start: 2017-05-28 — End: 2017-05-29
  Administered 2017-05-28: 2 g via INTRAVENOUS
  Filled 2017-05-28 (×2): qty 2

## 2017-05-28 MED ORDER — CHLORHEXIDINE GLUCONATE 0.12 % MT SOLN
15.0000 mL | Freq: Two times a day (BID) | OROMUCOSAL | Status: DC
  Administered 2017-05-28 – 2017-05-30 (×5): 15 mL via OROMUCOSAL
  Filled 2017-05-28 (×3): qty 15

## 2017-05-28 MED ORDER — ASPIRIN 81 MG PO CHEW
162.0000 mg | CHEWABLE_TABLET | Freq: Every day | ORAL | Status: DC
  Administered 2017-05-28 – 2017-06-10 (×13): 162 mg
  Filled 2017-05-28 (×14): qty 2

## 2017-05-28 MED ORDER — POTASSIUM CHLORIDE 10 MEQ/50ML IV SOLN
INTRAVENOUS | Status: AC
  Filled 2017-05-28: qty 100

## 2017-05-28 MED ORDER — HYDROCORTISONE NA SUCCINATE PF 100 MG IJ SOLR
50.0000 mg | Freq: Two times a day (BID) | INTRAMUSCULAR | Status: DC
  Administered 2017-05-28 – 2017-06-04 (×14): 50 mg via INTRAVENOUS
  Filled 2017-05-28 (×14): qty 2

## 2017-05-28 MED ORDER — SODIUM BICARBONATE 8.4 % IV SOLN
50.0000 meq | Freq: Once | INTRAVENOUS | Status: AC
  Administered 2017-05-28: 50 meq via INTRAVENOUS
  Filled 2017-05-28: qty 50

## 2017-05-28 MED ORDER — FUROSEMIDE 10 MG/ML IJ SOLN
20.0000 mg | Freq: Once | INTRAMUSCULAR | Status: AC
  Administered 2017-05-28: 20 mg via INTRAVENOUS
  Filled 2017-05-28: qty 2

## 2017-05-28 NOTE — Progress Notes (Addendum)
[facility] Progress Note Patient Name: Brooke Keller DOB: 09/15/1935 MRN: [id number]   Date of Service  05/28/2017  HPI/Events of Note  Nurse says patient is more SOB and had to increase O2 from 1 L/min to 3 L/min. She actually looks comfortable by video, sat = 100% and RR = 17. Hx of OSA on home BiPAP.   eICU Interventions  Will order: 1. Portable CXR STAT. 2. ABG STAT.  3. Portable CXR STAT.     Intervention Category Major Interventions: Hypoxemia - evaluation and management  Lysle Dingwall 05/28/2017, 7:49 PM

## 2017-05-28 NOTE — Progress Notes (Signed)
[place] Progress Note Patient Name: SHANEKA EFAW DOB: 06/16/1935 MRN: [id number]   Date of Service  05/28/2017  HPI/Events of Note    eICU Interventions  Hypokalemia -repleted      Intervention Category Intermediate Interventions: Electrolyte abnormality - evaluation and management  ALVA,RAKESH V. 05/28/2017, 12:55 AM

## 2017-05-28 NOTE — Progress Notes (Signed)
[place] Progress Note Patient Name: Brooke Keller DOB: June 21, 1935 MRN: [id number]   Date of Service  05/28/2017  HPI/Events of Note  ABG on 3 L/min [place] O2 = 7.28/49/99/20.7.  eICU Interventions  Will order: 1. NaHCO3 50 meq IV now.      Intervention Category Major Interventions: Acid-Base disturbance - evaluation and management  Sommer,Steven Cornelia Copa 05/28/2017, 8:34 PM

## 2017-05-28 NOTE — Procedures (Signed)
Extubation Procedure Note  Patient Details:   Name: JOYE WESENBERG DOB: [birth year] MRN: [id number]   Airway Documentation:     Evaluation  O2 sats: stable throughout Complications: No apparent complications Patient did tolerate procedure well.    Yes pt able to vocalize.   Pt extubated at this time per MD order and placed on 4L [place]. Pt was able to breathe around deflated cuff. Pt had strong, adequate cough. No stridor noted at this time.   Irineo Axon [facility] 05/28/2017, 8:36 AM

## 2017-05-28 NOTE — Progress Notes (Signed)
1945  MD Sommers camera'd in on room to check on patient. Patient notably breathing labored on exam with clear and diminished lung sounds. Patient complaining of feeling like she is suffocating. O2 increased from 1L to 3L although patients O2 sats were 100%. Patient was repositioned and orders were placed by MD Sommers.  2030 MD Sommers called with results of ABG. Orders received. Will continue to monitor patient and update MD as needed.  Levon Hedger, RN

## 2017-05-28 NOTE — Progress Notes (Addendum)
  Progress Note    05/28/2017 9:00 AM 8 Days Post-Op  Subjective:  Extubated this morning.  Afebrile HR 785'Y-850'Y systolic HR 77'A-12'I NSR 100% 4LO2NC  Vitals:   05/28/17 0744 05/28/17 0820  BP:  (!) 118/105  Pulse:  79  Resp:  (!) 28  Temp: 97.6 F (36.4 C)   SpO2:  100%    Physical Exam: Cardiac:  regular Lungs:  Non labored; extubated this am and on 4LO2NC Incisions:  Right chest incision is clean and dry; bilateral groin incisions clean and dry with dry gauze in place. Extremities:  Monophasic doppler signals bilateral PT signals Abdomen:  Soft but a little distended  CBC    Component Value Date/Time   WBC 6.3 05/28/2017 0349   RBC 2.88 (L) 05/28/2017 0349   HGB 8.1 (L) 05/28/2017 0349   HCT 24.6 (L) 05/28/2017 0349   PLT 109 (L) 05/28/2017 0349   MCV 85.4 05/28/2017 0349   MCH 28.1 05/28/2017 0349   MCHC 32.9 05/28/2017 0349   RDW 16.3 (H) 05/28/2017 0349   LYMPHSABS 0.3 (L) 05/28/2017 0349   MONOABS 0.4 05/28/2017 0349   EOSABS 0.0 05/28/2017 0349   BASOSABS 0.0 05/28/2017 0349    BMET    Component Value Date/Time   NA 133 (L) 05/28/2017 0349   K 3.7 05/28/2017 0349   CL 100 (L) 05/28/2017 0349   CO2 22 05/28/2017 0349   GLUCOSE 150 (H) 05/28/2017 0349   BUN 33 (H) 05/28/2017 0349   CREATININE 1.42 (H) 05/28/2017 0349   CALCIUM 7.1 (L) 05/28/2017 0349   GFRNONAA 34 (L) 05/28/2017 0349   GFRAA 39 (L) 05/28/2017 0349    INR    Component Value Date/Time   INR 1.06 05/07/2017 0944     Intake/Output Summary (Last 24 hours) at 05/28/17 0900 Last data filed at 05/28/17 0700  Gross per 24 hour  Intake           3179.8 ml  Output             1130 ml  Net           2049.8 ml     Assessment:  82 y.o. female is s/p:  Right axillobifemoral bypass graft   8 Days Post-Op  Plan: -extubated this morning  -monophasic PT doppler signals bilaterally -groins look okay-continue dry gauze to bilateral groins-discussed with pt and family the  importance of dry gauze -DVT prophylaxis:  SQ heparin -appreciate CCM assistance with this pt -leg elevation as tolerated-discussed with RN   Leontine Locket, PA-C [facility] [phone] 05/28/2017 9:00 AM  I have independently interviewed and examined patient and agree with PA assessment and plan above.   Derrico Zhong C. Donzetta Matters, MD [facility] [place] Office: [phone] Pager: [phone]

## 2017-05-28 NOTE — Progress Notes (Signed)
[facility] Critical [facility] Note  ADMISSION DATE:  05/20/2017  CONSULTATION DATE: 05/24/2017  REFERRING MD:  Servando Snare, M.D. / Vascular Surgery   Presenting HPI:  82 y.o. female with history of COPD, chronic hypoxic respiratory failure, diabetes mellitus type 2, congestive heart failure, hypertension, and multiple myeloma. Patient underwent bypass surgery for non-healing ulcer of the ankle. Patient developed subsequent abdominal distention and ileus but refused NG tube placement. Patient became more altered and hypoxic overnight with nausea and vomiting. Patient had subsequent probable aspiration. Patient was noted to be in atrial fibrillation with rapid ventricular response and hypertensive. Metoprolol and Lasix were administered. Patient was transferred to the ICU and placed on noninvasive positive pressure ventilation. Patient's respiratory status remained marginal at best and ultimately she was endotracheally intubated.  Subjective:  Still no acute events overnight. Patient weaned down to pressure support 5/5 yesterday. Is having some endotracheal secretions today. Started having bowel movement last 24 hours.  Review of Systems:  Unable to obtain with patient intubated.  Vent Mode: PRVC FiO2 (%):  [30 %] 30 % Set Rate:  [30 bmp] 30 bmp Vt Set:  [340 mL] 340 mL PEEP:  [5 cmH20] 5 cmH20 Pressure Support:  [5 cmH20] 5 cmH20 Plateau Pressure:  [8 cmH20-18 cmH20] 18 cmH20  Temp:  [97.9 F (36.6 C)-98.7 F (37.1 C)] 97.9 F (36.6 C) (08/15 0330) Pulse Rate:  [34-111] 59 (08/15 0700) Resp:  [20-61] 30 (08/14 1834) BP: (93-165)/(36-75) 143/57 (08/15 0700) SpO2:  [63 %-100 %] 100 % (08/15 0700) FiO2 (%):  [30 %] 30 % (08/15 0400)  General: Elderly female. No distress. No family at bedside. HEENT: Moist mucous membranes. No scleral icterus. Endotracheal tube in place. Neuro: Moving all 4 extremities equally. Nods to questions. Attends to voice. CV: Regular rate. Irregular rhythm.  Atrial fibrillation on telemetry. PULM: Coarse breath sounds bilaterally. Mildly increased work of breathing on pressure support 0/5. GI: Soft. Normal bowel sounds. Nondistended. Skin: Warm. Dry. No rash on exposed skin.  LINES/TUBES: OETT 8/11 >> L [place] CVL 8/11 >> IMPLANTED PORT >> Foley >> OGT >> PIV  CBC Latest Ref Rng & Units 05/28/2017 05/28/2017 05/27/2017  WBC 4.0 - 10.5 K/uL 6.3 - 2.5(L)  Hemoglobin 12.0 - 15.0 g/dL 8.1(L) 7.1(L) 7.5(L)  Hematocrit 36.0 - 46.0 % 24.6(L) 21.0(L) 22.8(L)  Platelets 150 - 400 K/uL 109(L) - 104(L)   BMP Latest Ref Rng & Units 05/28/2017 05/28/2017 05/27/2017  Glucose 65 - 99 mg/dL 150(H) 157(H) 178(H)  BUN 6 - 20 mg/dL 33(H) 31(H) 35(H)  Creatinine 0.44 - 1.00 mg/dL 1.42(H) 1.40(H) 1.42(H)  Sodium 135 - 145 mmol/L 133(L) 133(L) 133(L)  Potassium 3.5 - 5.1 mmol/L 3.7 3.2(L) 3.1(L)  Chloride 101 - 111 mmol/L 100(L) 99(L) 99(L)  CO2 22 - 32 mmol/L 22 - 24  Calcium 8.9 - 10.3 mg/dL 7.1(L) - 7.0(L)   Hepatic Function Latest Ref Rng & Units 05/28/2017 05/27/2017 05/26/2017  Total Protein 6.5 - 8.1 g/dL - - -  Albumin 3.5 - 5.0 g/dL 2.0(L) 2.0(L) 2.3(L)  AST 15 - 41 U/L - - -  ALT 14 - 54 U/L - - -  Alk Phosphatase 38 - 126 U/L - - -  Total Bilirubin 0.3 - 1.2 mg/dL - - -    IMAGING/STUDIES: CT CHEST W/O 8/11: IMPRESSION: 1. Dense consolidation of the right lower lobe, and patchy airspace opacities within the remainder of the right lung and left lower lobe, compatible with multifocal pneumonia. 2. Trace right-sided pleural  fluid noted. 3. Diffuse coronary artery calcifications noted. 4. An incidental finding of potential clinical significance has been found. Aneurysmal dilatation of the infrarenal abdominal aorta to 3.3 cm in AP dimension. Recommend followup by ultrasound in 3 years. This recommendation follows ACR consensus guidelines: White Paper of the ACR Incidental Findings Committee II on Vascular Findings. J Am Coll Radiol 2013; 50:277-412 5.  Diffuse aortic atherosclerosis. 6. 1.2 cm nonspecific hypodensity at the right hepatic lobe. 7. Chronic compression deformities at T4, T5, T6, T7, T10 and L1. PORT ABD X-RAY 8/12:  Decreased small bowel dilatation since previous study. PORT CXR 8/12:  Right lower lobe opacity with chronic elevation of right hemidiaphragm. Somewhat lordotic and rotated film. Endotracheal tube in good position. Left central venous catheter in good position. Enteric feeding tube coursing below diaphragm. PORT CXR 8/14:  Previously reviewed by me. Persistent patchy bilateral opacities, right predominant, relatively unchanged when compared with x-ray imaging from 8/12. No new pleural effusion. Endotracheal tube and left central venous catheter in good position. Enteric feeding tube coursing below diaphragm.  MICROBIOLOGY: MRSA PCR 7/25:  Positive  Blood Cultures x2 8/12 >> Urine Culture 8/12:  Negative  Tracheal Aspirate Culture 8/12:  Moderate Klebsiella pneumoniae   ANTIBIOTICS: Zyvox 8/11 - 8/14 Zosyn 8/11 - 8/15 Rocephin 8/15 >>>  SIGNIFICANT EVENTS: 08/07 - Admit 08/11 - Transferred to ICU w/ respiratory failure & atrial fibrillation >> failed BiPAP & was intubated 08/12 - Tolerated 8 hours of PS 12/5 weaning  08/14 - Bowel movements restarted  ASSESSMENT/PLAN:    PULMONARY A: Acute hypoxic respiratory failure: Secondary to aspiration. History of COPD  P:   Spontaneous breathing trial this morning Continuing Duonebs every 6 hours Possible extubation  CARDIOVASCULAR A:  Shock: Likely secondary to sepsis. Resolved. Peripheral vascular disease: Status post bypass. Atrial fibrillation:  New diagnosis.  P:  Monitoring vitals per protocol Goal MAP >65 & SBP >90 Continue telemetry monitoring Continuing amiodarone drip Holding Lasix Postoperative care per vascular surgery Continuing aspirin 162 mg per tube daily  RENAL A:   Acute on chronic renal failure: Resolving. Hypokalemia:  Resolved.  P:   Continuing to trend urine output with Foley Monitor electrolytes and renal function daily Avoiding nephrotoxic agents  GASTROINTESTINAL A:   Ileus: Resolving.  P:   NPO Continuing NG tube to low intermittent suction  HEMATOLOGIC/ONCOLOGIC A:   Pancytopenia:  Improving. Secondary to Zyvox versus sepsis.Possibly due to sepsis. H/O IgG Multiple Myeloma  P:  Continuing to trend some counts daily with CBC Holding treatment for multiple myeloma Transfusing for Hgb <7.0 or active bleeding  INFECTIOUS A:   Sepsis Klebsiella Pneumonia:  Secondary to aspiration.  P:   Discontinuing Zosyn Starting Rocephin given sensitivities Awaiting finalization blood cultures  ENDOCRINE A:   Diabetes Mellitus:  Glucose control. Chronic Prednisone    P:   Continuing Accu-Cheks every 4 hours Continuing insulin dosed per moderate algorithm Decreasing hydrocortisone to IV every 12 hours Holding home Prednisone & Metformin  NEUROLOGIC A:   Sedation on Ventilator Chronic Benzodiazepine Use  P:   RASS goal: 0 to -1 Discontinuing Precedex drip Fentanyl IV prn Versed IV prn  Prophylaxis:  Protonix IV daily, SCDs, & heparin subcutaneous every 8 hours. Diet:  NPO. No tube feedings. Code Status:  Full Code per previous physician discussions. Disposition:  Remains critically ill in the ICU. Family Update:  Family updated at bedside on 8/14. No family at bedside this morning.  DISCUSSION:  82 y.o. female with aspiration pneumonia.  Antibiotics consolidated to Rocephin today. Tolerating spontaneous breathing trial today. Plan for extubation with close monitoring. Continuing amiodarone drip for atrial fibrillation for now.  Remainder of care as per primary service and other consultants.  I have spent a total of 32 minutes of critical care time today caring for the patient and reviewing the patient's electronic medical record.   Sonia Baller Ashok Cordia, M.D. [facility] Pulmonary &  Critical Care Pager:  [phone] After 3pm or if no response, call [phone] 05/28/2017, 7:33 AM

## 2017-05-29 LAB — GLUCOSE, CAPILLARY
GLUCOSE-CAPILLARY: 64 mg/dL — AB (ref 65–99)
GLUCOSE-CAPILLARY: 118 mg/dL — AB (ref 65–99)
GLUCOSE-CAPILLARY: 88 mg/dL (ref 65–99)
GLUCOSE-CAPILLARY: 91 mg/dL (ref 65–99)
Glucose-Capillary: 93 mg/dL (ref 65–99)
Glucose-Capillary: 81 mg/dL (ref 65–99)

## 2017-05-29 LAB — CBC WITH DIFFERENTIAL/PLATELET
BASOS ABS: 0 10*3/uL (ref 0.0–0.1)
BASOS PCT: 0 %
EOS ABS: 0 10*3/uL (ref 0.0–0.7)
Eosinophils Relative: 0 %
HCT: 29 % — ABNORMAL LOW (ref 36.0–46.0)
Hemoglobin: 9.1 g/dL — ABNORMAL LOW (ref 12.0–15.0)
Lymphocytes Relative: 5 %
Lymphs Abs: 0.7 10*3/uL (ref 0.7–4.0)
MCH: 28.3 pg (ref 26.0–34.0)
MCHC: 31.4 g/dL (ref 30.0–36.0)
MCV: 90.1 fL (ref 78.0–100.0)
MONO ABS: 0.5 10*3/uL (ref 0.1–1.0)
MONOS PCT: 4 %
NEUTROS ABS: 12.3 10*3/uL — AB (ref 1.7–7.7)
Neutrophils Relative %: 91 %
PLATELETS: 115 10*3/uL — AB (ref 150–400)
RBC: 3.22 MIL/uL — ABNORMAL LOW (ref 3.87–5.11)
RDW: 17.2 % — AB (ref 11.5–15.5)
WBC: 14.1 10*3/uL — ABNORMAL HIGH (ref 4.0–10.5)

## 2017-05-29 LAB — RENAL FUNCTION PANEL
ALBUMIN: 2.2 g/dL — AB (ref 3.5–5.0)
Anion gap: 17 — ABNORMAL HIGH (ref 5–15)
BUN: 41 mg/dL — AB (ref 6–20)
CALCIUM: 6.9 mg/dL — AB (ref 8.9–10.3)
CO2: 18 mmol/L — ABNORMAL LOW (ref 22–32)
CREATININE: 1.78 mg/dL — AB (ref 0.44–1.00)
Chloride: 102 mmol/L (ref 101–111)
GFR calc Af Amer: 30 mL/min — ABNORMAL LOW (ref >60)
GFR, EST NON AFRICAN AMERICAN: 26 mL/min — AB (ref >60)
GLUCOSE: 154 mg/dL — AB (ref 65–99)
PHOSPHORUS: 5.3 mg/dL — AB (ref 2.5–4.6)
Potassium: 3.1 mmol/L — ABNORMAL LOW (ref 3.5–5.1)
SODIUM: 137 mmol/L (ref 135–145)

## 2017-05-29 LAB — LACTIC ACID, PLASMA
LACTIC ACID, VENOUS: 1.2 mmol/L (ref 0.5–1.9)
Lactic Acid, Venous: 1 mmol/L (ref 0.5–1.9)
Lactic Acid, Venous: 1.1 mmol/L (ref 0.5–1.9)

## 2017-05-29 LAB — MAGNESIUM: MAGNESIUM: 2.3 mg/dL (ref 1.7–2.4)

## 2017-05-29 MED ORDER — ACETAMINOPHEN 160 MG/5ML PO SOLN
325.0000 mg | ORAL | Status: DC | PRN
  Administered 2017-06-04 – 2017-06-09 (×4): 650 mg via ORAL
  Filled 2017-05-29 (×4): qty 20.3

## 2017-05-29 MED ORDER — ACETAMINOPHEN 325 MG RE SUPP
325.0000 mg | RECTAL | Status: DC | PRN
  Filled 2017-05-29: qty 2

## 2017-05-29 MED ORDER — DEXTROSE 50 % IV SOLN
INTRAVENOUS | Status: AC
  Administered 2017-05-29: 50 mL
  Filled 2017-05-29: qty 50

## 2017-05-29 MED ORDER — SENNOSIDES 8.8 MG/5ML PO SYRP
5.0000 mL | ORAL_SOLUTION | Freq: Once | ORAL | Status: AC
  Administered 2017-05-29: 5 mL
  Filled 2017-05-29: qty 5

## 2017-05-29 MED ORDER — FENTANYL CITRATE (PF) 100 MCG/2ML IJ SOLN
25.0000 ug | INTRAMUSCULAR | Status: DC | PRN
  Administered 2017-05-29 – 2017-06-08 (×8): 25 ug via INTRAVENOUS
  Filled 2017-05-29 (×9): qty 2

## 2017-05-29 MED ORDER — DEXTROSE 5 % IV SOLN
1.0000 g | INTRAVENOUS | Status: DC
  Administered 2017-05-29 – 2017-06-02 (×5): 1 g via INTRAVENOUS
  Filled 2017-05-29 (×6): qty 1

## 2017-05-29 MED ORDER — POTASSIUM CHLORIDE 10 MEQ/100ML IV SOLN
10.0000 meq | INTRAVENOUS | Status: DC
  Filled 2017-05-29: qty 100

## 2017-05-29 MED ORDER — POTASSIUM CHLORIDE 10 MEQ/50ML IV SOLN
10.0000 meq | INTRAVENOUS | Status: AC
  Administered 2017-05-29 (×5): 10 meq via INTRAVENOUS

## 2017-05-29 NOTE — Progress Notes (Signed)
PT Cancellation Note  Patient Details Name: Brooke Keller MRN: [id number] DOB: 1935-03-13   Cancelled Treatment:    Reason Eval/Treat Not Completed: Medical issues which prohibited therapy (PT order discontinued 8/12 with reorder 8/15. Pt however just returned to [place] per RN and not medically appropriate at this time)   General Wearing [name] 05/29/2017, 10:06 AM  Elwyn Reach, [place]

## 2017-05-29 NOTE — Progress Notes (Addendum)
[facility] Note  ADMISSION DATE:  05/20/2017  CONSULTATION DATE: 05/24/2017  REFERRING MD:  Servando Snare, M.D. / Vascular Surgery   Presenting HPI:  82 y.o. female with history of COPD, chronic hypoxic respiratory failure, diabetes mellitus type 2, congestive heart failure, hypertension, and multiple myeloma. Patient underwent bypass surgery for non-healing ulcer of the ankle. Patient developed subsequent abdominal distention and ileus but refused NG tube placement. Patient became more altered and hypoxic overnight with nausea and vomiting. Patient had subsequent probable aspiration. Patient was noted to be in atrial fibrillation with rapid ventricular response and hypertensive. Metoprolol and Lasix were administered. Patient was transferred to the ICU and placed on noninvasive positive pressure ventilation. Patient's respiratory status remained marginal at best and ultimately she was endotracheally intubated.  Subjective:  Patient extubated yesterday. Patient was hypoglycemic overnight. Patient reports increased dyspnea. Denies any chest pain or pressure.  Review of Systems:  Denies any subjective fever or chills. No abdominal pain or nausea.  FiO2 (%):  [40 %] 40 %  Temp:  [97.6 F (36.4 C)-98.9 F (37.2 C)] 97.8 F (36.6 C) (08/16 0300) Pulse Rate:  [60-81] 71 (08/16 0100) Resp:  [19-28] 27 (08/16 0100) BP: (118-182)/(61-105) 145/64 (08/16 0100) SpO2:  [94 %-100 %] 100 % (08/16 0150) FiO2 (%):  [40 %] 40 % (08/15 0800)  General: Elderly female. Husband at bedside. No acute distress. HEENT: No scleral icterus. No scleral injection. Moist mucous membranes. Neuro: Following commands. Grossly nonfocal. No meningismus. CV: Regular rate. Regular rhythm. Sinus rhythm on telemetry. No appreciated. PULM: Distant breath sounds. Mild to moderately increased work of breathing on nasal cannula. Lungs diminished in the bases. GI: Soft. Nontender. Normal bowel sounds. Skin:  No rash on exposed skin. Warm. Dry.  LINES/TUBES: OETT 8/11 - 8/15 NGT (out 8/16) L [place] CVL 8/11 >> IMPLANTED PORT >> Foley >> PIV  CBC Latest Ref Rng & Units 05/29/2017 05/28/2017 05/28/2017  WBC 4.0 - 10.5 K/uL 14.1(H) 6.3 -  Hemoglobin 12.0 - 15.0 g/dL 9.1(L) 8.1(L) 7.1(L)  Hematocrit 36.0 - 46.0 % 29.0(L) 24.6(L) 21.0(L)  Platelets 150 - 400 K/uL 115(L) 109(L) -   BMP Latest Ref Rng & Units 05/29/2017 05/28/2017 05/28/2017  Glucose 65 - 99 mg/dL 154(H) 150(H) 157(H)  BUN 6 - 20 mg/dL 41(H) 33(H) 31(H)  Creatinine 0.44 - 1.00 mg/dL 1.78(H) 1.42(H) 1.40(H)  Sodium 135 - 145 mmol/L 137 133(L) 133(L)  Potassium 3.5 - 5.1 mmol/L 3.1(L) 3.7 3.2(L)  Chloride 101 - 111 mmol/L 102 100(L) 99(L)  CO2 22 - 32 mmol/L 18(L) 22 -  Calcium 8.9 - 10.3 mg/dL 6.9(L) 7.1(L) -   Hepatic Function Latest Ref Rng & Units 05/29/2017 05/28/2017 05/27/2017  Total Protein 6.5 - 8.1 g/dL - - -  Albumin 3.5 - 5.0 g/dL 2.2(L) 2.0(L) 2.0(L)  AST 15 - 41 U/L - - -  ALT 14 - 54 U/L - - -  Alk Phosphatase 38 - 126 U/L - - -  Total Bilirubin 0.3 - 1.2 mg/dL - - -    IMAGING/STUDIES: CT CHEST W/O 8/11: IMPRESSION: 1. Dense consolidation of the right lower lobe, and patchy airspace opacities within the remainder of the right lung and left lower lobe, compatible with multifocal pneumonia. 2. Trace right-sided pleural fluid noted. 3. Diffuse coronary artery calcifications noted. 4. An incidental finding of potential clinical significance has been found. Aneurysmal dilatation of the infrarenal abdominal aorta to 3.3 cm in AP dimension. Recommend followup by ultrasound in 3 years.  This recommendation follows ACR consensus guidelines: White Paper of the ACR Incidental Findings Committee II on Vascular Findings. J Am Coll Radiol 2013; 06:301-601 5. Diffuse aortic atherosclerosis. 6. 1.2 cm nonspecific hypodensity at the right hepatic lobe. 7. Chronic compression deformities at T4, T5, T6, T7, T10 and L1. PORT ABD X-RAY  8/12:  Decreased small bowel dilatation since previous study. PORT CXR 8/12:  Right lower lobe opacity with chronic elevation of right hemidiaphragm. Somewhat lordotic and rotated film. Endotracheal tube in good position. Left central venous catheter in good position. Enteric feeding tube coursing below diaphragm. PORT CXR 8/14:  Previously reviewed by me. Persistent patchy bilateral opacities, right predominant, relatively unchanged when compared with x-ray imaging from 8/12. No new pleural effusion. Endotracheal tube and left central venous catheter in good position. Enteric feeding tube coursing below diaphragm. PORT CXR 8/15:  Personally reviewed by me. Central venous catheter in good position. Enteric feeding tube coursing below diaphragm. Some slight worsening in bilateral pleural effusions versus atelectasis.  MICROBIOLOGY: MRSA PCR 7/25:  Positive  Blood Cultures x2 8/12 >> Urine Culture 8/12:  Negative  Tracheal Aspirate Culture 8/12:  Moderate Klebsiella pneumoniae & Enterobacter cloacae   ANTIBIOTICS: Zyvox 8/11 - 8/14 Zosyn 8/11 - 8/15 Rocephin 8/15 (x1 dose) Cefepime 8/16 >>>  SIGNIFICANT EVENTS: 08/07 - Admit 08/11 - Transferred to ICU w/ respiratory failure & atrial fibrillation >> failed BiPAP & was intubated 08/12 - Tolerated 8 hours of PS 12/5 weaning  08/14 - Bowel movements restarted 08/16 - Hypoglycemic to 64 overnight. 1amp of Bicarb given for acidosis. Worsening resp status >> removing NGT to allow for BiPAP  ASSESSMENT/PLAN:    PULMONARY A: Acute hypoxic respiratory failure: Secondary to aspiration. Improving. History of COPD  P:   Continuous pulse oximetry monitoring Serum spirometry for pulmonary toilette Weaning FiO2 Continuing Duonebs every 6 hours  Holding on further diuresis given worsening renal function. BiPAP to be applied after NGT is removed  CARDIOVASCULAR A:  Shock: Likely secondary to sepsis. Resolved. Peripheral vascular disease: Status  post bypass. Atrial fibrillation:  New diagnosis.  P:  Goal MAP >65 & SBP >90 Monitoring vitals per unit protocol Holding further diuresis with Lasix Continuing amiodarone via tube twice a day Continuing aspirin via tube daily Continuous telemetry monitoring Postoperative care per vascular surgery  RENAL A:   Acute on chronic renal failure: Slight worsening with diuresis yesterday. Hypokalemia: Replacing. Hyperphosphatemia: Mild. Metabolic acidosis: Mild. Status post 1 amp of sodium bicarbonate last night.  P:   KCl 28mq IV x5 runs Continuing to monitor electrolytes and renal function daily Holding on further diuresis Avoiding nephrotoxic agents Trending urine output with Foley catheter Checking lactic acid  GASTROINTESTINAL A:   Ileus: Resolved.  P:   NPO Discontinuing NGT to provide seal for BiPAP  HEMATOLOGIC/ONCOLOGIC A:   Pancytopenia: Continuing to improve. Secondary to Zyvox versus sepsis. H/O IgG Multiple Myeloma  P:  Trending cell counts daily with CBC Holding treatment for multiple myeloma Transfusing for Hgb <7.0 or active bleeding  INFECTIOUS A:   Sepsis Klebsiella & Enterobacter Pneumonia:  Secondary to aspiration.  P:   Discontinuing Rocephin Starting Cefepime given inducible resistance w/ Enterobacter Trending leukocytosis Awaiting finalization blood cultures  ENDOCRINE A:   Diabetes Mellitus: Hypoglycemic overnight. Chronic Prednisone    P:   Continuing Accu-Cheks every 4 hours Changing to sensitive sliding scale algorithm Continuing hydrocortisone to IV every 12 hours Holding home Prednisone & Metformin  NEUROLOGIC A:   Post-op Pain Chronic Benzodiazepine  Use  P:   Tylenol prn Pain  Fentanyl IV prn Severe Pain Avoiding sedating medications  Prophylaxis:  Protonix IV daily, SCDs, & heparin subcutaneous every 8 hours. Diet:  NPO for now pending improvement in respiratory status. Code Status:  Full Code per previous  physician discussions. Disposition:  Patient to remain in the ICU given tenuous respiratory status and high potential for reintubation. Family Update:  Husband updated at bedside at the time of my exam.  DISCUSSION:  82 y.o. female with aspiration pneumonia. Adjusting antibiotic regimen today given Enterobacter. Respiratory status somewhat worse today. Administer morning medications then discontinuing NG tube to allow for BiPAP mask for respiratory support with increased work of breathing. If patient's respiratory status continues to worsen she may require endotracheal intubation. Holding on further diuresis given worsening renal function.  Remainder of care as per primary service and other consultants.  I have spent a total of 35 minutes of critical care time today caring for the patient, updating her husband at bedside, and reviewing the patient's electronic medical record.   Sonia Baller Ashok Cordia, M.D. [facility] Pager:  [phone] After 3pm or if no response, call [phone] 05/29/2017, 7:59 AM

## 2017-05-29 NOTE — Progress Notes (Signed)
  Progress Note    05/29/2017 7:42 AM 9 Days Post-Op  Subjective:  Wants to drink  Vitals:   05/29/17 0150 05/29/17 0300  BP:    Pulse:    Resp:    Temp:  97.8 F (36.6 C)  SpO2: 100%     Physical Exam: Awake and alert Having increased work of breathing this a.m. Abdomen is soft Right infraclavicular and bilateral groin incisions cdi Strong pt signals  CBC    Component Value Date/Time   WBC 14.1 (H) 05/29/2017 0430   RBC 3.22 (L) 05/29/2017 0430   HGB 9.1 (L) 05/29/2017 0430   HCT 29.0 (L) 05/29/2017 0430   PLT 115 (L) 05/29/2017 0430   MCV 90.1 05/29/2017 0430   MCH 28.3 05/29/2017 0430   MCHC 31.4 05/29/2017 0430   RDW 17.2 (H) 05/29/2017 0430   LYMPHSABS 0.7 05/29/2017 0430   MONOABS 0.5 05/29/2017 0430   EOSABS 0.0 05/29/2017 0430   BASOSABS 0.0 05/29/2017 0430    BMET    Component Value Date/Time   NA 137 05/29/2017 0430   K 3.1 (L) 05/29/2017 0430   CL 102 05/29/2017 0430   CO2 18 (L) 05/29/2017 0430   GLUCOSE 154 (H) 05/29/2017 0430   BUN 41 (H) 05/29/2017 0430   CREATININE 1.78 (H) 05/29/2017 0430   CALCIUM 6.9 (L) 05/29/2017 0430   GFRNONAA 26 (L) 05/29/2017 0430   GFRAA 30 (L) 05/29/2017 0430    INR    Component Value Date/Time   INR 1.06 05/07/2017 0944     Intake/Output Summary (Last 24 hours) at 05/29/17 0742 Last data filed at 05/29/17 0400  Gross per 24 hour  Intake            650.5 ml  Output             1025 ml  Net           -374.5 ml     Assessment:  82 y.o. female is s/p ax-bifem bypass for bilateral foot wounds. Extubated yesterday. Abdomen now soft  Plan: Ng tube clamped this a.m., ok to remove if she tolerates well and she is not having worsening pulmonary function Continue dry dressings to bilateral groins Continue dressing changes to bilateral foot wounds   Kanyia Heaslip C. Donzetta Matters, MD [facility] Office: [phone] Pager: [phone]  05/29/2017 7:42 AM

## 2017-05-29 NOTE — Progress Notes (Signed)
Pharmacy Antibiotic Note  Brooke Keller is a 82 y.o. female admitted on 05/20/2017 with pneumonia. Pt previously on Zosyn and then narrowed to ceftriaxone based on Klebsiella growing in trach culture, however pt is now with worsening work of breathing overnight, elevated WBC, and culture is growing Enterobacter. Pharmacy has been consulted to transition to cefepime.  Plan: -Cefepime 1g IV q24h -Monitor renal function, LOT, cultures  Height: 4\' 7"  (139.7 cm) (per daughter patient is 4'7") Weight: 148 lb 5.9 oz (67.3 kg) IBW/kg (Calculated) : 34  Temp (24hrs), Avg:98 F (36.7 C), Min:97.6 F (36.4 C), Max:98.9 F (37.2 C)   Recent Labs Lab 05/24/17 1842 05/24/17 2107 05/25/17 0343  05/26/17 0609 05/26/17 0616 05/27/17 0340 05/27/17 1618 05/28/17 0039 05/28/17 0349 05/29/17 0430  WBC 12.5*  --  8.8  --  2.9*  --  2.5*  --   --  6.3 14.1*  CREATININE  --   --  1.91*  < > 1.64*  --  1.50* 1.42* 1.40* 1.42* 1.78*  LATICACIDVEN 2.3* 2.2*  --   --   --  2.1* 2.4*  --   --  1.9  --   < > = values in this interval not displayed.  Estimated Creatinine Clearance: 18.8 mL/min (A) (by C-G formula based on SCr of 1.78 mg/dL (H)).    Allergies  Allergen Reactions  . Ativan [Lorazepam] Other (See Comments)    hallucinations  . Amitriptyline Other (See Comments)    HALLUCINATIONS    Antimicrobials this admission: 8/11 Linezolid>>8/14 8/11 Zosyn>>8/15 8/15 CTX >>8/16 8/16 Cefepime >>  Dose adjustments this admission: n/a  Microbiology results: 8/12 TA: moderate Kleb pneumoniae, Enterobacter 8/12 BCx: NGTD   Thank you for allowing pharmacy to be a part of this patient's care.  Arrie Senate, PharmD PGY-2 Cardiology Pharmacy Resident Pager: [phone] 05/29/2017

## 2017-05-30 LAB — CBC WITH DIFFERENTIAL/PLATELET
BASOS ABS: 0 10*3/uL (ref 0.0–0.1)
BASOS PCT: 0 %
Eosinophils Absolute: 0 10*3/uL (ref 0.0–0.7)
Eosinophils Relative: 0 %
HEMATOCRIT: 30.7 % — AB (ref 36.0–46.0)
HEMOGLOBIN: 9.6 g/dL — AB (ref 12.0–15.0)
Lymphocytes Relative: 6 %
Lymphs Abs: 0.5 10*3/uL — ABNORMAL LOW (ref 0.7–4.0)
MCH: 28.5 pg (ref 26.0–34.0)
MCHC: 31.3 g/dL (ref 30.0–36.0)
MCV: 91.1 fL (ref 78.0–100.0)
MONOS PCT: 4 %
Monocytes Absolute: 0.4 10*3/uL (ref 0.1–1.0)
NEUTROS ABS: 8.7 10*3/uL — AB (ref 1.7–7.7)
NEUTROS PCT: 90 %
PLATELETS: 123 10*3/uL — AB (ref 150–400)
RBC: 3.37 MIL/uL — AB (ref 3.87–5.11)
RDW: 17.4 % — AB (ref 11.5–15.5)
WBC: 9.6 10*3/uL (ref 4.0–10.5)

## 2017-05-30 LAB — BASIC METABOLIC PANEL
ANION GAP: 15 (ref 5–15)
BUN: 43 mg/dL — ABNORMAL HIGH (ref 6–20)
CO2: 20 mmol/L — AB (ref 22–32)
Calcium: 7.1 mg/dL — ABNORMAL LOW (ref 8.9–10.3)
Chloride: 107 mmol/L (ref 101–111)
Creatinine, Ser: 1.78 mg/dL — ABNORMAL HIGH (ref 0.44–1.00)
GFR calc Af Amer: 30 mL/min — ABNORMAL LOW (ref >60)
GFR calc non Af Amer: 26 mL/min — ABNORMAL LOW (ref >60)
GLUCOSE: 133 mg/dL — AB (ref 65–99)
POTASSIUM: 3.7 mmol/L (ref 3.5–5.1)
Sodium: 142 mmol/L (ref 135–145)

## 2017-05-30 LAB — GLUCOSE, CAPILLARY
GLUCOSE-CAPILLARY: 131 mg/dL — AB (ref 65–99)
GLUCOSE-CAPILLARY: 114 mg/dL — AB (ref 65–99)
Glucose-Capillary: 106 mg/dL — ABNORMAL HIGH (ref 65–99)
Glucose-Capillary: 106 mg/dL — ABNORMAL HIGH (ref 65–99)
Glucose-Capillary: 121 mg/dL — ABNORMAL HIGH (ref 65–99)
Glucose-Capillary: 129 mg/dL — ABNORMAL HIGH (ref 65–99)
Glucose-Capillary: 132 mg/dL — ABNORMAL HIGH (ref 65–99)

## 2017-05-30 LAB — RENAL FUNCTION PANEL
ANION GAP: 14 (ref 5–15)
Albumin: 2.2 g/dL — ABNORMAL LOW (ref 3.5–5.0)
BUN: 44 mg/dL — ABNORMAL HIGH (ref 6–20)
CO2: 20 mmol/L — ABNORMAL LOW (ref 22–32)
Calcium: 7 mg/dL — ABNORMAL LOW (ref 8.9–10.3)
Chloride: 107 mmol/L (ref 101–111)
Creatinine, Ser: 1.83 mg/dL — ABNORMAL HIGH (ref 0.44–1.00)
GFR calc non Af Amer: 25 mL/min — ABNORMAL LOW (ref >60)
GFR, EST AFRICAN AMERICAN: 29 mL/min — AB (ref >60)
GLUCOSE: 112 mg/dL — AB (ref 65–99)
PHOSPHORUS: 4.7 mg/dL — AB (ref 2.5–4.6)
POTASSIUM: 3.5 mmol/L (ref 3.5–5.1)
Sodium: 141 mmol/L (ref 135–145)

## 2017-05-30 LAB — CULTURE, BLOOD (ROUTINE X 2)
CULTURE: NO GROWTH
CULTURE: NO GROWTH
SPECIAL REQUESTS: ADEQUATE

## 2017-05-30 LAB — MAGNESIUM: Magnesium: 2.1 mg/dL (ref 1.7–2.4)

## 2017-05-30 MED ORDER — ORAL CARE MOUTH RINSE
15.0000 mL | Freq: Two times a day (BID) | OROMUCOSAL | Status: DC
  Administered 2017-05-31: 15 mL via OROMUCOSAL

## 2017-05-30 MED ORDER — AMIODARONE HCL 200 MG PO TABS
400.0000 mg | ORAL_TABLET | Freq: Every day | ORAL | Status: DC
  Administered 2017-05-30 – 2017-06-05 (×5): 400 mg via ORAL
  Filled 2017-05-30 (×5): qty 2

## 2017-05-30 MED ORDER — METOPROLOL TARTRATE 5 MG/5ML IV SOLN
2.5000 mg | Freq: Four times a day (QID) | INTRAVENOUS | Status: DC
  Administered 2017-05-30 – 2017-06-02 (×7): 2.5 mg via INTRAVENOUS
  Filled 2017-05-30 (×8): qty 5

## 2017-05-30 MED ORDER — POTASSIUM CHLORIDE 10 MEQ/50ML IV SOLN
10.0000 meq | INTRAVENOUS | Status: AC
Start: 2017-05-30 — End: 2017-05-30
  Administered 2017-05-30 (×4): 10 meq via INTRAVENOUS
  Filled 2017-05-30 (×4): qty 50

## 2017-05-30 MED ORDER — CHLORHEXIDINE GLUCONATE 0.12 % MT SOLN
15.0000 mL | Freq: Two times a day (BID) | OROMUCOSAL | Status: DC
  Administered 2017-05-31: 15 mL via OROMUCOSAL
  Filled 2017-05-30: qty 15

## 2017-05-30 NOTE — Progress Notes (Signed)
Called Dr. Ashok Cordia to make aware pt HR 110's-120's. Pt been on bipap since 0945. New order received will continue to monitor.

## 2017-05-30 NOTE — Progress Notes (Signed)
[facility] Note  ADMISSION DATE:  05/20/2017  CONSULTATION DATE: 05/24/2017  REFERRING MD:  Servando Snare, M.D. / Vascular Surgery   Presenting HPI:  82 y.o. female with history of COPD, chronic hypoxic respiratory failure, diabetes mellitus type 2, congestive heart failure, hypertension, and multiple myeloma. Patient underwent bypass surgery for non-healing ulcer of the ankle. Patient developed subsequent abdominal distention and ileus but refused NG tube placement. Patient became more altered and hypoxic overnight with nausea and vomiting. Patient had subsequent probable aspiration. Patient was noted to be in atrial fibrillation with rapid ventricular response and hypertensive. Metoprolol and Lasix were administered. Patient was transferred to the ICU and placed on noninvasive positive pressure ventilation. Patient's respiratory status remained marginal at best and ultimately she was endotracheally intubated.  Subjective:  Patient on BiPAP overnight. Husband reports that the patient was somewhat altered overnight. Patient denies any chest pain or pressure. She denies any difficulty breathing on BiPAP.  Review of Systems:  No abdominal pain or nausea. No subjective fever or chills. Further review of systems is difficult to obtain with BiPAP mask in place.  FiO2 (%):  [30 %-40 %] 30 %  Temp:  [93.1 F (33.9 C)-97.8 F (36.6 C)] 96.4 F (35.8 C) (08/17 0818) Pulse Rate:  [52-72] 61 (08/17 0748) Resp:  [19-31] 21 (08/17 0748) BP: (121-161)/(56-99) 148/68 (08/17 0700) SpO2:  [92 %-100 %] 99 % (08/17 0748) FiO2 (%):  [30 %-40 %] 30 % (08/17 0748) Weight:  [145 lb 11.6 oz (66.1 kg)] 145 lb 11.6 oz (66.1 kg) (08/17 0300)  General:  Elderly female. Husband at bedside this morning. No distress. Watching TV. HEENT: No scleral icterus. No scleral injection. BiPAP mask in place. Neuro:  Moving all 4 extremities equally. Grossly nonfocal. No meningismus. CV: Sinus rhythm.  Regular rate. No JVD appreciated. PULM: Normal work of breathing on BiPAP. Good aeration bilaterally. GI:  Hypoactive bowel sounds. Soft. Nontender.   LINES/TUBES: OETT 8/11 - 8/15 NGT (out 8/16) L [place] CVL 8/11 >> IMPLANTED PORT >> Foley >> PIV  CBC Latest Ref Rng & Units 05/30/2017 05/29/2017 05/28/2017  WBC 4.0 - 10.5 K/uL 9.6 14.1(H) 6.3  Hemoglobin 12.0 - 15.0 g/dL 9.6(L) 9.1(L) 8.1(L)  Hematocrit 36.0 - 46.0 % 30.7(L) 29.0(L) 24.6(L)  Platelets 150 - 400 K/uL 123(L) 115(L) 109(L)   BMP Latest Ref Rng & Units 05/30/2017 05/29/2017 05/28/2017  Glucose 65 - 99 mg/dL 112(H) 154(H) 150(H)  BUN 6 - 20 mg/dL 44(H) 41(H) 33(H)  Creatinine 0.44 - 1.00 mg/dL 1.83(H) 1.78(H) 1.42(H)  Sodium 135 - 145 mmol/L 141 137 133(L)  Potassium 3.5 - 5.1 mmol/L 3.5 3.1(L) 3.7  Chloride 101 - 111 mmol/L 107 102 100(L)  CO2 22 - 32 mmol/L 20(L) 18(L) 22  Calcium 8.9 - 10.3 mg/dL 7.0(L) 6.9(L) 7.1(L)   Hepatic Function Latest Ref Rng & Units 05/30/2017 05/29/2017 05/28/2017  Total Protein 6.5 - 8.1 g/dL - - -  Albumin 3.5 - 5.0 g/dL 2.2(L) 2.2(L) 2.0(L)  AST 15 - 41 U/L - - -  ALT 14 - 54 U/L - - -  Alk Phosphatase 38 - 126 U/L - - -  Total Bilirubin 0.3 - 1.2 mg/dL - - -    IMAGING/STUDIES: CT CHEST W/O 8/11: IMPRESSION: 1. Dense consolidation of the right lower lobe, and patchy airspace opacities within the remainder of the right lung and left lower lobe, compatible with multifocal pneumonia. 2. Trace right-sided pleural fluid noted. 3. Diffuse coronary artery calcifications noted.  4. An incidental finding of potential clinical significance has been found. Aneurysmal dilatation of the infrarenal abdominal aorta to 3.3 cm in AP dimension. Recommend followup by ultrasound in 3 years. This recommendation follows ACR consensus guidelines: White Paper of the ACR Incidental Findings Committee II on Vascular Findings. J Am Coll Radiol 2013; 14:431-540 5. Diffuse aortic atherosclerosis. 6. 1.2 cm nonspecific  hypodensity at the right hepatic lobe. 7. Chronic compression deformities at T4, T5, T6, T7, T10 and L1. PORT ABD X-RAY 8/12:  Decreased small bowel dilatation since previous study. PORT CXR 8/12:  Right lower lobe opacity with chronic elevation of right hemidiaphragm. Somewhat lordotic and rotated film. Endotracheal tube in good position. Left central venous catheter in good position. Enteric feeding tube coursing below diaphragm. PORT CXR 8/14:  Previously reviewed by me. Persistent patchy bilateral opacities, right predominant, relatively unchanged when compared with x-ray imaging from 8/12. No new pleural effusion. Endotracheal tube and left central venous catheter in good position. Enteric feeding tube coursing below diaphragm. PORT CXR 8/15:  Previously reviewed by me. Central venous catheter in good position. Enteric feeding tube coursing below diaphragm. Some slight worsening in bilateral pleural effusions versus atelectasis.  MICROBIOLOGY: MRSA PCR 7/25:  Positive  Blood Cultures x2 8/12 >> Urine Culture 8/12:  Negative  Tracheal Aspirate Culture 8/12:  Moderate Klebsiella pneumoniae & Enterobacter cloacae   ANTIBIOTICS: Zyvox 8/11 - 8/14 Zosyn 8/11 - 8/15 Rocephin 8/15 (x1 dose) Cefepime 8/16 >>>  SIGNIFICANT EVENTS: 08/07 - Admit 08/11 - Transferred to ICU w/ respiratory failure & atrial fibrillation >> failed BiPAP & was intubated 08/12 - Tolerated 8 hours of PS 12/5 weaning  08/14 - Bowel movements restarted 08/16 - Hypoglycemic to 64 overnight. 1amp of Bicarb given for acidosis. Increased WOB>>BiPAP. Normal Sinus Rhythm.  ASSESSMENT/PLAN:    PULMONARY A: Acute hypoxic respiratory failure: Secondary to aspiration. Improving. History of COPD  P:   Continuing BiPAP for work of breathing with intermittent breaks Continuous pulse oximetry monitoring Incentive spirometry for pulmonary toilette Weaning FiO2 Continuing Duonebs every 6 hours Holding diuresis given tenuous  renal function  CARDIOVASCULAR A:  Shock: Likely secondary to sepsis. Resolved. Peripheral vascular disease: Status post bypass. Atrial fibrillation:  New diagnosis. Converted to normal sinus rhythm.  P:  Goal MAP >65 & SBP >90 Switching amiodarone to 400 mg daily Holding diuresis with tenuous renal function Continuing aspirin daily Continuous telemetry monitoring Postoperative care per vascular surgery Monitoring vitals per unit protocol  RENAL A:   Acute on chronic renal failure: Stable. Hypokalemia: Resolved. Hyperphosphatemia: Mild. Metabolic acidosis: Mild. Status post 1 amp of sodium bicarbonate 8/16.  P:   KCl 7mq IV x4 runs Monitoring electrolytes and renal function daily Holding on further diuresis Avoiding nephrotoxic agents Trending urine output with Foley catheter   GASTROINTESTINAL A:   Ileus: Resolved.  P:   Ice chips  HEMATOLOGIC/ONCOLOGIC A:   Leukocytosis:  Resolved. Pancytopenia: Resolving. Likely multifactorial from sepsis and the Zyvox. H/O IgG Multiple Myeloma  P:  Trending cell counts daily with CBC Holding treatment for multiple myeloma Transfusing for Hgb <7.0 or active bleeding  INFECTIOUS A:   Sepsis Klebsiella & Enterobacter Pneumonia:  Secondary to aspiration.  P:   Continuing cefepime given inducible resistance with Enterobacter Awaiting finalization of blood cultures Plan reculture for fever   ENDOCRINE A:   Diabetes Mellitus: Hypoglycemic overnight. Chronic Prednisone    P:   Continuing Accu-Cheks every 4 hours Continuing to sensitive sliding scale algorithm Continuing hydrocortisone to IV  every 12 hours Holding home Prednisone & Metformin  NEUROLOGIC A:   Delirium Post-op Pain Chronic Benzodiazepine Use  P:   Tylenol prn Pain  Fentanyl IV prn Severe Pain Avoiding sedating medications  Prophylaxis:  Protonix IV daily, SCDs, & heparin subcutaneous every 8 hours. Diet:  Sips & [employer] today. Code  Status:  Full Code per previous physician discussions. Disposition:  Patient to remain in the ICU given tenuous respiratory status and high potential for reintubation. Family Update:  Husband updated during my exam today.  DISCUSSION:  82 y.o.  female with aspiration pneumonia and acute hypoxic respiratory failure. Continuing intermittent noninvasive positive pressure ventilation for increased work of breathing. Remains tenuous. High risk for reintubation with intermittent delirium. Continuing to monitor closely in the intensive care unit.  Remainder of care as per primary service and other consultants.  I have spent a total of 32 minutes of critical care time today caring for the patient, updating her husband at bedside, and reviewing the patient's electronic medical record.   Sonia Baller Ashok Cordia, M.D. [facility] Critical Care Pager:  [phone] After 3pm or if no response, call [phone] 05/30/2017, 8:48 AM

## 2017-05-30 NOTE — Progress Notes (Signed)
PT Cancellation Note  Patient Details Name: LORAN AUGUSTE MRN: [id number] DOB: 1935/06/30   Cancelled Treatment:    Reason Eval/Treat Not Completed: Medical issues which prohibited therapy (Pt cannot wean off Bipap. Nurse asked to HOLD PT.)   Denice Paradise 05/30/2017, 10:43 AM  [facility] [phone] (pager)

## 2017-05-30 NOTE — Progress Notes (Signed)
  Progress Note    05/30/2017 7:39 AM 10 Days Post-Op  Subjective:  Stable overnight on bipap Wants to drink  Vitals:   05/30/17 0600 05/30/17 0700  BP: 135/76 (!) 148/68  Pulse: (!) 56 69  Resp: (!) 21 (!) 23  Temp:    SpO2: 100% 100%    Physical Exam: Awake and alert On bipap Abdomen is soft Right infraclavicular and bilateral groin incisions cdi Strong pt signals Dressings to both feet are cdi  CBC    Component Value Date/Time   WBC 9.6 05/30/2017 0332   RBC 3.37 (L) 05/30/2017 0332   HGB 9.6 (L) 05/30/2017 0332   HCT 30.7 (L) 05/30/2017 0332   PLT 123 (L) 05/30/2017 0332   MCV 91.1 05/30/2017 0332   MCH 28.5 05/30/2017 0332   MCHC 31.3 05/30/2017 0332   RDW 17.4 (H) 05/30/2017 0332   LYMPHSABS 0.5 (L) 05/30/2017 0332   MONOABS 0.4 05/30/2017 0332   EOSABS 0.0 05/30/2017 0332   BASOSABS 0.0 05/30/2017 0332    BMET    Component Value Date/Time   NA 141 05/30/2017 0332   K 3.5 05/30/2017 0332   CL 107 05/30/2017 0332   CO2 20 (L) 05/30/2017 0332   GLUCOSE 112 (H) 05/30/2017 0332   BUN 44 (H) 05/30/2017 0332   CREATININE 1.83 (H) 05/30/2017 0332   CALCIUM 7.0 (L) 05/30/2017 0332   GFRNONAA 25 (L) 05/30/2017 0332   GFRAA 29 (L) 05/30/2017 0332    INR    Component Value Date/Time   INR 1.06 05/07/2017 0944     Intake/Output Summary (Last 24 hours) at 05/30/17 0739 Last data filed at 05/30/17 0700  Gross per 24 hour  Intake              580 ml  Output             1740 ml  Net            -1160 ml    Assessment:  82 y.o. female is s/p ax-bifem bypass for bilateral foot wounds.Now on bipap and ng tube is out.   Plan: Continue dry dressings to bilateral groins Continue dressing changes to bilateral foot wounds Should be able to have sips when off bipap    Ovella Manygoats C. Donzetta Matters, MD [facility] Office: [phone] Pager: [phone]  05/30/2017 7:39 AM

## 2017-05-30 NOTE — Progress Notes (Signed)
OT Cancellation Note  Patient Details Name: Brooke Keller MRN: [id number] DOB: 1935/02/14   Cancelled Treatment:    Reason Eval/Treat Not Completed: Medical issues which prohibited therapy.  Pt currently on BiPAP and increased HR.  Will check back next week.  [place], OTR/L [phone]   [name] M 05/30/2017, 2:21 PM

## 2017-05-31 ENCOUNTER — Inpatient Hospital Stay (HOSPITAL_COMMUNITY): Payer: Medicare Other

## 2017-05-31 DIAGNOSIS — R0600 Dyspnea, unspecified: Secondary | ICD-10-CM

## 2017-05-31 LAB — GLUCOSE, CAPILLARY
Glucose-Capillary: 106 mg/dL — ABNORMAL HIGH (ref 65–99)
Glucose-Capillary: 145 mg/dL — ABNORMAL HIGH (ref 65–99)
Glucose-Capillary: 103 mg/dL — ABNORMAL HIGH (ref 65–99)
Glucose-Capillary: 122 mg/dL — ABNORMAL HIGH (ref 65–99)

## 2017-05-31 LAB — RENAL FUNCTION PANEL
ANION GAP: 13 (ref 5–15)
Albumin: 2.2 g/dL — ABNORMAL LOW (ref 3.5–5.0)
BUN: 41 mg/dL — AB (ref 6–20)
CHLORIDE: 110 mmol/L (ref 101–111)
CO2: 21 mmol/L — AB (ref 22–32)
Calcium: 7.4 mg/dL — ABNORMAL LOW (ref 8.9–10.3)
Creatinine, Ser: 1.62 mg/dL — ABNORMAL HIGH (ref 0.44–1.00)
GFR calc Af Amer: 33 mL/min — ABNORMAL LOW (ref >60)
GFR calc non Af Amer: 29 mL/min — ABNORMAL LOW (ref >60)
GLUCOSE: 130 mg/dL — AB (ref 65–99)
PHOSPHORUS: 3.7 mg/dL (ref 2.5–4.6)
POTASSIUM: 3.4 mmol/L — AB (ref 3.5–5.1)
Sodium: 144 mmol/L (ref 135–145)

## 2017-05-31 LAB — CBC WITH DIFFERENTIAL/PLATELET
BASOS PCT: 0 %
Basophils Absolute: 0 10*3/uL (ref 0.0–0.1)
Eosinophils Absolute: 0 10*3/uL (ref 0.0–0.7)
Eosinophils Relative: 0 %
HEMATOCRIT: 29.8 % — AB (ref 36.0–46.0)
HEMOGLOBIN: 9.5 g/dL — AB (ref 12.0–15.0)
LYMPHS ABS: 0.5 10*3/uL — AB (ref 0.7–4.0)
LYMPHS PCT: 6 %
MCH: 29 pg (ref 26.0–34.0)
MCHC: 31.9 g/dL (ref 30.0–36.0)
MCV: 90.9 fL (ref 78.0–100.0)
MONO ABS: 0.5 10*3/uL (ref 0.1–1.0)
MONOS PCT: 6 %
NEUTROS ABS: 7.1 10*3/uL (ref 1.7–7.7)
NEUTROS PCT: 88 %
Platelets: 121 10*3/uL — ABNORMAL LOW (ref 150–400)
RBC: 3.28 MIL/uL — ABNORMAL LOW (ref 3.87–5.11)
RDW: 17.9 % — ABNORMAL HIGH (ref 11.5–15.5)
WBC: 8 10*3/uL (ref 4.0–10.5)

## 2017-05-31 LAB — BASIC METABOLIC PANEL
ANION GAP: 14 (ref 5–15)
BUN: 38 mg/dL — AB (ref 6–20)
CALCIUM: 7.5 mg/dL — AB (ref 8.9–10.3)
CO2: 23 mmol/L (ref 22–32)
CREATININE: 1.52 mg/dL — AB (ref 0.44–1.00)
Chloride: 107 mmol/L (ref 101–111)
GFR calc Af Amer: 36 mL/min — ABNORMAL LOW (ref >60)
GFR, EST NON AFRICAN AMERICAN: 31 mL/min — AB (ref >60)
GLUCOSE: 122 mg/dL — AB (ref 65–99)
Potassium: 3.3 mmol/L — ABNORMAL LOW (ref 3.5–5.1)
Sodium: 144 mmol/L (ref 135–145)

## 2017-05-31 LAB — MAGNESIUM: Magnesium: 2.2 mg/dL (ref 1.7–2.4)

## 2017-05-31 MED ORDER — CHLORHEXIDINE GLUCONATE 0.12 % MT SOLN
15.0000 mL | Freq: Two times a day (BID) | OROMUCOSAL | Status: DC
  Administered 2017-05-31 – 2017-06-01 (×3): 15 mL via OROMUCOSAL
  Filled 2017-05-31 (×2): qty 15

## 2017-05-31 MED ORDER — ORAL CARE MOUTH RINSE
15.0000 mL | Freq: Two times a day (BID) | OROMUCOSAL | Status: DC
  Administered 2017-06-01 (×2): 15 mL via OROMUCOSAL

## 2017-05-31 MED ORDER — POTASSIUM CHLORIDE 10 MEQ/50ML IV SOLN
10.0000 meq | INTRAVENOUS | Status: AC
  Administered 2017-05-31 (×3): 10 meq via INTRAVENOUS
  Filled 2017-05-31 (×3): qty 50

## 2017-05-31 MED ORDER — POTASSIUM CHLORIDE 10 MEQ/50ML IV SOLN
10.0000 meq | INTRAVENOUS | Status: AC
  Administered 2017-05-31 (×4): 10 meq via INTRAVENOUS
  Filled 2017-05-31 (×4): qty 50

## 2017-05-31 MED ORDER — FUROSEMIDE 10 MG/ML IJ SOLN
20.0000 mg | Freq: Two times a day (BID) | INTRAMUSCULAR | Status: AC
  Administered 2017-05-31 (×2): 20 mg via INTRAVENOUS
  Filled 2017-05-31 (×2): qty 2

## 2017-05-31 NOTE — Progress Notes (Signed)
Patient is off of bipap and on a venti-mask at this time.

## 2017-05-31 NOTE — Progress Notes (Addendum)
While changing IJ dsg, IJ Line out aprox 2 inches. IVF stopped, PCXR ordered for verification of line placement, nursing will cont to monitor   Update: PCXR done IVF restarted

## 2017-05-31 NOTE — Progress Notes (Signed)
PT Cancellation Note  Patient Details Name: HAMPTON COST MRN: [id number] DOB: 07-Feb-1935   Cancelled Treatment:    Reason Eval/Treat Not Completed: Medical issues which prohibited therapy.  Patient just off BiPAP and now on Venti-mask.  Will return at later date.   Despina Pole 05/31/2017, 2:11 PM Carita Pian. Sanjuana Kava, [place] Pager [phone]

## 2017-05-31 NOTE — Progress Notes (Addendum)
Brooke [facility] Note  ADMISSION DATE:  05/20/2017  CONSULTATION DATE: 05/24/2017  REFERRING MD:  Servando Snare, M.D. / Vascular Surgery   Presenting HPI:  82 y.o. female with history of COPD, chronic hypoxic respiratory failure, diabetes mellitus type 2, congestive heart failure, hypertension, and multiple myeloma. Patient underwent bypass surgery for non-healing ulcer of the ankle. Patient developed subsequent abdominal distention and ileus but refused NG tube placement. Patient became more altered and hypoxic overnight with nausea and vomiting. Patient had subsequent probable aspiration. Patient was noted to be in atrial fibrillation with rapid ventricular response and hypertensive. Metoprolol and Lasix were administered. Patient was transferred to the ICU and placed on noninvasive positive pressure ventilation. Patient's respiratory status remained marginal at best and ultimately she was endotracheally intubated.  Subjective:  Extubated 8/16. Still on bipap and is unable to tolerate breaks  Review of Systems:  No abdominal pain or nausea. No subjective fever or chills. Further review of systems is difficult to obtain with BiPAP mask in place.  FiO2 (%):  [30 %] 30 %  Temp:  [95.4 F (35.2 C)-97.9 F (36.6 C)] 97.9 F (36.6 C) (08/18 0440) Pulse Rate:  [47-124] 48 (08/18 0500) Resp:  [18-32] 22 (08/18 0500) BP: (84-163)/(61-122) 154/75 (08/18 0500) SpO2:  [96 %-100 %] 100 % (08/18 0500) FiO2 (%):  [30 %] 30 % (08/18 0149) Gen:      No acute distress, elderly female on bipap HEENT:  EOMI, sclera anicteric Neck:     No masses; no thyromegaly Lungs:    Clear to auscultation bilaterally; normal respiratory effort CV:         Regular rate and rhythm; no murmurs Abd:      + bowel sounds; soft, non-tender; no palpable masses, no distension Ext:    No edema; adequate peripheral perfusion Skin:      Warm and dry; no rash Neuro: alert and oriented x 3 Psych: normal mood and  affect  LINES/TUBES: OETT 8/11 - 8/15 NGT (out 8/16) L [place] CVL 8/11 >> IMPLANTED PORT >> Foley >> PIV  CBC Latest Ref Rng & Units 05/31/2017 05/30/2017 05/29/2017  WBC 4.0 - 10.5 K/uL 8.0 9.6 14.1(H)  Hemoglobin 12.0 - 15.0 g/dL 9.5(L) 9.6(L) 9.1(L)  Hematocrit 36.0 - 46.0 % 29.8(L) 30.7(L) 29.0(L)  Platelets 150 - 400 K/uL 121(L) 123(L) 115(L)   BMP Latest Ref Rng & Units 05/31/2017 05/30/2017 05/30/2017  Glucose 65 - 99 mg/dL 130(H) 133(H) 112(H)  BUN 6 - 20 mg/dL 41(H) 43(H) 44(H)  Creatinine 0.44 - 1.00 mg/dL 1.62(H) 1.78(H) 1.83(H)  Sodium 135 - 145 mmol/L 144 142 141  Potassium 3.5 - 5.1 mmol/L 3.4(L) 3.7 3.5  Chloride 101 - 111 mmol/L 110 107 107  CO2 22 - 32 mmol/L 21(L) 20(L) 20(L)  Calcium 8.9 - 10.3 mg/dL 7.4(L) 7.1(L) 7.0(L)   Hepatic Function Latest Ref Rng & Units 05/31/2017 05/30/2017 05/29/2017  Total Protein 6.5 - 8.1 g/dL - - -  Albumin 3.5 - 5.0 g/dL 2.2(L) 2.2(L) 2.2(L)  AST 15 - 41 U/L - - -  ALT 14 - 54 U/L - - -  Alk Phosphatase 38 - 126 U/L - - -  Total Bilirubin 0.3 - 1.2 mg/dL - - -    IMAGING/STUDIES: CT CHEST W/O 8/11: IMPRESSION: 1. Dense consolidation of the right lower lobe, and patchy airspace opacities within the remainder of the right lung and left lower lobe, compatible with multifocal pneumonia. 2. Trace right-sided pleural fluid noted. 3. Diffuse  coronary artery calcifications noted. 4. An incidental finding of potential clinical significance has been found. Aneurysmal dilatation of the infrarenal abdominal aorta to 3.3 cm in AP dimension. Recommend followup by ultrasound in 3 years. This recommendation follows ACR consensus guidelines: White Paper of the ACR Incidental Findings Committee II on Vascular Findings. J Am Coll Radiol 2013; 29:924-268 5. Diffuse aortic atherosclerosis. 6. 1.2 cm nonspecific hypodensity at the right hepatic lobe. 7. Chronic compression deformities at T4, T5, T6, T7, T10 and L1. PORT ABD X-RAY 8/12:  Decreased small  bowel dilatation since previous study. PORT CXR 8/12:  Right lower lobe opacity with chronic elevation of right hemidiaphragm. Somewhat lordotic and rotated film. Endotracheal tube in good position. Left central venous catheter in good position. Enteric feeding tube coursing below diaphragm. PORT CXR 8/14:  Previously reviewed by me. Persistent patchy bilateral opacities, right predominant, relatively unchanged when compared with x-ray imaging from 8/12. No new pleural effusion. Endotracheal tube and left central venous catheter in good position. Enteric feeding tube coursing below diaphragm. PORT CXR 8/15:  Previously reviewed by me. Central venous catheter in good position. Enteric feeding tube coursing below diaphragm. Some slight worsening in bilateral pleural effusions versus atelectasis.  MICROBIOLOGY: MRSA PCR 7/25:  Positive  Blood Cultures x2 8/12 >> Urine Culture 8/12:  Negative  Tracheal Aspirate Culture 8/12:  Moderate Klebsiella pneumoniae & Enterobacter cloacae   ANTIBIOTICS: Zyvox 8/11 - 8/14 Zosyn 8/11 - 8/15 Rocephin 8/15 (x1 dose) Cefepime 8/16 >>>  SIGNIFICANT EVENTS: 08/07 - Admit 08/11 - Transferred to ICU w/ respiratory failure & atrial fibrillation >> failed BiPAP & was intubated 08/12 - Tolerated 8 hours of PS 12/5 weaning  08/14 - Bowel movements restarted 08/16 - Hypoglycemic to 64 overnight. 1amp of Bicarb given for acidosis. Increased WOB>>BiPAP. Normal Sinus Rhythm.  ASSESSMENT/PLAN:   82 y.o.  female with aspiration pneumonia and acute hypoxic respiratory failure. Continuing intermittent noninvasive positive pressure ventilation for increased work of breathing. Remains tenuous. High risk for reintubation with intermittent delirium. Continuing to monitor closely in the intensive Keller unit.  PULMONARY A: Acute hypoxic respiratory failure: Secondary to aspiration. Improving. History of COPD  P:   Continue BiPAP with short breaks Incentive spirometry  or Wean FiO2 Continue to every 6 hours Follow-up chest x-ray Try gentle diuresis today.  CARDIOVASCULAR A:  Shock: Likely secondary to sepsis. Resolved. Peripheral vascular disease: Status post bypass. Atrial fibrillation:  New diagnosis. Converted to normal sinus rhythm.  P:  Goal MAP >65 & SBP >90 Continue amiodarone PO, aspirin Postoperative Keller per vascular surgery.  RENAL A:   Acute on chronic renal failure: Stable. Hypokalemia: Resolved. Hyperphosphatemia: Mild. Metabolic acidosis: Mild. Status post 1 amp of sodium bicarbonate 8/16.  P:   Replete K Try lasix 20 gm q12 for 2 doses Follow urine output and Cr  GASTROINTESTINAL A:   Ileus: Resolved.  P:   NPO with Ice chips  HEMATOLOGIC/ONCOLOGIC A:   Leukocytosis:  Resolved. Pancytopenia: Resolving. Likely multifactorial from sepsis and the Zyvox. H/O IgG Multiple Myeloma  P:  Trending cell counts daily with CBC Holding treatment for multiple myeloma Transfusing for Hgb <7.0 or active bleeding  INFECTIOUS A:   Sepsis Klebsiella & Enterobacter Pneumonia:  Secondary to aspiration.  P:   Continuing cefepime given inducible resistance with Enterobacter  ENDOCRINE A:   Diabetes Mellitus: Hypoglycemic overnight. Chronic Prednisone    P:   SSI coverage Continue hydrocortisone Holding home Prednisone & Metformin  NEUROLOGIC A:   Delirium Post-op  Pain Chronic Benzodiazepine Use  P:   Tylenol prn Pain  Fentanyl IV prn Severe Pain Avoiding sedating medications  Prophylaxis:  Protonix IV daily, SCDs, & heparin subcutaneous every 8 hours. Diet:  Sips & [employer] today. Code Status:  Full Code per previous physician discussions. Disposition:  Patient to remain in the ICU given tenuous respiratory status and high potential for reintubation. Family Update:  Husband updated during my exam today.  The patient is critically ill with multiple organ system failure and requires high complexity decision  making for assessment and support, frequent evaluation and titration of therapies, advanced monitoring, review of radiographic studies and interpretation of complex data.   Critical Keller Time devoted to patient Keller services, exclusive of separately billable procedures, described in this note is 35 minutes.   Marshell Garfinkel MD [facility] Pager [phone] If no answer or after 3pm call: [phone] 05/31/2017, 7:27 AM

## 2017-05-31 NOTE — Plan of Care (Signed)
Problem: Skin Integrity: Goal: Risk for impaired skin integrity will decrease Outcome: Progressing Bilateral heel wound debridement progressing from black to yellow  Problem: Coping: Goal: Level of anxiety will decrease Outcome: Progressing Compliant with treatments and procedures despite mild confusion  Problem: Nutritional: Goal: Intake of prescribed amount of daily calories will improve Outcome: Not Progressing NPO, no tube feeds  Problem: Respiratory: Goal: Ability to maintain a clear airway and adequate ventilation will improve Outcome: Progressing Progressed from bipap to venturi mask for periods during day

## 2017-05-31 NOTE — Progress Notes (Signed)
Subjective  -   Resting on Bipap   Physical Exam:  Incisions intact PT signals       Assessment/Plan:   Vasc:  PT signals  Pulm:  Klebsiella and Enterobacter on aspirate. On Cefepime PT/OT:  Unable to participate with therapy due to Bipap Wounds:  Dressings intact, changed daily with Santyl GI:  Tolerating NG out.  May start clears Brooke Keller 05/31/2017 8:39 AM --  Vitals:   05/31/17 0740 05/31/17 0809  BP: (!) 156/80   Pulse: (!) 58   Resp: (!) 25   Temp:  (!) 96.1 F (35.6 C)  SpO2: 98%     Intake/Output Summary (Last 24 hours) at 05/31/17 0839 Last data filed at 05/31/17 0700  Gross per 24 hour  Intake              630 ml  Output             1250 ml  Net             -620 ml     Laboratory CBC    Component Value Date/Time   WBC 8.0 05/31/2017 0319   HGB 9.5 (L) 05/31/2017 0319   HCT 29.8 (L) 05/31/2017 0319   PLT 121 (L) 05/31/2017 0319    BMET    Component Value Date/Time   NA 144 05/31/2017 0319   K 3.4 (L) 05/31/2017 0319   CL 110 05/31/2017 0319   CO2 21 (L) 05/31/2017 0319   GLUCOSE 130 (H) 05/31/2017 0319   BUN 41 (H) 05/31/2017 0319   CREATININE 1.62 (H) 05/31/2017 0319   CALCIUM 7.4 (L) 05/31/2017 0319   GFRNONAA 29 (L) 05/31/2017 0319   GFRAA 33 (L) 05/31/2017 0319    COAG Lab Results  Component Value Date   INR 1.06 05/07/2017   INR 0.94 10/15/2012   INR 1.01 10/01/2011   No results found for: PTT  Antibiotics Anti-infectives    Start     Dose/Rate Route Frequency Ordered Stop   05/29/17 1000  ceFEPIme (MAXIPIME) 1 g in dextrose 5 % 50 mL IVPB     1 g 100 mL/hr over 30 Minutes Intravenous Every 24 hours 05/29/17 0832     05/28/17 1200  cefTRIAXone (ROCEPHIN) 2 g in dextrose 5 % 50 mL IVPB  Status:  Discontinued     2 g 100 mL/hr over 30 Minutes Intravenous Every 24 hours 05/28/17 0737 05/29/17 0832   05/26/17 1200  piperacillin-tazobactam (ZOSYN) IVPB 3.375 g  Status:  Discontinued     3.375 g 12.5 mL/hr  over 240 Minutes Intravenous Every 8 hours 05/26/17 1042 05/28/17 0737   05/25/17 1000  piperacillin-tazobactam (ZOSYN) IVPB 2.25 g  Status:  Discontinued     2.25 g 100 mL/hr over 30 Minutes Intravenous Every 8 hours 05/25/17 0745 05/26/17 1042   05/24/17 1800  linezolid (ZYVOX) IVPB 600 mg  Status:  Discontinued     600 mg 300 mL/hr over 60 Minutes Intravenous Every 12 hours 05/24/17 1709 05/27/17 0817   05/24/17 1800  piperacillin-tazobactam (ZOSYN) IVPB 3.375 g  Status:  Discontinued     3.375 g 12.5 mL/hr over 240 Minutes Intravenous Every 8 hours 05/24/17 1713 05/25/17 0745   05/24/17 1715  piperacillin-tazobactam (ZOSYN) IVPB 3.375 g  Status:  Discontinued     3.375 g 100 mL/hr over 30 Minutes Intravenous Every 8 hours 05/24/17 1709 05/24/17 1712   05/20/17 2200  cefUROXime (ZINACEF) 1.5 g in dextrose 5 % 50  mL IVPB     1.5 g 100 mL/hr over 30 Minutes Intravenous Every 12 hours 05/20/17 1756 05/21/17 1034   05/20/17 0850  dextrose 5 % with cefUROXime (ZINACEF) ADS Med    Comments:  Ardine Eng   : cabinet override      05/20/17 0850 05/20/17 1033   05/20/17 0837  cefUROXime (ZINACEF) 1.5 g in dextrose 5 % 50 mL IVPB     1.5 g 100 mL/hr over 30 Minutes Intravenous 30 min pre-op 05/20/17 0837 05/20/17 1043       V. Leia Alf, M.D. [facility] Office: [phone] Pager:  [phone]

## 2017-05-31 NOTE — Progress Notes (Signed)
[facility] [place] Progress Note Patient Name: [name] DOB: 06-15-1935 MRN: [id number]   Date of Service  05/31/2017  HPI/Events of Note    eICU Interventions  KCl given     Intervention Category Intermediate Interventions: Electrolyte abnormality - evaluation and management  Justis Dupas S. 05/31/2017, 4:07 AM

## 2017-05-31 NOTE — Progress Notes (Signed)
[facility] Progress Note Patient Name: Brooke Keller DOB: 12/21/1934 MRN: [id number]   Date of Service  05/31/2017  HPI/Events of Note  Low potassium  eICU Interventions  replaced     Intervention Category Minor Interventions: Electrolytes abnormality - evaluation and management  Mauri [place], P 05/31/2017, 6:54 PM

## 2017-06-01 ENCOUNTER — Inpatient Hospital Stay (HOSPITAL_COMMUNITY): Payer: Medicare Other

## 2017-06-01 LAB — CBC WITH DIFFERENTIAL/PLATELET
Basophils Absolute: 0 10*3/uL (ref 0.0–0.1)
Basophils Relative: 0 %
EOS PCT: 0 %
Eosinophils Absolute: 0 10*3/uL (ref 0.0–0.7)
HEMATOCRIT: 29.1 % — AB (ref 36.0–46.0)
Hemoglobin: 9.3 g/dL — ABNORMAL LOW (ref 12.0–15.0)
LYMPHS ABS: 0.7 10*3/uL (ref 0.7–4.0)
LYMPHS PCT: 10 %
MCH: 29.2 pg (ref 26.0–34.0)
MCHC: 32 g/dL (ref 30.0–36.0)
MCV: 91.2 fL (ref 78.0–100.0)
MONO ABS: 0.6 10*3/uL (ref 0.1–1.0)
MONOS PCT: 9 %
NEUTROS ABS: 5.3 10*3/uL (ref 1.7–7.7)
Neutrophils Relative %: 81 %
PLATELETS: 138 10*3/uL — AB (ref 150–400)
RBC: 3.19 MIL/uL — ABNORMAL LOW (ref 3.87–5.11)
RDW: 17.9 % — AB (ref 11.5–15.5)
WBC: 6.6 10*3/uL (ref 4.0–10.5)

## 2017-06-01 LAB — RENAL FUNCTION PANEL
Albumin: 2.5 g/dL — ABNORMAL LOW (ref 3.5–5.0)
Anion gap: 13 (ref 5–15)
BUN: 34 mg/dL — AB (ref 6–20)
CHLORIDE: 109 mmol/L (ref 101–111)
CO2: 25 mmol/L (ref 22–32)
CREATININE: 1.53 mg/dL — AB (ref 0.44–1.00)
Calcium: 7.7 mg/dL — ABNORMAL LOW (ref 8.9–10.3)
GFR calc Af Amer: 36 mL/min — ABNORMAL LOW (ref >60)
GFR, EST NON AFRICAN AMERICAN: 31 mL/min — AB (ref >60)
GLUCOSE: 137 mg/dL — AB (ref 65–99)
POTASSIUM: 3.4 mmol/L — AB (ref 3.5–5.1)
Phosphorus: 2.8 mg/dL (ref 2.5–4.6)
Sodium: 147 mmol/L — ABNORMAL HIGH (ref 135–145)

## 2017-06-01 LAB — PHOSPHORUS: PHOSPHORUS: 2.9 mg/dL (ref 2.5–4.6)

## 2017-06-01 LAB — BASIC METABOLIC PANEL
ANION GAP: 13 (ref 5–15)
BUN: 35 mg/dL — AB (ref 6–20)
CO2: 24 mmol/L (ref 22–32)
Calcium: 7.7 mg/dL — ABNORMAL LOW (ref 8.9–10.3)
Chloride: 110 mmol/L (ref 101–111)
Creatinine, Ser: 1.51 mg/dL — ABNORMAL HIGH (ref 0.44–1.00)
GFR calc Af Amer: 36 mL/min — ABNORMAL LOW (ref >60)
GFR, EST NON AFRICAN AMERICAN: 31 mL/min — AB (ref >60)
GLUCOSE: 132 mg/dL — AB (ref 65–99)
POTASSIUM: 3.4 mmol/L — AB (ref 3.5–5.1)
Sodium: 147 mmol/L — ABNORMAL HIGH (ref 135–145)

## 2017-06-01 LAB — GLUCOSE, CAPILLARY
GLUCOSE-CAPILLARY: 149 mg/dL — AB (ref 65–99)
GLUCOSE-CAPILLARY: 109 mg/dL — AB (ref 65–99)
Glucose-Capillary: 117 mg/dL — ABNORMAL HIGH (ref 65–99)
Glucose-Capillary: 133 mg/dL — ABNORMAL HIGH (ref 65–99)

## 2017-06-01 LAB — MAGNESIUM: Magnesium: 1.9 mg/dL (ref 1.7–2.4)

## 2017-06-01 MED ORDER — CHLORHEXIDINE GLUCONATE 0.12 % MT SOLN
15.0000 mL | Freq: Two times a day (BID) | OROMUCOSAL | Status: DC
  Administered 2017-06-02 – 2017-06-09 (×14): 15 mL via OROMUCOSAL
  Filled 2017-06-01 (×11): qty 15

## 2017-06-01 MED ORDER — POTASSIUM CHLORIDE 10 MEQ/50ML IV SOLN
10.0000 meq | INTRAVENOUS | Status: AC
  Administered 2017-06-01 (×4): 10 meq via INTRAVENOUS
  Filled 2017-06-01 (×4): qty 50

## 2017-06-01 MED ORDER — ORAL CARE MOUTH RINSE
15.0000 mL | Freq: Two times a day (BID) | OROMUCOSAL | Status: DC
  Administered 2017-06-02 – 2017-06-08 (×9): 15 mL via OROMUCOSAL

## 2017-06-01 NOTE — Progress Notes (Signed)
Subjective  - POD #12  Resting comfortably   Physical Exam:  Incisions ok ?graft pulse PT signals       Assessment/Plan:  POD #12  Pulm:  Intubation likely secondary to aspiration, now extubated on Bipap.  CXR with pleural effusions and pulmponary edema.  Possible need for repeat chest CT.  Weaning BiPap Cardiac:  Post op AFIB, converted to sinus.  On Amnio GI:  NPO with ice chips.  May need SLP eval once off bipap Wounds:  Santyl SQ heparin foro DVT prophylaxis Protonix for GI prophylaxis PT/OT when medically cleared   [place], [place] 06/01/2017 11:02 AM --  Vitals:   06/01/17 0900 06/01/17 1000  BP: (!) 162/65 (!) 89/56  Pulse: 62 67  Resp: (!) 21 (!) 22  Temp:    SpO2: 100% 100%    Intake/Output Summary (Last 24 hours) at 06/01/17 1102 Last data filed at 06/01/17 1002  Gross per 24 hour  Intake              600 ml  Output             4225 ml  Net            -3625 ml     Laboratory CBC    Component Value Date/Time   WBC 6.6 06/01/2017 0300   HGB 9.3 (L) 06/01/2017 0300   HCT 29.1 (L) 06/01/2017 0300   PLT 138 (L) 06/01/2017 0300    BMET    Component Value Date/Time   NA 147 (H) 06/01/2017 0300   NA 147 (H) 06/01/2017 0300   K 3.4 (L) 06/01/2017 0300   K 3.4 (L) 06/01/2017 0300   CL 109 06/01/2017 0300   CL 110 06/01/2017 0300   CO2 25 06/01/2017 0300   CO2 24 06/01/2017 0300   GLUCOSE 137 (H) 06/01/2017 0300   GLUCOSE 132 (H) 06/01/2017 0300   BUN 34 (H) 06/01/2017 0300   BUN 35 (H) 06/01/2017 0300   CREATININE 1.53 (H) 06/01/2017 0300   CREATININE 1.51 (H) 06/01/2017 0300   CALCIUM 7.7 (L) 06/01/2017 0300   CALCIUM 7.7 (L) 06/01/2017 0300   GFRNONAA 31 (L) 06/01/2017 0300   GFRNONAA 31 (L) 06/01/2017 0300   GFRAA 36 (L) 06/01/2017 0300   GFRAA 36 (L) 06/01/2017 0300    COAG Lab Results  Component Value Date   INR 1.06 05/07/2017   INR 0.94 10/15/2012   INR 1.01 10/01/2011   No results found for:  PTT  Antibiotics Anti-infectives    Start     Dose/Rate Route Frequency Ordered Stop   05/29/17 1000  ceFEPIme (MAXIPIME) 1 g in dextrose 5 % 50 mL IVPB     1 g 100 mL/hr over 30 Minutes Intravenous Every 24 hours 05/29/17 0832     05/28/17 1200  cefTRIAXone (ROCEPHIN) 2 g in dextrose 5 % 50 mL IVPB  Status:  Discontinued     2 g 100 mL/hr over 30 Minutes Intravenous Every 24 hours 05/28/17 0737 05/29/17 0832   05/26/17 1200  piperacillin-tazobactam (ZOSYN) IVPB 3.375 g  Status:  Discontinued     3.375 g 12.5 mL/hr over 240 Minutes Intravenous Every 8 hours 05/26/17 1042 05/28/17 0737   05/25/17 1000  piperacillin-tazobactam (ZOSYN) IVPB 2.25 g  Status:  Discontinued     2.25 g 100 mL/hr over 30 Minutes Intravenous Every 8 hours 05/25/17 0745 05/26/17 1042   05/24/17 1800  linezolid (ZYVOX) IVPB 600 mg  Status:  Discontinued  600 mg 300 mL/hr over 60 Minutes Intravenous Every 12 hours 05/24/17 1709 05/27/17 0817   05/24/17 1800  piperacillin-tazobactam (ZOSYN) IVPB 3.375 g  Status:  Discontinued     3.375 g 12.5 mL/hr over 240 Minutes Intravenous Every 8 hours 05/24/17 1713 05/25/17 0745   05/24/17 1715  piperacillin-tazobactam (ZOSYN) IVPB 3.375 g  Status:  Discontinued     3.375 g 100 mL/hr over 30 Minutes Intravenous Every 8 hours 05/24/17 1709 05/24/17 1712   05/20/17 2200  cefUROXime (ZINACEF) 1.5 g in dextrose 5 % 50 mL IVPB     1.5 g 100 mL/hr over 30 Minutes Intravenous Every 12 hours 05/20/17 1756 05/21/17 1034   05/20/17 0850  dextrose 5 % with cefUROXime (ZINACEF) ADS Med    Comments:  Ardine Eng   : cabinet override      05/20/17 0850 05/20/17 1033   05/20/17 0837  cefUROXime (ZINACEF) 1.5 g in dextrose 5 % 50 mL IVPB     1.5 g 100 mL/hr over 30 Minutes Intravenous 30 min pre-op 05/20/17 0837 05/20/17 1043       V. Leia Alf, M.D. [facility] Office: [phone] Pager:  [phone]

## 2017-06-01 NOTE — Evaluation (Signed)
Clinical/Bedside Swallow Evaluation Patient Details  Name: Brooke Keller MRN: [id number] Date of Birth: 1935-06-21  Today's Date: 06/01/2017 Time: SLP Start Time (ACUTE ONLY): 1100 SLP Stop Time (ACUTE ONLY): 1130 SLP Time Calculation (min) (ACUTE ONLY): 30 min  Past Medical History:  Past Medical History:  Diagnosis Date  . Anemia   . CHF (congestive heart failure) ([place])   . COPD (chronic obstructive pulmonary disease) ([place])   . Diabetes mellitus    type 2  . GERD (gastroesophageal reflux disease)   . Hypertension   . IgG multiple myeloma ([place]) 04/14/2016  . Multiple myeloma   . Peripheral vascular disease ([place])   . Supplemental oxygen dependent    2L o2 she should be coming off oxygen soon   Past Surgical History:  Past Surgical History:  Procedure Laterality Date  . ABDOMINAL AORTOGRAM W/LOWER EXTREMITY N/A 03/24/2017   Procedure: Abdominal Aortogram w/Lower Extremity;  Surgeon: Angelia Mould, MD;  Location: [facility];  Service: Cardiovascular;  Laterality: N/A;  . AXILLARY-FEMORAL BYPASS GRAFT Right 05/20/2017   Procedure: Right  AXILLA-BIFEMORAL Bypass Graft Application Of bilateral femoral provenas;  Surgeon: Angelia Mould, MD;  Location: [facility];  Service: Vascular;  Laterality: Right;  . carpal tunne    . KYPHOPLASTY    . PORTACATH PLACEMENT    . right fifth toe     took a bone out   HPI:  82 year old female admitted 05/20/17 for bypass surgery on ankle ulcer. PMH significant for COPD, chronic hypoxic respiratory failure, DM2, CHF, HTN, multiple myeloma. CXR = bilateral pleural effusions with underlying atelectasis, worsened on the left, pulmonary edema.   Assessment / Plan / Recommendation Clinical Impression  Oral care completed with suction. Pt is edentulous. Daughter reports pt has dentures at home, but does not consistently wear them to eat. She was tolerating a regular diet and thin liquids prior to admit. Oral cavity was moist with minimal  secretions easily removed with suction. Pt was given ice chips and puree. Orally, no difficulty was noted. Pharyngeally, pt exhibited reduced laryngeal elevation per palpation, and delayed swallow reflex is suspected. Delayed, weak cough response was exhibited after both ice chips and puree, raising concern for airway compromise.   Pt is significantly weak and lethargic at this time, with volitional cough congested and nonproductive. Further, pt was intubated for 5 days which increases risk of dysphagia post extubation, and has a diagnosis of COPD which increases risk for silent aspiration. Given this information in addition to bedside presentation, STRICT NPO status continues to be recommended, including medications. Consideration of nonoral feeding method is also recommended, given length of time without nutritional support. Family and RN informed of results and recommendations. ST will continue to follow pt acutely to reassess readiness for po intake/medication/objective study.   SLP Visit Diagnosis: Dysphagia, unspecified (R13.10)    Aspiration Risk  Severe aspiration risk;Risk for inadequate nutrition/hydration    Diet Recommendation NPO   Medication Administration: Via alternative means    Other  Recommendations Oral Care Recommendations: Oral care QID;Staff/trained caregiver to provide oral care Other Recommendations: Have oral suction available   Follow up Recommendations  (TBD)      Frequency and Duration min 2x/week  2 weeks       Prognosis Prognosis for Safe Diet Advancement: Fair Barriers to Reach Goals: Severity of deficits      Swallow Study   General Date of Onset: 05/20/17 HPI: 82 year old female admitted 05/20/17 for bypass  surgery on ankle ulcer. PMH significant for COPD, chronic hypoxic respiratory failure, DM2, CHF, HTN, multiple myeloma. CXR = bilateral pleural effusions with underlying atelectasis, worsened on the left, pulmonary edema. Type of Study: Bedside  Swallow Evaluation Previous Swallow Assessment: none Diet Prior to this Study: NPO Temperature Spikes Noted: No Respiratory Status: Venti-mask History of Recent Intubation: Yes Length of Intubations (days): 5 days Date extubated: 05/28/17 Behavior/Cognition: Cooperative;Lethargic/Drowsy;Requires cueing Oral Cavity Assessment: Within Functional Limits Oral Care Completed by SLP: Yes Oral Cavity - Dentition: Edentulous (dentures not here) Self-Feeding Abilities: Total assist Patient Positioning: Upright in bed Baseline Vocal Quality: Normal;Low vocal intensity Volitional Cough: Weak;Congested Volitional Swallow: Unable to elicit    Oral/Motor/Sensory Function Overall Oral Motor/Sensory Function: Mild impairment (generalized weakness)   Ice Chips Ice chips: Impaired Presentation: Spoon Pharyngeal Phase Impairments: Suspected delayed Swallow;Decreased hyoid-laryngeal movement;Throat Clearing - Delayed;Cough - Delayed   Thin Liquid Thin Liquid: Not tested    Nectar Thick Nectar Thick Liquid: Not tested   Honey Thick Honey Thick Liquid: Not tested   Puree Puree: Impaired Presentation: Spoon Pharyngeal Phase Impairments: Suspected delayed Swallow;Cough - Immediate;Decreased hyoid-laryngeal movement   Solid   GO   Solid: Not tested       Celia B. Cyrus, [place], [place]  Shonna Chock 06/01/2017,11:45 AM

## 2017-06-01 NOTE — Progress Notes (Signed)
Patient is on a 8L venti mask and off bipap at this time.

## 2017-06-01 NOTE — Progress Notes (Signed)
PT Cancellation Note  Patient Details Name: Brooke Keller MRN: [id number] DOB: 05/08/1935   Cancelled Treatment:    Reason Eval/Treat Not Completed: Medical issues which prohibited therapy   Glyn Gerads A Maureen Duesing 06/01/2017, 12:15 PM Wray Kearns, [place], DPT [phone]

## 2017-06-01 NOTE — Progress Notes (Signed)
[facility] Progress Note Patient Name: Brooke Keller DOB: 1935/06/11 MRN: [id number]   Date of Service  06/01/2017  HPI/Events of Note    eICU Interventions  KCL given     Intervention Category Minor Interventions: Electrolytes abnormality - evaluation and management  Silas Sedam S. 06/01/2017, 4:19 AM

## 2017-06-01 NOTE — Progress Notes (Signed)
Pharmacy Antibiotic Note  Brooke Keller is a 82 y.o. female admitted on 05/20/2017 with pneumonia. Pt on cefepime for enterobacter/klebsiella PNA.  Scr improving a bit.    Plan: -Cefepime 1g IV q24h -F/u renal function and clinical course. -F/u LOT antibiotics  Height: 4\' 7"  (139.7 cm) Weight: 145 lb 11.6 oz (66.1 kg) IBW/kg (Calculated) : 34  Temp (24hrs), Avg:97 F (36.1 C), Min:96.3 F (35.7 C), Max:97.7 F (36.5 C)   Recent Labs Lab 05/27/17 0340  05/28/17 0349 05/29/17 0430 05/29/17 1113 05/29/17 1844 05/29/17 2046 05/30/17 0332 05/30/17 1754 05/31/17 0319 05/31/17 1802 06/01/17 0300  WBC 2.5*  --  6.3 14.1*  --   --   --  9.6  --  8.0  --  6.6  CREATININE 1.50*  < > 1.42* 1.78*  --   --   --  1.83* 1.78* 1.62* 1.52* 1.53*  1.51*  LATICACIDVEN 2.4*  --  1.9  --  1.0 1.1 1.2  --   --   --   --   --   < > = values in this interval not displayed.  Estimated Creatinine Clearance: 21.7 mL/min (A) (by C-G formula based on SCr of 1.53 mg/dL (H)).    Allergies  Allergen Reactions  . Ativan [Lorazepam] Other (See Comments)    hallucinations  . Amitriptyline Other (See Comments)    HALLUCINATIONS    Antimicrobials this admission: 8/11 Linezolid>>8/14 8/11 Zosyn>>8/15 8/15 CTX >>8/16 8/16 Cefepime >>  Dose adjustments this admission: n/a  Microbiology results: 8/12 TA: moderate Kleb pneumoniae, Enterobacter 8/12 BCx: Neg   Thank you for allowing pharmacy to be a part of this patient's care.  Uvaldo Rising, BCPS  Clinical Pharmacist Pager [phone]  06/01/2017 1:56 PM

## 2017-06-01 NOTE — Plan of Care (Signed)
Problem: Coping: Goal: Level of anxiety will decrease Outcome: Progressing Patient is able to sleep  Problem: Nutritional: Goal: Intake of prescribed amount of daily calories will improve Outcome: Not Progressing NPO; no nutritional intake> 3days  Problem: Respiratory: Goal: Ability to maintain a clear airway and adequate ventilation will improve Outcome: Progressing Venturi mask today > 7h no desat; returned to bipap due to work of breathing

## 2017-06-01 NOTE — Progress Notes (Signed)
[facility] Note  ADMISSION DATE:  05/20/2017  CONSULTATION DATE: 05/24/2017  REFERRING MD:  Servando Snare, M.D. / Vascular Surgery   Presenting HPI:  82 y.o. female with history of COPD, chronic hypoxic respiratory failure, diabetes mellitus type 2, congestive heart failure, hypertension, and multiple myeloma. Patient underwent bypass surgery for non-healing ulcer of the ankle. Patient developed subsequent abdominal distention and ileus but refused NG tube placement. Patient became more altered and hypoxic overnight with nausea and vomiting. Patient had subsequent probable aspiration. Patient was noted to be in atrial fibrillation with rapid ventricular response and hypertensive. Metoprolol and Lasix were administered. Patient was transferred to the ICU and placed on noninvasive positive pressure ventilation. Patient's respiratory status remained marginal at best and ultimately she was endotracheally intubated.  Subjective:   Continues on BiPAP  Review of Systems:  No abdominal pain or nausea. No subjective fever or chills. Further review of systems is difficult to obtain with BiPAP mask in place.  FiO2 (%):  [30 %-45 %] 30 %  Temp:  [96.1 F (35.6 C)-98.1 F (36.7 C)] 97.7 F (36.5 C) (08/19 0352) Pulse Rate:  [45-136] 60 (08/19 0700) Resp:  [12-30] 21 (08/19 0700) BP: (113-178)/(29-164) 161/41 (08/19 0700) SpO2:  [88 %-100 %] 100 % (08/19 0727) FiO2 (%):  [30 %-45 %] 30 % (08/19 0128)  Gen:      No acute distress, frail elderly on bipap HEENT:  EOMI, sclera anicteric Neck:     No masses; no thyromegaly Lungs:    Clear to auscultation bilaterally; normal respiratory effort CV:         Regular rate and rhythm; no murmurs Abd:      + bowel sounds; soft, non-tender; no palpable masses, no distension Ext:    1-2 + edema; adequate peripheral perfusion Skin:      Warm and dry; no rash Neuro: alert and oriented x 3 Psych: normal mood and affect  LINES/TUBES: OETT  8/11 - 8/15 NGT (out 8/16) L [place] CVL 8/11 >> IMPLANTED PORT >> F[place] >> PIV  CBC Latest Ref Rng & Units 06/01/2017 05/31/2017 05/30/2017  WBC 4.0 - 10.5 K/uL 6.6 8.0 9.6  Hemoglobin 12.0 - 15.0 g/dL 9.3(L) 9.5(L) 9.6(L)  Hematocrit 36.0 - 46.0 % 29.1(L) 29.8(L) 30.7(L)  Platelets 150 - 400 K/uL 138(L) 121(L) 123(L)   BMP Latest Ref Rng & Units 06/01/2017 06/01/2017 05/31/2017  Glucose 65 - 99 mg/dL 137(H) 132(H) 122(H)  BUN 6 - 20 mg/dL 34(H) 35(H) 38(H)  Creatinine 0.44 - 1.00 mg/dL 1.53(H) 1.51(H) 1.52(H)  Sodium 135 - 145 mmol/L 147(H) 147(H) 144  Potassium 3.5 - 5.1 mmol/L 3.4(L) 3.4(L) 3.3(L)  Chloride 101 - 111 mmol/L 109 110 107  CO2 22 - 32 mmol/L _0 Calcium 8.9 - 10.3 mg/dL 7.7(L) 7.7(L) 7.5(L)   Hepatic Function Latest Ref Rng & Units 06/01/2017 05/31/2017 05/30/2017  Total Protein 6.5 - 8.1 g/dL - - -  Albumin 3.5 - 5.0 g/dL 2.5(L) 2.2(L) 2.2(L)  AST 15 - 41 U/L - - -  ALT 14 - 54 U/L - - -  Alk Phosphatase 38 - 126 U/L - - -  Total Bilirubin 0.3 - 1.2 mg/dL - - -    IMAGING/STUDIES: CT CHEST W/O 8/11: IMPRESSION: 1. Dense consolidation of the right lower lobe, and patchy airspace opacities within the remainder of the right lung and left lower lobe, compatible with multifocal pneumonia. 2. Trace right-sided pleural fluid noted. 3. Diffuse coronary artery calcifications  noted. 4. An incidental finding of potential clinical significance has been found. Aneurysmal dilatation of the infrarenal abdominal aorta to 3.3 cm in AP dimension. Recommend followup by ultrasound in 3 years. This recommendation follows ACR consensus guidelines: White Paper of the ACR Incidental Findings Committee II on Vascular Findings. J Am Coll Radiol 2013; 49:179-150 5. Diffuse aortic atherosclerosis. 6. 1.2 cm nonspecific hypodensity at the right hepatic lobe. 7. Chronic compression deformities at T4, T5, T6, T7, T10 and L1. PORT ABD X-RAY 8/12:  Decreased small bowel dilatation since previous  study. PORT CXR 8/12:  Right lower lobe opacity with chronic elevation of right hemidiaphragm. Somewhat lordotic and rotated film. Endotracheal tube in good position. Left central venous catheter in good position. Enteric feeding tube coursing below diaphragm. PORT CXR 8/14:  Previously reviewed by me. Persistent patchy bilateral opacities, right predominant, relatively unchanged when compared with x-ray imaging from 8/12. No new pleural effusion. Endotracheal tube and left central venous catheter in good position. Enteric feeding tube coursing below diaphragm. PORT CXR 8/15:  Previously reviewed by me. Central venous catheter in good position. Enteric feeding tube coursing below diaphragm. Some slight worsening in bilateral pleural effusions versus atelectasis.  MICROBIOLOGY: MRSA PCR 7/25:  Positive  Blood Cultures x2 8/12 >> Urine Culture 8/12:  Negative  Tracheal Aspirate Culture 8/12:  Moderate Klebsiella pneumoniae & Enterobacter cloacae   ANTIBIOTICS: Zyvox 8/11 - 8/14 Zosyn 8/11 - 8/15 Rocephin 8/15 (x1 dose) Cefepime 8/16 >>>  SIGNIFICANT EVENTS: 08/07 - Admit 08/11 - Transferred to ICU w/ respiratory failure & atrial fibrillation >> failed BiPAP & was intubated 08/12 - Tolerated 8 hours of PS 12/5 weaning  08/14 - Bowel movements restarted 08/16 - Hypoglycemic to 64 overnight. 1amp of Bicarb given for acidosis. Increased WOB>>BiPAP. Normal Sinus Rhythm.  ASSESSMENT/PLAN:   82 y.o.  female with aspiration pneumonia and acute hypoxic respiratory failure. Continuing intermittent noninvasive positive pressure ventilation for increased work of breathing. Remains tenuous. High risk for reintubation with intermittent delirium. Continuing to monitor closely in the intensive care unit.  PULMONARY A: Acute hypoxic respiratory failure: Secondary to aspiration.  History of COPD  P:   Continue BiPAP with short breaks Incentive spirometry Wean FiO2 Continue duonebs 6 hours Follow up  repeat CXR tomorrow. May need CT chest if not improving to reassess effusions and consolidations  CARDIOVASCULAR A:  Shock: Likely secondary to sepsis. Resolved. Peripheral vascular disease: Status post bypass. Atrial fibrillation:  New diagnosis. Converted to normal sinus rhythm.  P:  Goal MAP >65 & SBP >90 Continue amiodarone PO, aspirin Postoperative care per vascular surgery.  RENAL A:   Acute on chronic renal failure: Stable. Hypokalemia: Resolved. Hyperphosphatemia: Mild. Metabolic acidosis: Mild. Status post 1 amp of sodium bicarbonate 8/16.  P:   Replete K Follow urine output and Cr Continue diuresis  GASTROINTESTINAL A:   Ileus: Resolved.  P:   NPO with Ice chips  HEMATOLOGIC/ONCOLOGIC A:   Leukocytosis:  Resolved. Pancytopenia: Resolving. Likely multifactorial from sepsis and the Zyvox. H/O IgG Multiple Myeloma  P:  Trending cell counts daily with CBC Treatment for multiple myeloma is on hold  INFECTIOUS A:   Sepsis Klebsiella & Enterobacter Pneumonia:  Secondary to aspiration.  P:   Ceftriaxone changed to cefepime 8/16 given inducible resistance with Enterobacter  ENDOCRINE A:   Diabetes Mellitus: Hypoglycemic overnight. Chronic Prednisone    P:   SSI coverage Continue hydrocortisone. Holding home Prednisone & Metformin  NEUROLOGIC A:   Delirium Post-op Pain Chronic Benzodiazepine  Use  P:   Tylenol prn Pain  Fentanyl IV prn Severe Pain Avoiding sedating medications  Prophylaxis:  Protonix IV daily, SCDs, & hep SQ. Diet:  Ice chips during breaks from Bipap Code Status:  Full code Disposition:  ICU Family Update:  Daughter updated at bedside 8/19  The patient is critically ill with multiple organ system failure and requires high complexity decision making for assessment and support, frequent evaluation and titration of therapies, advanced monitoring, review of radiographic studies and interpretation of complex data.   Critical  Care Time devoted to patient care services, exclusive of separately billable procedures, described in this note is 35 minutes.   Marshell Garfinkel MD [facility] Pager [phone] If no answer or after 3pm call: [phone] 06/01/2017, 7:33 AM

## 2017-06-02 ENCOUNTER — Inpatient Hospital Stay (HOSPITAL_COMMUNITY): Payer: Medicare Other

## 2017-06-02 LAB — GLUCOSE, CAPILLARY
GLUCOSE-CAPILLARY: 116 mg/dL — AB (ref 65–99)
GLUCOSE-CAPILLARY: 93 mg/dL (ref 65–99)
GLUCOSE-CAPILLARY: 98 mg/dL (ref 65–99)
GLUCOSE-CAPILLARY: 135 mg/dL — AB (ref 65–99)
Glucose-Capillary: 141 mg/dL — ABNORMAL HIGH (ref 65–99)
Glucose-Capillary: 146 mg/dL — ABNORMAL HIGH (ref 65–99)
Glucose-Capillary: 153 mg/dL — ABNORMAL HIGH (ref 65–99)
Glucose-Capillary: 159 mg/dL — ABNORMAL HIGH (ref 65–99)

## 2017-06-02 LAB — ALBUMIN: Albumin: 2.4 g/dL — ABNORMAL LOW (ref 3.5–5.0)

## 2017-06-02 LAB — BASIC METABOLIC PANEL
ANION GAP: 9 (ref 5–15)
BUN: 24 mg/dL — ABNORMAL HIGH (ref 6–20)
CALCIUM: 8 mg/dL — AB (ref 8.9–10.3)
CO2: 30 mmol/L (ref 22–32)
Chloride: 112 mmol/L — ABNORMAL HIGH (ref 101–111)
Creatinine, Ser: 1.16 mg/dL — ABNORMAL HIGH (ref 0.44–1.00)
GFR calc Af Amer: 50 mL/min — ABNORMAL LOW (ref >60)
GFR, EST NON AFRICAN AMERICAN: 43 mL/min — AB (ref >60)
GLUCOSE: 107 mg/dL — AB (ref 65–99)
Potassium: 3 mmol/L — ABNORMAL LOW (ref 3.5–5.1)
SODIUM: 151 mmol/L — AB (ref 135–145)

## 2017-06-02 LAB — CBC WITH DIFFERENTIAL/PLATELET
BASOS ABS: 0 10*3/uL (ref 0.0–0.1)
BASOS PCT: 0 %
EOS ABS: 0 10*3/uL (ref 0.0–0.7)
EOS PCT: 0 %
HCT: 29.8 % — ABNORMAL LOW (ref 36.0–46.0)
Hemoglobin: 9 g/dL — ABNORMAL LOW (ref 12.0–15.0)
LYMPHS PCT: 14 %
Lymphs Abs: 0.8 10*3/uL (ref 0.7–4.0)
MCH: 27.7 pg (ref 26.0–34.0)
MCHC: 30.2 g/dL (ref 30.0–36.0)
MCV: 91.7 fL (ref 78.0–100.0)
MONO ABS: 0.6 10*3/uL (ref 0.1–1.0)
Monocytes Relative: 11 %
Neutro Abs: 4 10*3/uL (ref 1.7–7.7)
Neutrophils Relative %: 75 %
PLATELETS: 154 10*3/uL (ref 150–400)
RBC: 3.25 MIL/uL — ABNORMAL LOW (ref 3.87–5.11)
RDW: 17.3 % — AB (ref 11.5–15.5)
WBC: 5.4 10*3/uL (ref 4.0–10.5)

## 2017-06-02 LAB — PHOSPHORUS: PHOSPHORUS: 2.1 mg/dL — AB (ref 2.5–4.6)

## 2017-06-02 LAB — MAGNESIUM: MAGNESIUM: 1.9 mg/dL (ref 1.7–2.4)

## 2017-06-02 MED ORDER — RESOURCE THICKENUP CLEAR PO POWD
ORAL | Status: DC | PRN
  Filled 2017-06-02: qty 125

## 2017-06-02 MED ORDER — POTASSIUM CHLORIDE 10 MEQ/50ML IV SOLN
10.0000 meq | INTRAVENOUS | Status: AC
  Administered 2017-06-02 (×6): 10 meq via INTRAVENOUS
  Filled 2017-06-02 (×5): qty 50

## 2017-06-02 MED ORDER — SODIUM CHLORIDE 0.45 % IV SOLN
INTRAVENOUS | Status: DC
  Administered 2017-06-02: 10:00:00 via INTRAVENOUS

## 2017-06-02 NOTE — Evaluation (Signed)
Physical Therapy Evaluation Patient Details Name: Brooke Keller MRN: [id number] DOB: January 02, 1935 Today's Date: 06/02/2017   History of Present Illness  Pt is an 82 y.o. female s/p R axillobifemoral bypass graft on 8/7. Post op respiratory issues and transfer to ICU and on Bipap for several days.  PMHx: CHF, COPD, DM, GERD, HTN, Multiple myeloma.  Past Medical History:  Diagnosis Date  . Anemia   . CHF (congestive heart failure) ([place])   . COPD (chronic obstructive pulmonary disease) ([place])   . Diabetes mellitus    type 2  . GERD (gastroesophageal reflux disease)   . Hypertension   . IgG multiple myeloma ([place]) 04/14/2016  . Multiple myeloma   . Peripheral vascular disease ([place])   . Supplemental oxygen dependent    2L o2 she should be coming off oxygen soon    Clinical Impression  Pt admitted with above diagnosis. Pt currently with functional limitations due to the deficits listed below (see PT Problem List). Pt was able to squat pivot to chair with +2 max assist. Much weaker than previous evaluation prior to respiratory issues.  May need SNF at d/c.  Will follow acutely. Pt will benefit from skilled PT to increase their independence and safety with mobility to allow discharge to the venue listed below.      Follow Up Recommendations Supervision/Assistance - 24 hour;SNF    Equipment Recommendations  None recommended by PT    Recommendations for Other Services       Precautions / Restrictions Precautions Precautions: Fall Restrictions Weight Bearing Restrictions: No      Mobility  Bed Mobility Overal bed mobility: Needs Assistance Bed Mobility: Supine to Sit;Sit to Supine     Supine to sit: Max assist;+2 for physical assistance;HOB elevated     General bed mobility comments: Pt requires assist to move LEs off EOB and to lift trunk.  WHen she fatigues, she falls back onto bed in full extension requiring total A to lift LEs back onto bed and max A to rotate trunk into correct  position   Transfers Overall transfer level: Needs assistance Equipment used: 2 person hand held assist Transfers: Sit to/from [employer] Sit to Stand: Max assist;+2 physical assistance;From elevated surface   Squat pivot transfers: Max assist;+2 physical assistance;From elevated surface     General transfer comment: Pt needed assist to power up from bed.  Needed max assist to maintain stand and could not move feet for pivot therefore pt sat back onto bed.  Dropped armrest of chair and second attempt pt was able to pivot with assist at pelvis to keep pt standing with hand under pts right buttocks also assisting with right LE movement for squat pivot transfer.    Ambulation/Gait                Stairs            Wheelchair Mobility    Modified Rankin (Stroke Patients Only)       Balance Overall balance assessment: Needs assistance Sitting-balance support: Bilateral upper extremity supported Sitting balance-Leahy Scale: Poor Sitting balance - Comments: cannot sit upright without mod to max assit with posterior lean  Postural control: Posterior lean Standing balance support: Bilateral upper extremity supported Standing balance-Leahy Scale: Poor Standing balance comment: bil UE support with max assist of 2 to maintain standing.  Pertinent Vitals/Pain Pain Assessment: Faces Faces Pain Scale: Hurts little more Pain Location: generalized Pain Descriptors / Indicators: Grimacing Pain Intervention(s): Limited activity within patient's tolerance;Monitored during session;Premedicated before session;Repositioned  VSS on 6LHFNC.   Home Living Family/patient expects to be discharged to:: Private residence Living Arrangements: Spouse/significant other Available Help at Discharge: Family;Available 24 hours/day Type of Home: House Home Access: Level entry     Home Layout: Multi-level;Able to live on main level with  bedroom/bathroom (pt and husband live in basement) Home Equipment: Walker - 2 wheels;Bedside commode;Shower seat;Grab bars - tub/shower;Wheelchair - manual;Hospital bed (3L home O2)      Prior Function Level of Independence: Needs assistance   Gait / Transfers Assistance Needed: ambulating short distances with HHA  ADL's / Homemaking Assistance Needed: family assisting with ADL        Hand Dominance        Extremity/Trunk Assessment   Upper Extremity Assessment Upper Extremity Assessment: Defer to OT evaluation    Lower Extremity Assessment Lower Extremity Assessment: RLE deficits/detail;LLE deficits/detail RLE Deficits / Details: grossly 3-/5 LLE Deficits / Details: grossly 3-/5    Cervical / Trunk Assessment Cervical / Trunk Assessment: Kyphotic  Communication   Communication: No difficulties  Cognition Arousal/Alertness: Awake/alert;Lethargic Behavior During Therapy: WFL for tasks assessed/performed Overall Cognitive Status: Impaired/Different from baseline                                 General Comments: Pt follows commands for exercise.         General Comments General comments (skin integrity, edema, etc.): family present     Exercises Low Level/ICU Exercises Ankle Circles/Pumps: AROM;Both;15 reps;Supine Quad Sets: AROM;Right;Left;10 reps;Supine   Assessment/Plan    PT Assessment Patient needs continued PT services  PT Problem List Decreased strength;Decreased activity tolerance;Decreased mobility;Pain;Decreased knowledge of use of DME;Decreased safety awareness       PT Treatment Interventions Gait training;Functional mobility training;Therapeutic activities;DME instruction;Patient/family education    PT Goals (Current goals can be found in the Care Plan section)  Acute Rehab PT Goals Patient Stated Goal: return home PT Goal Formulation: With patient Time For Goal Achievement: 06/18/17 Potential to Achieve Goals: Good     Frequency Min 3X/week   Barriers to discharge        Co-evaluation               AM-PAC PT "6 Clicks" Daily Activity  Outcome Measure Difficulty turning over in bed (including adjusting bedclothes, sheets and blankets)?: Unable Difficulty moving from lying on back to sitting on the side of the bed? : Unable Difficulty sitting down on and standing up from a chair with arms (e.g., wheelchair, bedside commode, etc,.)?: Unable Help needed moving to and from a bed to chair (including a wheelchair)?: A Lot Help needed walking in hospital room?: Total Help needed climbing 3-5 steps with a railing? : Total 6 Click Score: 7    End of Session Equipment Utilized During Treatment: Gait belt;Oxygen Activity Tolerance: Patient tolerated treatment well;Patient limited by pain Patient left: with call bell/phone within reach;with family/visitor present;in chair;with chair alarm set Nurse Communication: Mobility status;Need for lift equipment PT Visit Diagnosis: Unsteadiness on feet (R26.81);Other abnormalities of gait and mobility (R26.89);Muscle weakness (generalized) (M62.81);Pain Pain - part of body:  (generalized)    Time: 0626-9485 PT Time Calculation (min) (ACUTE ONLY): 14 min   Charges:   PT Evaluation $PT Eval Moderate Complexity: 1 Mod  PT G CodesAmanda Cockayne Acute Rehabilitation [phone] (pager)   Denice Paradise 06/02/2017, 2:22 PM

## 2017-06-02 NOTE — Progress Notes (Signed)
National Pulmonary & Critical Care Note  ADMISSION DATE:  05/20/2017  CONSULTATION DATE: 05/24/2017  REFERRING MD:  Servando Snare, M.D. / Vascular Surgery   Presenting HPI:  82 y.o. female with history of COPD, chronic hypoxic respiratory failure, diabetes mellitus type 2, congestive heart failure, hypertension, and multiple myeloma. Patient underwent bypass surgery for non-healing ulcer of the ankle. Patient developed subsequent abdominal distention and ileus but refused NG tube placement. Patient became more altered and hypoxic overnight with nausea and vomiting. Patient had subsequent probable aspiration. Patient was noted to be in atrial fibrillation with rapid ventricular response and hypertensive. Metoprolol and Lasix were administered. Patient was transferred to the ICU and placed on noninvasive positive pressure ventilation. Patient's respiratory status remained marginal at best and ultimately she was endotracheally intubated.  Subjective:  Patient transitioned off BiPAP overnight. Very thirsty and altered again this morning. Patient denies any pain or difficulty breathing on Venturi mask. Denies any abdominal pain or nausea. Still somewhat altered.  Review of Systems:  Unable to obtain with altered mental status.  FiO2 (%):  [30 %-40 %] 30 %  Temp:  [96.3 F (35.7 C)-99.6 F (37.6 C)] 97.6 F (36.4 C) (08/20 0800) Pulse Rate:  [35-75] 59 (08/20 0800) Resp:  [14-50] 20 (08/20 0800) BP: (89-171)/(45-101) 158/58 (08/20 0800) SpO2:  [92 %-100 %] 99 % (08/20 0800) FiO2 (%):  [30 %-40 %] 30 % (08/20 0507) Weight:  [132 lb 4.4 oz (60 kg)] 132 lb 4.4 oz (60 kg) (08/20 0342)   General:  Awake. Alert. No distress. Grandson at bedside. Integument:  Warm & dry. No rash on exposed skin. Extremities:  No cyanosis.  HEENT:  No scleral injection or icterus. Venturi mask in place. Cardiovascular:  Regular rate. Mild pedal edema. Unable to appreciate JVD.  Pulmonary:  Diminished breath sounds  bilateral lung bases. Normal breathing on Venturi mask. Abdomen: Soft. Normal bowel sounds. Nondistended. Nontender. Neurological: Moving all 4 extremities. Oriented to person, place, and president but not year.  LINES/TUBES: OETT 8/11 - 8/15 NGT (out 8/16) L [place] CVL 8/11 >> IMPLANTED PORT >> Foley >> PIV  CBC Latest Ref Rng & Units 06/02/2017 06/01/2017 05/31/2017  WBC 4.0 - 10.5 K/uL 5.4 6.6 8.0  Hemoglobin 12.0 - 15.0 g/dL 9.0(L) 9.3(L) 9.5(L)  Hematocrit 36.0 - 46.0 % 29.8(L) 29.1(L) 29.8(L)  Platelets 150 - 400 K/uL 154 138(L) 121(L)   BMP Latest Ref Rng & Units 06/02/2017 06/01/2017 06/01/2017  Glucose 65 - 99 mg/dL 107(H) 137(H) 132(H)  BUN 6 - 20 mg/dL 24(H) 34(H) 35(H)  Creatinine 0.44 - 1.00 mg/dL 1.16(H) 1.53(H) 1.51(H)  Sodium 135 - 145 mmol/L 151(H) 147(H) 147(H)  Potassium 3.5 - 5.1 mmol/L 3.0(L) 3.4(L) 3.4(L)  Chloride 101 - 111 mmol/L 112(H) 109 110  CO2 22 - 32 mmol/L 30 25 24   Calcium 8.9 - 10.3 mg/dL 8.0(L) 7.7(L) 7.7(L)   Hepatic Function Latest Ref Rng & Units 06/02/2017 06/01/2017 05/31/2017  Total Protein 6.5 - 8.1 g/dL - - -  Albumin 3.5 - 5.0 g/dL 2.4(L) 2.5(L) 2.2(L)  AST 15 - 41 U/L - - -  ALT 14 - 54 U/L - - -  Alk Phosphatase 38 - 126 U/L - - -  Total Bilirubin 0.3 - 1.2 mg/dL - - -    IMAGING/STUDIES: CT CHEST W/O 8/11: IMPRESSION: 1. Dense consolidation of the right lower lobe, and patchy airspace opacities within the remainder of the right lung and left lower lobe, compatible with multifocal pneumonia. 2. Trace right-sided  pleural fluid noted. 3. Diffuse coronary artery calcifications noted. 4. An incidental finding of potential clinical significance has been found. Aneurysmal dilatation of the infrarenal abdominal aorta to 3.3 cm in AP dimension. Recommend followup by ultrasound in 3 years. This recommendation follows ACR consensus guidelines: White Paper of the ACR Incidental Findings Committee II on Vascular Findings. J Am Coll Radiol 2013;  85:277-824 5. Diffuse aortic atherosclerosis. 6. 1.2 cm nonspecific hypodensity at the right hepatic lobe. 7. Chronic compression deformities at T4, T5, T6, T7, T10 and L1. PORT ABD X-RAY 8/12:  Decreased small bowel dilatation since previous study. PORT CXR 8/12:  Right lower lobe opacity with chronic elevation of right hemidiaphragm. Somewhat lordotic and rotated film. Endotracheal tube in good position. Left central venous catheter in good position. Enteric feeding tube coursing below diaphragm. PORT CXR 8/14:  Previously reviewed by me. Persistent patchy bilateral opacities, right predominant, relatively unchanged when compared with x-ray imaging from 8/12. No new pleural effusion. Endotracheal tube and left central venous catheter in good position. Enteric feeding tube coursing below diaphragm. PORT CXR 8/15:  Previously reviewed by me. Central venous catheter in good position. Enteric feeding tube coursing below diaphragm. Some slight worsening in bilateral pleural effusions versus atelectasis. PORT CXR 8/20:  Personally reviewed by me. Bilateral lower lung opacities & silhouetting of hemidiaphragm. Port and CVC unchanged.   MICROBIOLOGY: MRSA PCR 7/25:  Positive  Blood Cultures x2 8/12:  Negative  Urine Culture 8/12:  Negative  Tracheal Aspirate Culture 8/12:  Moderate Klebsiella pneumoniae & Enterobacter cloacae   ANTIBIOTICS: Zyvox 8/11 - 8/14 Zosyn 8/11 - 8/15 Rocephin 8/15 (x1 dose) Cefepime 8/16 >>>  SIGNIFICANT EVENTS: 08/07 - Admit 08/11 - Transferred to ICU w/ respiratory failure & atrial fibrillation >> failed BiPAP & was intubated 08/12 - Tolerated 8 hours of PS 12/5 weaning  08/14 - Bowel movements restarted 08/16 - Hypoglycemic to 64 overnight. 1amp of Bicarb given for acidosis. Increased WOB>>BiPAP. Normal Sinus Rhythm.  ASSESSMENT/PLAN:    PULMONARY A: Acute hypoxic respiratory failure: Improving slowly. Question contribution of pulmonary edema. Klebsiella &  Enterobacter Pneumonia/Aspiration Pneumonia History of COPD  P:   Continuing to wean FiO2 Continuing pulmonary toilet with incentive spirometry Continuing Duonebs every 6 hours Continuing BiPAP qhs (in place of home machine) BiPAP prn work of breathing Holding on chest CT See ID Section  CARDIOVASCULAR A:  Shock: Likely secondary to sepsis. Resolved. Peripheral vascular disease: Status post bypass. Atrial fibrillation:  New diagnosis. Converted to normal sinus rhythm.  P:  Continue telemetry monitoring Monitoring vitals. A protocol Goal MAP >65 & SBP >90 Continuing amiodarone 400 mg by mouth daily when able to swallow Lopressor IV q6hr scheduled for now Continuing aspirin by mouth daily Postoperative care per vascular surgery Leaving central venous catheter in place given limited IV access  RENAL A:   Acute on chronic renal failure: Steadily improving. Hypernatremia:  Steadily worsening. 1.1 L deficit today. Hypokalemia: Replaced. Hyperphosphatemia: Resolved. Metabolic acidosis: Resolved.  P:   KCl 72mq IV x6 runs Holding on further diuresis Monitoring UOP with foley - leaving foley in place Trending renal function & electrolytes daily  [facility]  GASTROINTESTINAL A:   Ileus: Resolved.  P:   NPO  Speech evaluation pending  HEMATOLOGIC/ONCOLOGIC A:   Leukocytosis:  Resolved. Pancytopenia: Resolving. Likely multifactorial from sepsis and the Zyvox. H/O IgG Multiple Myeloma  P:  Multiple myeloma treatment on hold Continuing to trend cell counts daily with CBC  INFECTIOUS A:   Sepsis:  Resolving. Klebsiella & Enterobacter Pneumonia:  Secondary to aspiration.  P:   Currently on day #5/10 of cefepime Plan to reculture for fever  ENDOCRINE A:   Diabetes Mellitus: Glucose stable. Chronic Prednisone    P:   Holding home Prednisone & Metformin Accu-Cheks every 4 hours while primarily nothing by mouth Sliding-scale  insulin per algorithm Continuing hydrocortisone 50 mg IV every 12 hours & plan for a taper back to her basal Prednisone dose  NEUROLOGIC A:   Delirium:  Continuing.  Post-op Pain Chronic Benzodiazepine Use  P:   Tylenol prn Pain  Fentanyl IV prn Severe Pain Avoiding sedating medications  Prophylaxis:  Heparin subcutaneous every 8 hour, SCDs, & Protonix IV daily. Diet:  NPO except ice chips. Speech consulted. Code Status:  Full code Disposition:  Transferring patient to SDU if ok with Vascular Surgery. PT & OT consulted. Family Update:  Patient & grandson updated at the time of my rounds.  DISCUSSION:  83 y.o. female status post bypass with postoperative course complicated by acute respiratory failure with aspiration and pneumonia. Successfully consolidated antibiotics to cefepime. Continuing to wean noninvasive positive pressure ventilation to nighttime only for respiratory support just as with home regimen. Awaiting speech therapy consult before advancing diet. Atrial fibrillation currently rate controlled. Slow but continuous clinical improvement.  I have spent a total of 37 minutes of time today caring for the patient, reviewing the patient's electronic medical record, and with more than 50% of that time spent coordinating care with the patient as well as reviewing the continuing plan of care with the patient's family at bedside.  Remainder of care as per primary service. PCCM will sign off & [facility] will take over medical care starting 8/21.   Sonia Baller Ashok Cordia, M.D. [facility]monary [facility] Pager:  [phone] After 3pm or if no response, call [phone] 06/02/2017, 8:47 AM

## 2017-06-02 NOTE — Progress Notes (Addendum)
Progress Note  SUBJECTIVE:     Off bipap this am. Wants something to drink.   OBJECTIVE:   Vitals:   06/02/17 0753 06/02/17 0800  BP:  (!) 158/58  Pulse:  (!) 59  Resp:  20  Temp:  97.6 F (36.4 C)  SpO2: 100% 99%    Intake/Output Summary (Last 24 hours) at 06/02/17 0850 Last data filed at 06/02/17 0700  Gross per 24 hour  Intake              410 ml  Output             2410 ml  Net            -2000 ml  Normal respiratory effort. With venturi mask.  Right infraclavicular incision clean and intact. Bilateral groin incisions with minimal maceration.  Bilateral foot dressings clean. Were changed earlier this am. Feet and toes are warm.   ASSESSMENT/PLAN:   82 y.o. female is s/p: right axillobifemoral bypass 13 Days Post-Op   Off bipap this am. CXR shows persistent bilateral pleural effusions right worse than left. Per PCCM. Appreciate assistance with this patient.  NPO until cleared by SLP. Continue dry gauze to bilateral groin incisions to wick moisture. Santyl to bilateral foot wounds. Creatinine improving.  Hypokalemia repleted.   Brooke Keller 06/02/2017 8:50 AM -- LABS:   CBC    Component Value Date/Time   WBC 5.4 06/02/2017 0400   HGB 9.0 (L) 06/02/2017 0400   HCT 29.8 (L) 06/02/2017 0400   PLT 154 06/02/2017 0400    BMET    Component Value Date/Time   NA 151 (H) 06/02/2017 0400   K 3.0 (L) 06/02/2017 0400   CL 112 (H) 06/02/2017 0400   CO2 30 06/02/2017 0400   GLUCOSE 107 (H) 06/02/2017 0400   BUN 24 (H) 06/02/2017 0400   CREATININE 1.16 (H) 06/02/2017 0400   CALCIUM 8.0 (L) 06/02/2017 0400   GFRNONAA 43 (L) 06/02/2017 0400   GFRAA 50 (L) 06/02/2017 0400    COAG Lab Results  Component Value Date   INR 1.06 05/07/2017   INR 0.94 10/15/2012   INR 1.01 10/01/2011   No results found for: PTT  ANTIBIOTICS:   Anti-infectives    Start     Dose/Rate Route Frequency Ordered Stop   05/29/17 1000  ceFEPIme (MAXIPIME) 1 g in dextrose 5 %  50 mL IVPB     1 g 100 mL/hr over 30 Minutes Intravenous Every 24 hours 05/29/17 0832     05/28/17 1200  cefTRIAXone (ROCEPHIN) 2 g in dextrose 5 % 50 mL IVPB  Status:  Discontinued     2 g 100 mL/hr over 30 Minutes Intravenous Every 24 hours 05/28/17 0737 05/29/17 0832   05/26/17 1200  piperacillin-tazobactam (ZOSYN) IVPB 3.375 g  Status:  Discontinued     3.375 g 12.5 mL/hr over 240 Minutes Intravenous Every 8 hours 05/26/17 1042 05/28/17 0737   05/25/17 1000  piperacillin-tazobactam (ZOSYN) IVPB 2.25 g  Status:  Discontinued     2.25 g 100 mL/hr over 30 Minutes Intravenous Every 8 hours 05/25/17 0745 05/26/17 1042   05/24/17 1800  linezolid (ZYVOX) IVPB 600 mg  Status:  Discontinued     600 mg 300 mL/hr over 60 Minutes Intravenous Every 12 hours 05/24/17 1709 05/27/17 0817   05/24/17 1800  piperacillin-tazobactam (ZOSYN) IVPB 3.375 g  Status:  Discontinued     3.375 g 12.5 mL/hr over 240 Minutes Intravenous Every 8 hours  05/24/17 1713 05/25/17 0745   05/24/17 1715  piperacillin-tazobactam (ZOSYN) IVPB 3.375 g  Status:  Discontinued     3.375 g 100 mL/hr over 30 Minutes Intravenous Every 8 hours 05/24/17 1709 05/24/17 1712   05/20/17 2200  cefUROXime (ZINACEF) 1.5 g in dextrose 5 % 50 mL IVPB     1.5 g 100 mL/hr over 30 Minutes Intravenous Every 12 hours 05/20/17 1756 05/21/17 1034   05/20/17 0850  dextrose 5 % with cefUROXime (ZINACEF) ADS Med    Comments:  Ardine Eng   : cabinet override      05/20/17 0850 05/20/17 1033   05/20/17 0837  cefUROXime (ZINACEF) 1.5 g in dextrose 5 % 50 mL IVPB     1.5 g 100 mL/hr over 30 Minutes Intravenous 30 min pre-op 05/20/17 0837 05/20/17 [place], PA-C [facility] Office: [phone] Pager: [phone] 06/02/2017 8:50 AM  I have interviewed the patient and examined the patient. I agree with the findings by the PA. Swallowing eval pending before she can eat.  OK to transfer to 4E  Gae Gallop, MD [phone]

## 2017-06-02 NOTE — Progress Notes (Signed)
[place] Progress Note Patient Name: Brooke Keller DOB: 1934-12-12 MRN: [id number]   Date of Service  06/02/2017  HPI/Events of Note  K+ = 3.0 and Creatinine = 1.16.   eICU Interventions  Will replace K+.     Intervention Category Major Interventions: Electrolyte abnormality - evaluation and management  Sommer,Steven Eugene 06/02/2017, 5:05 AM

## 2017-06-02 NOTE — Plan of Care (Signed)
Problem: Skin Integrity: Goal: Risk for impaired skin integrity will decrease Outcome: Progressing No further skin tears/breakdown  Problem: Activity: Goal: Risk for activity intolerance will decrease Outcome: Progressing OOB to chair  Problem: Coping: Goal: Level of anxiety will decrease Outcome: Progressing Patient resting peacefully; decreased anxiety/agitation  Problem: Nutritional: Goal: Intake of prescribed amount of daily calories will improve Outcome: Progressing Diet advanced DYS1; carb mod; Fluid Restriction 1200; Honey thick  Problem: Respiratory: Goal: Ability to maintain a clear airway and adequate ventilation will improve Outcome: Progressing Weaned from bipap to venturi to HFNC @ 4L; NAD; RR 16-20; Sats>93

## 2017-06-02 NOTE — Progress Notes (Signed)
Education given to family on importance of following required contact precautions. Family aware and refusing to wear gowns. Education given. Will continue to monitor.

## 2017-06-02 NOTE — Progress Notes (Signed)
  Speech Language Pathology Treatment: Dysphagia  Patient Details Name: LINET BRASH MRN: [id number] DOB: 06/06/1935 Today's Date: 06/02/2017 Time: 1194-1740 SLP Time Calculation (min) (ACUTE ONLY): 20 min  Assessment / Plan / Recommendation  Clinical Impression  Pt alert, repeatedly requesting "apples". At rest during respirations no significant congestion noted. Immediate and delayed throat clear and cough with thin water, multiple swallows indicative of possible pharyngeal residue and suspect delayed swallow. Honey thick liquids mitigated s/s aspiration to one subtle throat clear out of 4 trials. Consumed meds crushed in applesauce without overt difficulty per RN. Recommend pt initiate Dys 1 (puree), honey thick liquids. Reinterated pt needs close observation as she continues to be at higher aspiration risk due to intubation, clinical signs and history of COPD. Educated Therapist, sports to stop po's if pt exhibits frequent and consistent s/s aspiration. ST will continue to follow.    HPI HPI: 82 year old female admitted 05/20/17 for bypass surgery on ankle ulcer. Intubated for 5 days and extubated 8/15. PMH significant for COPD, chronic hypoxic respiratory failure, DM2, CHF, HTN, multiple myeloma. CXR = bilateral pleural effusions with underlying atelectasis, worsened on the left, pulmonary edema.      SLP Plan  Continue with current plan of care       Recommendations  Diet recommendations: Dysphagia 1 (puree);Honey-thick liquid Liquids provided via: Cup;No straw Medication Administration: Crushed with puree Supervision: Patient able to self feed;Full supervision/cueing for compensatory strategies Compensations: Minimize environmental distractions;Slow rate;Small sips/bites Postural Changes and/or Swallow Maneuvers: Seated upright 90 degrees;Upright 30-60 min after meal                Oral Care Recommendations: Oral care BID Follow up Recommendations: Skilled Nursing facility SLP Visit  Diagnosis: Dysphagia, unspecified (R13.10) Plan: Continue with current plan of care       [place], Silas Muff Willis 06/02/2017, 10:22 AM   Orbie Pyo Colvin Caroli.Ed [employer] [phone]

## 2017-06-03 ENCOUNTER — Other Ambulatory Visit (HOSPITAL_COMMUNITY): Payer: Self-pay

## 2017-06-03 DIAGNOSIS — N170 Acute kidney failure with tubular necrosis: Secondary | ICD-10-CM

## 2017-06-03 DIAGNOSIS — E870 Hyperosmolality and hypernatremia: Secondary | ICD-10-CM

## 2017-06-03 DIAGNOSIS — J150 Pneumonia due to Klebsiella pneumoniae: Secondary | ICD-10-CM

## 2017-06-03 DIAGNOSIS — D61818 Other pancytopenia: Secondary | ICD-10-CM

## 2017-06-03 DIAGNOSIS — R570 Cardiogenic shock: Secondary | ICD-10-CM

## 2017-06-03 DIAGNOSIS — J690 Pneumonitis due to inhalation of food and vomit: Secondary | ICD-10-CM

## 2017-06-03 DIAGNOSIS — N183 Chronic kidney disease, stage 3 (moderate): Secondary | ICD-10-CM

## 2017-06-03 DIAGNOSIS — E876 Hypokalemia: Secondary | ICD-10-CM

## 2017-06-03 DIAGNOSIS — R400 Somnolence: Secondary | ICD-10-CM

## 2017-06-03 DIAGNOSIS — I4891 Unspecified atrial fibrillation: Secondary | ICD-10-CM

## 2017-06-03 DIAGNOSIS — E1121 Type 2 diabetes mellitus with diabetic nephropathy: Secondary | ICD-10-CM

## 2017-06-03 DIAGNOSIS — I739 Peripheral vascular disease, unspecified: Secondary | ICD-10-CM

## 2017-06-03 DIAGNOSIS — E2740 Unspecified adrenocortical insufficiency: Secondary | ICD-10-CM

## 2017-06-03 LAB — GLUCOSE, CAPILLARY
GLUCOSE-CAPILLARY: 100 mg/dL — AB (ref 65–99)
GLUCOSE-CAPILLARY: 158 mg/dL — AB (ref 65–99)
GLUCOSE-CAPILLARY: 150 mg/dL — AB (ref 65–99)
Glucose-Capillary: 121 mg/dL — ABNORMAL HIGH (ref 65–99)
Glucose-Capillary: 149 mg/dL — ABNORMAL HIGH (ref 65–99)
Glucose-Capillary: 117 mg/dL — ABNORMAL HIGH (ref 65–99)

## 2017-06-03 LAB — CBC WITH DIFFERENTIAL/PLATELET
BASOS ABS: 0 10*3/uL (ref 0.0–0.1)
BASOS PCT: 0 %
EOS PCT: 0 %
Eosinophils Absolute: 0 10*3/uL (ref 0.0–0.7)
HCT: 26.6 % — ABNORMAL LOW (ref 36.0–46.0)
Hemoglobin: 7.9 g/dL — ABNORMAL LOW (ref 12.0–15.0)
LYMPHS PCT: 7 %
Lymphs Abs: 0.3 10*3/uL — ABNORMAL LOW (ref 0.7–4.0)
MCH: 27.8 pg (ref 26.0–34.0)
MCHC: 29.7 g/dL — ABNORMAL LOW (ref 30.0–36.0)
MCV: 93.7 fL (ref 78.0–100.0)
Monocytes Absolute: 0.8 10*3/uL (ref 0.1–1.0)
Monocytes Relative: 17 %
NEUTROS ABS: 3.5 10*3/uL (ref 1.7–7.7)
Neutrophils Relative %: 76 %
PLATELETS: 132 10*3/uL — AB (ref 150–400)
RBC: 2.84 MIL/uL — AB (ref 3.87–5.11)
RDW: 17.3 % — AB (ref 11.5–15.5)
WBC: 4.6 10*3/uL (ref 4.0–10.5)

## 2017-06-03 LAB — RENAL FUNCTION PANEL
ALBUMIN: 2.2 g/dL — AB (ref 3.5–5.0)
Anion gap: 6 (ref 5–15)
BUN: 18 mg/dL (ref 6–20)
CALCIUM: 7.9 mg/dL — AB (ref 8.9–10.3)
CHLORIDE: 111 mmol/L (ref 101–111)
CO2: 31 mmol/L (ref 22–32)
CREATININE: 0.89 mg/dL (ref 0.44–1.00)
GFR, EST NON AFRICAN AMERICAN: 60 mL/min — AB (ref >60)
Glucose, Bld: 112 mg/dL — ABNORMAL HIGH (ref 65–99)
PHOSPHORUS: 2.5 mg/dL (ref 2.5–4.6)
Potassium: 3 mmol/L — ABNORMAL LOW (ref 3.5–5.1)
Sodium: 148 mmol/L — ABNORMAL HIGH (ref 135–145)

## 2017-06-03 LAB — MAGNESIUM: Magnesium: 1.7 mg/dL (ref 1.7–2.4)

## 2017-06-03 MED ORDER — IPRATROPIUM-ALBUTEROL 0.5-2.5 (3) MG/3ML IN SOLN
3.0000 mL | Freq: Three times a day (TID) | RESPIRATORY_TRACT | Status: DC
  Administered 2017-06-03 – 2017-06-06 (×9): 3 mL via RESPIRATORY_TRACT
  Filled 2017-06-03 (×10): qty 3

## 2017-06-03 MED ORDER — POTASSIUM CHLORIDE 10 MEQ/50ML IV SOLN
10.0000 meq | INTRAVENOUS | Status: AC
  Administered 2017-06-03 (×4): 10 meq via INTRAVENOUS
  Filled 2017-06-03 (×5): qty 50

## 2017-06-03 MED ORDER — DEXTROSE 5 % IV SOLN
INTRAVENOUS | Status: DC
  Administered 2017-06-03: 13:00:00 via INTRAVENOUS

## 2017-06-03 MED ORDER — MAGNESIUM SULFATE 2 GM/50ML IV SOLN
2.0000 g | Freq: Once | INTRAVENOUS | Status: AC
  Administered 2017-06-03: 2 g via INTRAVENOUS
  Filled 2017-06-03: qty 50

## 2017-06-03 MED ORDER — MAGNESIUM SULFATE 50 % IJ SOLN
1.0000 g | Freq: Once | INTRAMUSCULAR | Status: DC
Start: 2017-06-03 — End: 2017-06-03
  Filled 2017-06-03: qty 2

## 2017-06-03 MED ORDER — DEXTROSE 5 % IV SOLN
2.0000 g | INTRAVENOUS | Status: DC
  Administered 2017-06-03 – 2017-06-04 (×2): 2 g via INTRAVENOUS
  Filled 2017-06-03 (×2): qty 2

## 2017-06-03 NOTE — Progress Notes (Signed)
Pharmacy Antibiotic Note  Brooke Keller is a 82 y.o. female admitted on 05/20/2017 with pneumonia. Pt on cefepime for enterobacter/klebsiella PNA.  SCr now improved with CrCl ~35 ml/min.  Plan: -Increase cefepime to 2g IV q24h -Monitor renal function, LOT  Height: 4\' 7"  (139.7 cm) Weight: 132 lb 4.4 oz (60 kg) IBW/kg (Calculated) : 34  Temp (24hrs), Avg:97.8 F (36.6 C), Min:96.8 F (36 C), Max:98.5 F (36.9 C)   Recent Labs Lab 05/28/17 0349  05/29/17 1113 05/29/17 1844 05/29/17 2046 05/30/17 0332  05/31/17 0319 05/31/17 1802 06/01/17 0300 06/02/17 0400 06/03/17 0316  WBC 6.3  < >  --   --   --  9.6  --  8.0  --  6.6 5.4 4.6  CREATININE 1.42*  < >  --   --   --  1.83*  < > 1.62* 1.52* 1.53*  1.51* 1.16* 0.89  LATICACIDVEN 1.9  --  1.0 1.1 1.2  --   --   --   --   --   --   --   < > = values in this interval not displayed.  Estimated Creatinine Clearance: 35.3 mL/min (by C-G formula based on SCr of 0.89 mg/dL).    Allergies  Allergen Reactions  . Ativan [Lorazepam] Other (See Comments)    hallucinations  . Amitriptyline Other (See Comments)    HALLUCINATIONS    Antimicrobials this admission: 8/11 Linezolid>>8/14 8/11 Zosyn>>8/15 8/15 CTX >>8/16 8/16 Cefepime >>  Dose adjustments this admission: n/a  Microbiology results: 8/12 TA: moderate Kleb pneumoniae, Enterobacter 8/12 BCx: Neg   Thank you for allowing pharmacy to be a part of this patient's care.  Arrie Senate, PharmD PGY-2 Cardiology Pharmacy Resident Pager: [phone] 06/03/2017

## 2017-06-03 NOTE — Progress Notes (Addendum)
Progress Note  SUBJECTIVE:    Denies any pain. Wants some cold water.  OBJECTIVE:   Vitals:   06/03/17 0600 06/03/17 0752  BP: (!) 151/53   Pulse: (!) 55   Resp: 19   Temp:    SpO2: 98% 98%    Intake/Output Summary (Last 24 hours) at 06/03/17 0807 Last data filed at 06/03/17 0646  Gross per 24 hour  Intake              880 ml  Output             1081 ml  Net             -201 ml   Breathing comfortably in NAD Right infraclavicular incision and bilateral groin incisions clean. Mild maceration right groin incision.  Dressings on feet clean bilaterally. Feet are warm with motor and sensory function intact.  ASSESSMENT/PLAN:   82 y.o. female is s/p: right axillobifemoral bypass 14 Days Post-Op   Oxygen requirements improving.  Currently on dysphagia 1 diet per SLP.  Continue wound care to groins and bilateral feet.  Continue to mobilize.  Transfer to 4E.  Appreciate PCCM and medical service assistance with this patient.    [name] 06/03/2017 8:07 AM -- LABS:   CBC    Component Value Date/Time   WBC 4.6 06/03/2017 0316   HGB 7.9 (L) 06/03/2017 0316   HCT 26.6 (L) 06/03/2017 0316   PLT 132 (L) 06/03/2017 0316    BMET    Component Value Date/Time   NA 148 (H) 06/03/2017 0316   K 3.0 (L) 06/03/2017 0316   CL 111 06/03/2017 0316   CO2 31 06/03/2017 0316   GLUCOSE 112 (H) 06/03/2017 0316   BUN 18 06/03/2017 0316   CREATININE 0.89 06/03/2017 0316   CALCIUM 7.9 (L) 06/03/2017 0316   GFRNONAA 60 (L) 06/03/2017 0316   GFRAA >60 06/03/2017 0316    COAG Lab Results  Component Value Date   INR 1.06 05/07/2017   INR 0.94 10/15/2012   INR 1.01 10/01/2011   No results found for: PTT  ANTIBIOTICS:   Anti-infectives    Start     Dose/Rate Route Frequency Ordered Stop   05/29/17 1000  ceFEPIme (MAXIPIME) 1 g in dextrose 5 % 50 mL IVPB     1 g 100 mL/hr over 30 Minutes Intravenous Every 24 hours 05/29/17 0832     05/28/17 1200  cefTRIAXone  (ROCEPHIN) 2 g in dextrose 5 % 50 mL IVPB  Status:  Discontinued     2 g 100 mL/hr over 30 Minutes Intravenous Every 24 hours 05/28/17 0737 05/29/17 0832   05/26/17 1200  piperacillin-tazobactam (ZOSYN) IVPB 3.375 g  Status:  Discontinued     3.375 g 12.5 mL/hr over 240 Minutes Intravenous Every 8 hours 05/26/17 1042 05/28/17 0737   05/25/17 1000  piperacillin-tazobactam (ZOSYN) IVPB 2.25 g  Status:  Discontinued     2.25 g 100 mL/hr over 30 Minutes Intravenous Every 8 hours 05/25/17 0745 05/26/17 1042   05/24/17 1800  linezolid (ZYVOX) IVPB 600 mg  Status:  Discontinued     600 mg 300 mL/hr over 60 Minutes Intravenous Every 12 hours 05/24/17 1709 05/27/17 0817   05/24/17 1800  piperacillin-tazobactam (ZOSYN) IVPB 3.375 g  Status:  Discontinued     3.375 g 12.5 mL/hr over 240 Minutes Intravenous Every 8 hours 05/24/17 1713 05/25/17 0745   05/24/17 1715  piperacillin-tazobactam (ZOSYN) IVPB 3.375 g  Status:  Discontinued  3.375 g 100 mL/hr over 30 Minutes Intravenous Every 8 hours 05/24/17 1709 05/24/17 1712   05/20/17 2200  cefUROXime (ZINACEF) 1.5 g in dextrose 5 % 50 mL IVPB     1.5 g 100 mL/hr over 30 Minutes Intravenous Every 12 hours 05/20/17 1756 05/21/17 1034   05/20/17 0850  dextrose 5 % with cefUROXime (ZINACEF) ADS Med    Comments:  Ardine Eng   : cabinet override      05/20/17 0850 05/20/17 1033   05/20/17 0837  cefUROXime (ZINACEF) 1.5 g in dextrose 5 % 50 mL IVPB     1.5 g 100 mL/hr over 30 Minutes Intravenous 30 min pre-op 05/20/17 0837 05/20/17 [place], PA-C [facility] Office: [phone] Pager: [phone] 06/03/2017 8:07 AM  I have interviewed the patient and examined the patient. I agree with the findings by the PA. Speech Pathology recommends Dysphagia 1 (puree) diet.  PTx recommends SNF. On cefepime for enterobacter/klebsiella pneumonia.  Hypokalemia: supplement Anemia: Hgb = 7.9 today. Follow.    Gae Gallop, MD [phone]

## 2017-06-03 NOTE — Progress Notes (Signed)
Pt received from [place]. Pt alert to self and location. Pt states it is 1976, but knows that Daisy Floro is president.Family updated as to plan of care. VSS. Telemetry applied, CCMD notified x2. IVF changed per MD order - see eMAR.  Fritz Pickerel, RN

## 2017-06-03 NOTE — Progress Notes (Signed)
SLP Cancellation Note  Patient Details Name: Brooke Keller MRN: [id number] DOB: June 10, 1935   Cancelled treatment:        Attempted to see earlier today with po's to further determine safety however pt just getting on bed pan. Will continue efforts.   [name] 06/03/2017, 3:41 PM    Orbie Pyo Colvin Caroli.Ed [employer] [phone]

## 2017-06-03 NOTE — Clinical Social Work Note (Signed)
Clinical Social Work Assessment  Patient Details  Name: Brooke Keller MRN: [id number] Date of Birth: Dec 27, 1934  Date of referral:  06/03/17               Reason for consult:  Facility Placement                Permission sought to share information with:  Chartered certified accountant granted to share information::  Yes, Verbal Permission Granted  Name::     Engineer, civil (consulting)::  SNF  Relationship::  spouse  Contact Information:     Housing/Transportation Living arrangements for the past 2 months:  Single Family Home Source of Information:  Spouse Patient Interpreter Needed:  None Criminal Activity/Legal Involvement Pertinent to Current Situation/Hospitalization:  No - Comment as needed Significant Relationships:  Adult Children, Spouse Lives with:  Spouse Do you feel safe going back to the place where you live?  No Need for family participation in patient care:  Yes (Comment) (spouse assists with ADLs at home)  Care giving concerns:  Pt lives at home with spouse- has 2 adult dtrs who live nearby one of which works part time and is available to help at home.  Pt required help at home prior to admission but would do ADLs with assistance from spouse/devices.  Pt now with significant increase in impairment and spouse unsure if her needs can be managed at home.   Social Worker assessment / plan:  CSW spoke with pt and pt spouse at bedside concerning PT recommendation for SNF.  Pt very lethargic and did not wake up fully despite questioning.  CSW explained PT recommendations to spouse and referral process for SNF.  Pt spouse states he has been to SNF in the past and that their dtr has worked at several of the facilities.  Employment status:  Retired Nurse, adult PT Recommendations:  [place] / Referral to community resources:  [place]  Patient/Family's Response to care:  Pt spouse is agreeable to CSW  looking into SNF options but wants to consult with dtr before making any final decisions.  Patient/Family's Understanding of and Emotional Response to Diagnosis, Current Treatment, and Prognosis:  Pt spouse realistic about pt needs at this time and hopeful that she will improve enough during hospital stay to return home safely.  Emotional Assessment Appearance:  Appears stated age Attitude/Demeanor/Rapport:  Lethargic Affect (typically observed):  Unable to Assess Orientation:  Oriented to Self, Oriented to Place, Oriented to  Time Alcohol / Substance use:  Not Applicable Psych involvement (Current and /or in the community):  No (Comment)  Discharge Needs  Concerns to be addressed:  Care Coordination Readmission within the last 30 days:  No Current discharge risk:  Physical Impairment Barriers to Discharge:  Continued Medical Work up   Jorge Ny, LCSW 06/03/2017, 10:57 AM

## 2017-06-03 NOTE — Progress Notes (Signed)
[facility] Progress Note Patient Name: Brooke Keller DOB: March 21, 1935 MRN: [id number]   Date of Service  06/03/2017  HPI/Events of Note  K+ = 3.0 and Creatinine = 0.89.   eICU Interventions  Will replace K+.      Intervention Category Major Interventions: Electrolyte abnormality - evaluation and management  Welles [place]all [place] 06/03/2017, 5:29 AM

## 2017-06-03 NOTE — NC FL2 (Signed)
[place] LEVEL OF CARE SCREENING TOOL     IDENTIFICATION  Patient Name: Brooke Keller Birthdate: [birth year] Sex: female Admission Date (Current Location): 05/20/2017  [facility] and [place] Number:  [employer] and Address:  The . [facility], [place] [address], [place], [place] [address]      Provider Number: [id number]  Attending Physician Name and Address:  Allie Bossier, MD  Relative Name and Phone Number:       Current Level of Care: Hospital Recommended Level of Care: [place] Prior Approval Number:    Date Approved/Denied:   PASRR Number: [id number]  Discharge Plan: SNF    Current Diagnoses: Patient Active Problem List   Diagnosis Date Noted  . Dyspnea   . Aortoiliac occlusive disease ([place]) 05/20/2017  . PAD (peripheral artery disease) ([place]) 05/06/2017  . Pre-op testing 05/06/2017  . COPD (chronic obstructive pulmonary disease) ([place]) 09/17/2016  . Acute on chronic respiratory failure ([place]) 09/17/2016  . COPD with acute exacerbation ([place]) 09/17/2016  . Elevated troponin 09/17/2016  . IgG multiple myeloma ([place]) 04/14/2016  . Pain 01/25/2016  . Cancer associated pain 02/09/2015  . Port catheter in place 09/20/2013    Orientation RESPIRATION BLADDER Height & Weight     Self, Time, Place  O2 (see DC summary) Incontinent, Indwelling catheter Weight: 132 lb 4.4 oz (60 kg) Height:  _0  (139.7 cm)  BEHAVIORAL SYMPTOMS/MOOD NEUROLOGICAL BOWEL NUTRITION STATUS      Incontinent Diet (see DC summary)  AMBULATORY STATUS COMMUNICATION OF NEEDS Skin   Extensive Assist Verbally Surgical wounds                       Personal Care Assistance Level of Assistance  Bathing, Dressing Bathing Assistance: Maximum assistance   Dressing Assistance: Maximum assistance     Functional Limitations Info             SPECIAL CARE FACTORS FREQUENCY  PT (By licensed PT), OT (By licensed OT), Speech therapy      PT Frequency: 5/wk OT Frequency: 5/wk     Speech Therapy Frequency: 2/wk      Contractures      Additional Factors Info  Code Status, Allergies, Insulin Sliding Scale, Isolation Precautions Code Status Info: FULL Allergies Info: Ativan Lorazepam, Amitriptyline   Insulin Sliding Scale Info: 6/day Isolation Precautions Info: MRSA     Current Medications (06/03/2017):  This is the current hospital active medication list Current Facility-Administered Medications  Medication Dose Route Frequency Provider Last Rate Last Dose  . 0.45 % sodium chloride infusion   Intravenous Continuous Javier Glazier, MD 10 mL/hr at 06/03/17 0600    . acetaminophen (TYLENOL) solution 325-650 mg  325-650 mg Oral Q4H PRN Javier Glazier, MD       Or  . acetaminophen (TYLENOL) suppository 325-650 mg  325-650 mg Rectal Q4H PRN Javier Glazier, MD      . amiodarone (PACERONE) tablet 400 mg  400 mg Oral Daily Javier Glazier, MD   400 mg at 06/03/17 2025  . aspirin chewable tablet 162 mg  162 mg Per Tube Daily Javier Glazier, MD   162 mg at 06/03/17 4270  . brimonidine (ALPHAGAN) 0.2 % ophthalmic solution 1 drop  1 drop Both Eyes TID Virgina Jock A, PA-C   1 drop at 06/03/17 0925  . ceFEPIme (MAXIPIME) 2 g in dextrose 5 % 50 mL IVPB  2 g Intravenous  Q24H Einar Grad, [place]      . chlorhexidine (PERIDEX) 0.12 % solution 15 mL  15 mL Mouth Rinse BID Angelia Mould, MD   15 mL at 06/03/17 9758  . Chlorhexidine Gluconate Cloth 2 % PADS 6 each  6 each Topical Daily Angelia Mould, MD   6 each at 06/03/17 0935  . collagenase (SANTYL) ointment   Topical BID Angelia Mould, MD      . dorzolamide-timolol (COSOPT) 22.3-6.8 MG/ML ophthalmic solution 1 drop  1 drop Both Eyes BID Virgina Jock A, PA-C   1 drop at 06/03/17 0925  . fentaNYL (SUBLIMAZE) injection 25 mcg  25 mcg Intravenous Q2H PRN Javier Glazier, MD   25 mcg at 06/03/17 0351  . fluticasone (FLONASE) 50  MCG/ACT nasal spray 2 spray  2 spray Each Nare Daily PRN Virgina Jock A, PA-C      . heparin injection 5,000 Units  5,000 Units Subcutaneous Q8H Javier Glazier, MD   5,000 Units at 06/03/17 0529  . hydrALAZINE (APRESOLINE) injection 5 mg  5 mg Intravenous Q20 Min PRN Virgina Jock A, PA-C      . hydrocortisone sodium succinate (SOLU-CORTEF) 100 MG injection 50 mg  50 mg Intravenous Q12H Javier Glazier, MD   50 mg at 06/03/17 0340  . insulin aspart (novoLOG) injection 0-15 Units  0-15 Units Subcutaneous Q4H Javier Glazier, MD   2 Units at 06/03/17 [phone]  . ipratropium-albuterol (DUONEB) 0.5-2.5 (3) MG/3ML nebulizer solution 3 mL  3 mL Nebulization TID Angelia Mould, MD   3 mL at 06/03/17 0751  . labetalol (NORMODYNE,TRANDATE) injection 10 mg  10 mg Intravenous Q10 min PRN Virgina Jock A, PA-C      . latanoprost (XALATAN) 0.005 % ophthalmic solution 1 drop  1 drop Both Eyes QHS Virgina Jock A, PA-C   1 drop at 06/02/17 2131  . magnesium sulfate IVPB 2 g 50 mL  2 g Intravenous Daily PRN Virgina Jock A, PA-C      . MEDLINE mouth rinse  15 mL Mouth Rinse q12n4p Angelia Mould, MD   15 mL at 06/02/17 1505  . metoprolol tartrate (LOPRESSOR) injection 2.5 mg  2.5 mg Intravenous Q6H PRN Javier Glazier, MD   2.5 mg at 05/30/17 1714  . metoprolol tartrate (LOPRESSOR) injection 2.5 mg  2.5 mg Intravenous Q6H Javier Glazier, MD   2.5 mg at 06/02/17 0517  . ondansetron (ZOFRAN) injection 4 mg  4 mg Intravenous Q6H PRN Virgina Jock A, PA-C   4 mg at 05/23/17 0353  . pantoprazole (PROTONIX) injection 40 mg  40 mg Intravenous QHS Parrett, Tammy S, NP   40 mg at 06/02/17 2133  . [place]   Oral PRN Angelia Mould, MD      . silver sulfADIAZINE (SILVADENE) 1 % cream 1 application  1 application Topical Daily Virgina Jock A, PA-C   1 application at 49/82/64 [phone]  . sodium chloride flush (NS) 0.9 % injection 10-40 mL  10-40 mL Intracatheter  Q12H Angelia Mould, MD   10 mL at 06/03/17 0924  . sodium chloride flush (NS) 0.9 % injection 10-40 mL  10-40 mL Intracatheter PRN Angelia Mould, MD         Discharge Medications: Please see discharge summary for a list of discharge medications.  Relevant Imaging Results:  Relevant Lab Results:   Additional Information SS#: [id number]  Jorge Ny, LCSW

## 2017-06-03 NOTE — Progress Notes (Signed)
Patient has BiPAP from home and was placed on for the night by RN. RT assessed patient and patient is doing well at this time. RT will continue to monitor.

## 2017-06-03 NOTE — Progress Notes (Signed)
PROGRESS NOTE    Brooke Keller  OIT:[id number] DOB: 1935/04/18 DOA: 05/20/2017 PCP: Curlene Labrum, MD   Brief Narrative:  82 year old WF PMHx Diabetes type 2 uncontrolled with complication, COPD on home 3 L O2 , CHF, HTN and IgG multiple myeloma   Patient underwent bypass surgery for non-healing ulcer of the ankle. Patient developed subsequent abdominal distention and ileus but refused NG tube placement. Patient became more altered and hypoxic overnight with nausea and vomiting. Patient had subsequent probable aspiration. Patient was noted to be in atrial fibrillation with rapid ventricular response and hypertensive. Metoprolol and Lasix were administered. Patient was transferred to the ICU and placed on noninvasive positive pressure ventilation. Patient's respiratory status remained marginal at best and ultimately she was endotracheally intubated.      Subjective: 8/23 sleepy but arousable. A/O 4, negative CP, negative SOB, negative N/V, negative abdominal pain. States that in chair for~5 hours. Per family overnight BP drop to 70/50, requiring fluid resuscitation     Assessment & Plan:   Active Problems:   Aortoiliac occlusive disease (HCC)   Dyspnea  Acute respiratory failure with hypoxia/positive Klebsiella & Enterobacter Pneumonia/Aspiration Pneumona -Multifactorial pneumonia, pleural effusion, COPD, CHF? -Now on 2 L O2 via [facility] (home regimen was 3 L O2) -PCXR from 8/20 shows continued moderate pleural effusionRt>>Lt : 8/23 PCXR, slight improvement see results below. May still require Thoracentesis. -DuoNeb TID -Flutter valve -Complete seven-day course antibiotics -Titrate O2 to maintain SPO2 89 - 92%  Cardiogenic Shock/Chronic diastolic CHF/Pulmonary HTN -Likely secondary to sepsis, adrenal insufficiency, CHF, iatrogenic -Echocardiogram: CHF and pulmonary hypertension see results below -Patient has been experiencing episodes of hypotension most likely secondary to  adrenal insufficiency -Goal MAP> 65 & SBP> 90  Hypotension  -See iatrogenic adrenal insufficiency -If severe bradycardia resolves with decrease in Amiodarone start Midodrin  PVD -S/P bypass  New onset Atrial fibrillation -Currently in NSR -8/23 decrease Amiodarone 200 mg daily -8/23 Sinus bradycardia Metoprolol IV discontinued -strict I&O since admission +9.3 L -Daily weight Filed Weights   06/02/17 0342 06/04/17 0404 06/05/17 0521  Weight: 132 lb 4.4 oz (60 kg) 142 lb 8 oz (64.6 kg) 141 lb 4.8 oz (64.1 kg)  -Transfuse for hemoglobin<8  Acute on CKD stage III (baseline Cr 1.04-1.30)   Recent Labs Lab 05/30/17 1754 05/31/17 0319 05/31/17 1802 06/01/17 0300 06/02/17 0400 06/03/17 0316  CREATININE 1.78* 1.62* 1.52* 1.53*  1.51* 1.16* 0.89   Hypernatremia -D5W 30 ml/hr  Right non-healing ulcer over Right lateral malleolus  -S/P surgical repair on 8/7 by vascular surgery, see significant events -Continue care per surgery   Ileus: Resolved.    Leukocytosis:  -Improved  Resolved.  Pancytopenia:  -Multifactorial sepsis and Zyvox -No history IgG multiple myeloma: Multiple myeloma treatment on hold -PTA on chronic prednisone, on hold  Iatrogenic Adrenal Insufficiency -Patient not doing well with tapering of Hydrocortisone (multiple episodes hypotension). -Increase Hydrocortisone 50 mg IV TID  Diabetes Mellitus Type 2 controlled with renal complication:  -5/83 Hemoglobin A1c= 5.9  Delirium -Multifactorial postop pain, chronic benzodiazepine, chronic steroids, hypernatremia -Resolved -Minimize sedating medication   Hypokalemia -Potassium goal> 4 -Potassium IV 50 mEq    Hypomagnesemia -Magnesium goal> 2 -Magnesium IV 1 g     DVT prophylaxis: Subcutaneous heparin Code Status: Full Family Communication: Family at bedside for discussion of plan of care Disposition Plan: TBD   Consultants:  [facility] Vascular surgery     Procedures/Significant  Events:  08/07 -Left common femoral artery exposure/Left femorofemoral  anastomosis as part of Right axillobifemoral bypass 08/11 - Transferred to ICU w/ respiratory failure & atrial fibrillation >> failed BiPAP & was intubated CT CHEST W/O 8/11: - Dense consolidation of the right lower lobe/ patchy airspace opacities within the remainder of the right lung and left lower lobe, compatible with multifocal pneumonia. -An incidental finding of potential clinical significance has been found. Aneurysmal dilatation of the infrarenal abdominal aorta to 3.3 cm in AP dimension.  -1.2 cm nonspecific hypodensity at the right hepatic lobe. - Chronic compression deformities at T4, T5, T6, T7, T10 and L1. PORT ABD X-RAY 8/12:  Decreased small bowel dilatation since previous study. PORT CXR 8/12:  Right lower lobe opacity with chronic elevation of right hemidiaphragm. Somewhat lordotic and rotated film.  08/12 - Tolerated 8 hours of PS 12/5 weaning  08/14 - Bowel movements restarted PORT CXR 8/14:  -. Persistent patchy bilateral opacities, right predominant, relatively unchanged when compared with x-ray imaging from 8/12. -No new pleural effusion.  PORT CXR 8/15:  - slight worsening in bilateral pleural effusions versus atelectasis. 08/16 - Hypoglycemic to 64 overnight. 1amp of Bicarb given for acidosis. Increased WOB>>BiPAP. Normal Sinus Rhythm. PORT CXR 8/20:  Personally reviewed by me. Bilateral lower lung opacities & silhouetting of hemidiaphragm. Port and CVC unchanged.  -8/23 Echocardiogram:Left ventricle:  mild LVH. -LVEF = 65% to 70%.  -Grade  2 diastolic dysfunction - Left atrium: Mild to moderately dilated. -- Pulmonary arteries: PA peak pressure: 62 mm Hg (S). 8/23 PCXR: Bilateral pleural effusions, pulmonary venous congestion and bilateral interstitial prominence slightly improved. Pneumonia cannot be excluded           I have personally reviewed and interpreted all radiology studies and my  findings are as above.  VENTILATOR SETTINGS:    CulturesMRSA PCR 7/25:  Positive  Blood Cultures x2 8/12:  Negative  Urine Culture 8/12:  Negative  Tracheal Aspirate Culture 8/12:   positive Moderate Klebsiella pneumoniae & Enterobacter cloacae     Antimicrobials:   Anti-infectives    Start     Stop   06/03/17 1000  ceFEPIme (MAXIPIME) 2 g in dextrose 5 % 50 mL IVPB  Status:  Discontinued     06/04/17 1341   05/29/17 1000  ceFEPIme (MAXIPIME) 1 g in dextrose 5 % 50 mL IVPB  Status:  Discontinued     06/03/17 0835   05/28/17 1200  cefTRIAXone (ROCEPHIN) 2 g in dextrose 5 % 50 mL IVPB  Status:  Discontinued     05/29/17 0832   05/26/17 1200  piperacillin-tazobactam (ZOSYN) IVPB 3.375 g  Status:  Discontinued     05/28/17 0737   05/25/17 1000  piperacillin-tazobactam (ZOSYN) IVPB 2.25 g  Status:  Discontinued     05/26/17 1042   05/24/17 1800  linezolid (ZYVOX) IVPB 600 mg  Status:  Discontinued     05/27/17 0817   05/24/17 1800  piperacillin-tazobactam (ZOSYN) IVPB 3.375 g  Status:  Discontinued     05/25/17 0745   05/24/17 1715  piperacillin-tazobactam (ZOSYN) IVPB 3.375 g  Status:  Discontinued     05/24/17 1712   05/20/17 2200  cefUROXime (ZINACEF) 1.5 g in dextrose 5 % 50 mL IVPB     05/21/17 1034   05/20/17 0850  dextrose 5 % with cefUROXime (ZINACEF) ADS Med    Comments:  Ardine Eng   : cabinet override   05/20/17 1033   05/20/17 0837  cefUROXime (ZINACEF) 1.5 g in dextrose 5 % 50 mL IVPB  05/20/17 1043        Devices    LINES / TUBES:      Continuous Infusions: . sodium chloride 10 mL/hr at 06/03/17 0600  . ceFEPime (MAXIPIME) IV Stopped (06/02/17 1008)  . magnesium sulfate 1 - 4 g bolus IVPB    . potassium chloride 10 mEq (06/03/17 0646)     Objective: Vitals:   06/03/17 0400 06/03/17 0500 06/03/17 0600 06/03/17 0752  BP: (!) 133/56 (!) 144/45 (!) 151/53   Pulse: (!) 51 (!) 47 (!) 55   Resp: 16 15 19    Temp: 98.5 F (36.9 C)       TempSrc: Axillary     SpO2: 98% 99% 98% 98%  Weight:      Height:        Intake/Output Summary (Last 24 hours) at 06/03/17 0825 Last data filed at 06/03/17 0646  Gross per 24 hour  Intake              830 ml  Output              931 ml  Net             -101 ml   Filed Weights   05/27/17 0430 05/30/17 0300 06/02/17 0342  Weight: 148 lb 5.9 oz (67.3 kg) 145 lb 11.6 oz (66.1 kg) 132 lb 4.4 oz (60 kg)    Examination:  General: Sleepy but arousable, A/O 4, positive chronic respiratory distress Neck:  Negative scars, masses, torticollis, lymphadenopathy, JVD Lungs: absent breath sounds bilateral Rt >> Lt , negative wheezing  Cardiovascular: Regular rate and rhythm without murmur gallop or rub normal S1 and S2 Abdomen: negative abdominal pain, nondistended, positive soft, bowel sounds, no rebound, no ascites, no appreciable mass Extremities: No significant cyanosis, clubbing. Bilateral edema 1+, bilateral feet wrapped in Ace bandages did not take down wrappings. Psychiatric:  Unable to fully evaluate  Central nervous system:  Cranial nerves II through XII intact, tongue/uvula midline, all extremities muscle strength 5/5, sensation intact throughout, negative dysarthria, negative expressive aphasia, negative receptive aphasia.  .     Data Reviewed: Care during the described time interval was provided by me .  I have reviewed this patient's available data, including medical history, events of note, physical examination, and all test results as part of my evaluation.   CBC:  Recent Labs Lab 05/30/17 0332 05/31/17 0319 06/01/17 0300 06/02/17 0400 06/03/17 0316  WBC 9.6 8.0 6.6 5.4 4.6  NEUTROABS 8.7* 7.1 5.3 4.0 3.5  HGB 9.6* 9.5* 9.3* 9.0* 7.9*  HCT 30.7* 29.8* 29.1* 29.8* 26.6*  MCV 91.1 90.9 91.2 91.7 93.7  PLT 123* 121* 138* 154 779*   Basic Metabolic Panel:  Recent Labs Lab 05/30/17 0332  05/31/17 0319 05/31/17 1802 06/01/17 0300 06/02/17 0400 06/03/17 0316   NA 141  < > 144 144 147*  147* 151* 148*  K 3.5  < > 3.4* 3.3* 3.4*  3.4* 3.0* 3.0*  CL 107  < > 110 107 109  110 112* 111  CO2 20*  < > 21* 23 25  24 30 31   GLUCOSE 112*  < > 130* 122* 137*  132* 107* 112*  BUN 44*  < > 41* 38* 34*  35* 24* 18  CREATININE 1.83*  < > 1.62* 1.52* 1.53*  1.51* 1.16* 0.89  CALCIUM 7.0*  < > 7.4* 7.5* 7.7*  7.7* 8.0* 7.9*  MG 2.1  --  2.2  --  1.9 1.9 1.7  PHOS 4.7*  --  3.7  --  2.8  2.9 2.1* 2.5  < > = values in this interval not displayed. GFR: Estimated Creatinine Clearance: 35.3 mL/min (by C-G formula based on SCr of 0.89 mg/dL). Liver Function Tests:  Recent Labs Lab 05/30/17 0332 05/31/17 0319 06/01/17 0300 06/02/17 0400 06/03/17 0316  ALBUMIN 2.2* 2.2* 2.5* 2.4* 2.2*   No results for input(s): LIPASE, AMYLASE in the last 168 hours. No results for input(s): AMMONIA in the last 168 hours. Coagulation Profile: No results for input(s): INR, PROTIME in the last 168 hours. Cardiac Enzymes: No results for input(s): CKTOTAL, CKMB, CKMBINDEX, TROPONINI in the last 168 hours. BNP (last 3 results) No results for input(s): PROBNP in the last 8760 hours. HbA1C: No results for input(s): HGBA1C in the last 72 hours. CBG:  Recent Labs Lab 06/02/17 1149 06/02/17 2028 06/02/17 2319 06/03/17 0320 06/03/17 0737  GLUCAP 159* 146* 135* 100* 149*   Lipid Profile: No results for input(s): CHOL, HDL, LDLCALC, TRIG, CHOLHDL, LDLDIRECT in the last 72 hours. Thyroid Function Tests: No results for input(s): TSH, T4TOTAL, FREET4, T3FREE, THYROIDAB in the last 72 hours. Anemia Panel: No results for input(s): VITAMINB12, FOLATE, FERRITIN, TIBC, IRON, RETICCTPCT in the last 72 hours. Urine analysis:    Component Value Date/Time   COLORURINE YELLOW 05/25/2017 1510   APPEARANCEUR CLEAR 05/25/2017 1510   LABSPEC 1.013 05/25/2017 1510   PHURINE 5.0 05/25/2017 1510   GLUCOSEU 50 (A) 05/25/2017 1510   HGBUR MODERATE (A) 05/25/2017 1510    BILIRUBINUR NEGATIVE 05/25/2017 1510   KETONESUR NEGATIVE 05/25/2017 1510   PROTEINUR 30 (A) 05/25/2017 1510   UROBILINOGEN 1.0 10/01/2011 0820   NITRITE NEGATIVE 05/25/2017 1510   LEUKOCYTESUR NEGATIVE 05/25/2017 1510   Sepsis Labs: @LABRCNTIP (procalcitonin:4,lacticidven:4)  ) Recent Results (from the past 240 hour(s))  Culture, Urine     Status: None   Collection Time: 05/25/17  3:10 PM  Result Value Ref Range Status   Specimen Description URINE, CATHETERIZED  Final   Special Requests Normal  Final   Culture NO GROWTH  Final   Report Status 05/26/2017 FINAL  Final  Culture, blood (routine x 2)     Status: None   Collection Time: 05/25/17  3:20 PM  Result Value Ref Range Status   Specimen Description BLOOD RIGHT ANTECUBITAL  Final   Special Requests   Final    BOTTLES DRAWN AEROBIC AND ANAEROBIC Blood Culture adequate volume   Culture NO GROWTH 5 DAYS  Final   Report Status 05/30/2017 FINAL  Final  Culture, blood (routine x 2)     Status: None   Collection Time: 05/25/17  3:30 PM  Result Value Ref Range Status   Specimen Description BLOOD RIGHT WRIST  Final   Special Requests   Final    BOTTLES DRAWN AEROBIC ONLY Blood Culture results may not be optimal due to an inadequate volume of blood received in culture bottles   Culture NO GROWTH 5 DAYS  Final   Report Status 05/30/2017 FINAL  Final  Culture, respiratory (NON-Expectorated)     Status: None   Collection Time: 05/25/17  3:50 PM  Result Value Ref Range Status   Specimen Description TRACHEAL ASPIRATE  Final   Special Requests Normal  Final   Gram Stain   Final    RARE WBC PRESENT,BOTH PMN AND MONONUCLEAR RARE GRAM NEGATIVE RODS RARE GRAM POSITIVE RODS    Culture   Final    MODERATE KLEBSIELLA PNEUMONIAE MODERATE ENTEROBACTER CLOACAE  Report Status 05/28/2017 FINAL  Final   Organism ID, Bacteria KLEBSIELLA PNEUMONIAE  Final   Organism ID, Bacteria ENTEROBACTER CLOACAE  Final      Susceptibility   Enterobacter  cloacae - MIC*    CEFAZOLIN >=64 RESISTANT Resistant     CEFEPIME <=1 SENSITIVE Sensitive     CEFTAZIDIME 4 SENSITIVE Sensitive     CEFTRIAXONE 2 SENSITIVE Sensitive     CIPROFLOXACIN <=0.25 SENSITIVE Sensitive     GENTAMICIN <=1 SENSITIVE Sensitive     IMIPENEM 1 SENSITIVE Sensitive     TRIMETH/SULFA <=20 SENSITIVE Sensitive     PIP/TAZO >=128 RESISTANT Resistant     * MODERATE ENTEROBACTER CLOACAE   Klebsiella pneumoniae - MIC*    AMPICILLIN >=32 RESISTANT Resistant     CEFAZOLIN <=4 SENSITIVE Sensitive     CEFEPIME <=1 SENSITIVE Sensitive     CEFTAZIDIME <=1 SENSITIVE Sensitive     CEFTRIAXONE <=1 SENSITIVE Sensitive     CIPROFLOXACIN <=0.25 SENSITIVE Sensitive     GENTAMICIN <=1 SENSITIVE Sensitive     IMIPENEM <=0.25 SENSITIVE Sensitive     TRIMETH/SULFA <=20 SENSITIVE Sensitive     AMPICILLIN/SULBACTAM 4 SENSITIVE Sensitive     PIP/TAZO <=4 SENSITIVE Sensitive     Extended ESBL NEGATIVE Sensitive     * MODERATE KLEBSIELLA PNEUMONIAE         Radiology Studies: Dg Chest Port 1 View  Result Date: 06/02/2017 CLINICAL DATA:  Respiratory failure. EXAM: PORTABLE CHEST 1 VIEW COMPARISON:  Single-view of the chest 06/01/2017 and 05/31/2017. FINDINGS: Right IJ approach Port-A-Cath and left subclavian central venous catheter remain in place. Bilateral pleural effusions and basilar airspace disease, worse on the right, are unchanged. Heart size is upper normal. No pneumothorax. IMPRESSION: No change in small to moderate bilateral pleural effusions and basilar airspace disease, worse on the right. Electronically Signed   By: Inge Rise M.D.   On: 06/02/2017 07:42        Scheduled Meds: . amiodarone  400 mg Oral Daily  . aspirin  162 mg Per Tube Daily  . brimonidine  1 drop Both Eyes TID  . chlorhexidine  15 mL Mouth Rinse BID  . Chlorhexidine Gluconate Cloth  6 each Topical Daily  . collagenase   Topical BID  . dorzolamide-timolol  1 drop Both Eyes BID  . heparin  subcutaneous  5,000 Units Subcutaneous Q8H  . hydrocortisone sodium succinate  50 mg Intravenous Q12H  . insulin aspart  0-15 Units Subcutaneous Q4H  . ipratropium-albuterol  3 mL Nebulization TID  . latanoprost  1 drop Both Eyes QHS  . mouth rinse  15 mL Mouth Rinse q12n4p  . metoprolol tartrate  2.5 mg Intravenous Q6H  . pantoprazole (PROTONIX) IV  40 mg Intravenous QHS  . silver sulfADIAZINE  1 application Topical Daily  . sodium chloride flush  10-40 mL Intracatheter Q12H   Continuous Infusions: . sodium chloride 10 mL/hr at 06/03/17 0600  . ceFEPime (MAXIPIME) IV Stopped (06/02/17 1008)  . magnesium sulfate 1 - 4 g bolus IVPB    . potassium chloride 10 mEq (06/03/17 0646)     LOS: 14 days    Time spent: 40 minutes    Geana Walts, Geraldo Docker, MD [facility] Pager [phone]   If 7PM-7AM, please contact night-coverage [email or web address] Password [email or web address] 06/03/2017, 8:25 AM

## 2017-06-03 NOTE — Progress Notes (Signed)
Nutrition Follow Up  DOCUMENTATION CODES:   Obesity unspecified  INTERVENTION:    Magic cup TID with meals, each supplement provides 290 kcal and 9 grams of protein   If PO intake does not improve, rec Cortrak tube placement for supplemental nutrition support  NUTRITION DIAGNOSIS:   Inadequate oral intake now related to dysphagia as evidenced by meal completion < 50%, ongoing  GOAL:   Patient will meet greater than or equal to 90% of their needs, progressing  MONITOR:   PO intake, Supplement acceptance, Labs, Weight trends, Skin, I & O's  ASSESSMENT:   82 yo female admitted with right non-healing ulcer over right lateral malleolus. Developed acute respiratory failure, sepsis, ARF. Pt with hx of IgG multiple myeloma, COPD, PAD, DM, CHF  8/7 Admit for ax-bifem bypass 8/11 Transfer to ICU with respiratory failure,  failed BiPaP, required intubation  Pt extubated 8/15. On and off BiPAP. S/p bedside swallow evaluation 8/19. Advanced to Dys 1-honey liquids 8/20. Receiving nursing care upon visit today, however, PO intake 25-50% per flowsheets. Labs and medications reviewed. Na 148 (H). K 3.0 (L). CBG's 135-100-149.  Diet Order:  DIET - DYS 1 Room service appropriate? Yes; Fluid consistency: Honey Thick; Fluid restriction: 1200 mL Fluid  Skin:  Reviewed, no issues (surgical incisions)  Last BM:  8/20  Height:   Ht Readings from Last 1 Encounters:  05/30/17 _0  (1.397 m)   Weight:   Wt Readings from Last 1 Encounters:  06/02/17 132 lb 4.4 oz (60 kg)   Ideal Body Weight:  42 kg  BMI:  Body mass index is 30.74 kg/m.  Estimated Nutritional Needs:   Kcal:  1400-1600  Protein:  75-90 gm  Fluid:  per MD  EDUCATION NEEDS:   No education needs identified at this time  Arthur Holms, RD, LDN Pager #: [phone] After-Hours Pager #: [phone]

## 2017-06-04 ENCOUNTER — Other Ambulatory Visit (HOSPITAL_COMMUNITY): Payer: Self-pay

## 2017-06-04 DIAGNOSIS — L89302 Pressure ulcer of unspecified buttock, stage 2: Secondary | ICD-10-CM

## 2017-06-04 DIAGNOSIS — J90 Pleural effusion, not elsewhere classified: Secondary | ICD-10-CM

## 2017-06-04 DIAGNOSIS — J189 Pneumonia, unspecified organism: Secondary | ICD-10-CM

## 2017-06-04 DIAGNOSIS — C9000 Multiple myeloma not having achieved remission: Secondary | ICD-10-CM

## 2017-06-04 DIAGNOSIS — L8990 Pressure ulcer of unspecified site, unspecified stage: Secondary | ICD-10-CM | POA: Insufficient documentation

## 2017-06-04 LAB — RENAL FUNCTION PANEL
ANION GAP: 6 (ref 5–15)
Albumin: 2.2 g/dL — ABNORMAL LOW (ref 3.5–5.0)
BUN: 15 mg/dL (ref 6–20)
CHLORIDE: 112 mmol/L — AB (ref 101–111)
CO2: 29 mmol/L (ref 22–32)
Calcium: 7.8 mg/dL — ABNORMAL LOW (ref 8.9–10.3)
Creatinine, Ser: 0.83 mg/dL (ref 0.44–1.00)
Glucose, Bld: 146 mg/dL — ABNORMAL HIGH (ref 65–99)
POTASSIUM: 2.8 mmol/L — AB (ref 3.5–5.1)
Phosphorus: 1.7 mg/dL — ABNORMAL LOW (ref 2.5–4.6)
Sodium: 147 mmol/L — ABNORMAL HIGH (ref 135–145)

## 2017-06-04 LAB — GLUCOSE, CAPILLARY
GLUCOSE-CAPILLARY: 108 mg/dL — AB (ref 65–99)
GLUCOSE-CAPILLARY: 136 mg/dL — AB (ref 65–99)
GLUCOSE-CAPILLARY: 148 mg/dL — AB (ref 65–99)
Glucose-Capillary: 163 mg/dL — ABNORMAL HIGH (ref 65–99)
Glucose-Capillary: 158 mg/dL — ABNORMAL HIGH (ref 65–99)
Glucose-Capillary: 111 mg/dL — ABNORMAL HIGH (ref 65–99)

## 2017-06-04 LAB — CBC WITH DIFFERENTIAL/PLATELET
BASOS ABS: 0 10*3/uL (ref 0.0–0.1)
Basophils Relative: 0 %
Eosinophils Absolute: 0 10*3/uL (ref 0.0–0.7)
Eosinophils Relative: 1 %
HEMATOCRIT: 26.2 % — AB (ref 36.0–46.0)
Hemoglobin: 7.9 g/dL — ABNORMAL LOW (ref 12.0–15.0)
LYMPHS ABS: 0.4 10*3/uL — AB (ref 0.7–4.0)
LYMPHS PCT: 11 %
MCH: 28.2 pg (ref 26.0–34.0)
MCHC: 30.2 g/dL (ref 30.0–36.0)
MCV: 93.6 fL (ref 78.0–100.0)
Monocytes Absolute: 0.4 10*3/uL (ref 0.1–1.0)
Monocytes Relative: 10 %
NEUTROS ABS: 3 10*3/uL (ref 1.7–7.7)
Neutrophils Relative %: 78 %
Platelets: 136 10*3/uL — ABNORMAL LOW (ref 150–400)
RBC: 2.8 MIL/uL — AB (ref 3.87–5.11)
RDW: 17.2 % — ABNORMAL HIGH (ref 11.5–15.5)
WBC: 3.8 10*3/uL — AB (ref 4.0–10.5)

## 2017-06-04 LAB — MAGNESIUM: MAGNESIUM: 2.1 mg/dL (ref 1.7–2.4)

## 2017-06-04 MED ORDER — K PHOS MONO-SOD PHOS DI & MONO 155-852-130 MG PO TABS
500.0000 mg | ORAL_TABLET | Freq: Every day | ORAL | Status: DC
  Administered 2017-06-04 – 2017-06-10 (×7): 500 mg via ORAL
  Filled 2017-06-04 (×7): qty 2

## 2017-06-04 MED ORDER — INSULIN ASPART 100 UNIT/ML ~~LOC~~ SOLN
0.0000 [IU] | Freq: Three times a day (TID) | SUBCUTANEOUS | Status: DC
  Administered 2017-06-04 – 2017-06-05 (×3): 3 [IU] via SUBCUTANEOUS
  Administered 2017-06-06 – 2017-06-07 (×3): 2 [IU] via SUBCUTANEOUS
  Administered 2017-06-07: 5 [IU] via SUBCUTANEOUS
  Administered 2017-06-07: 3 [IU] via SUBCUTANEOUS
  Administered 2017-06-08: 5 [IU] via SUBCUTANEOUS
  Administered 2017-06-08 – 2017-06-10 (×5): 3 [IU] via SUBCUTANEOUS

## 2017-06-04 MED ORDER — HYDROCORTISONE NA SUCCINATE PF 100 MG IJ SOLR
50.0000 mg | Freq: Every day | INTRAMUSCULAR | Status: DC
  Administered 2017-06-05: 50 mg via INTRAVENOUS
  Filled 2017-06-04: qty 2

## 2017-06-04 MED ORDER — INSULIN ASPART 100 UNIT/ML ~~LOC~~ SOLN
0.0000 [IU] | Freq: Every day | SUBCUTANEOUS | Status: DC
  Administered 2017-06-08 – 2017-06-09 (×2): 2 [IU] via SUBCUTANEOUS

## 2017-06-04 MED ORDER — POTASSIUM PHOSPHATE MONOBASIC 500 MG PO TABS
500.0000 mg | ORAL_TABLET | Freq: Every day | ORAL | Status: DC
  Filled 2017-06-04: qty 1

## 2017-06-04 MED ORDER — POTASSIUM CHLORIDE 20 MEQ PO PACK
40.0000 meq | PACK | Freq: Once | ORAL | Status: DC
  Filled 2017-06-04: qty 2

## 2017-06-04 MED ORDER — HYDROCORTISONE NA SUCCINATE PF 100 MG IJ SOLR: 25.0000 mg | Freq: Every day | INTRAMUSCULAR | Status: DC

## 2017-06-04 MED ORDER — POTASSIUM CHLORIDE 10 MEQ/100ML IV SOLN
10.0000 meq | INTRAVENOUS | Status: AC
  Administered 2017-06-04 (×5): 10 meq via INTRAVENOUS
  Filled 2017-06-04 (×5): qty 100

## 2017-06-04 MED ORDER — PREDNISONE 10 MG PO TABS: 10.0000 mg | ORAL_TABLET | Freq: Every day | ORAL | Status: DC

## 2017-06-04 MED ORDER — DEXTROSE 5 % IV SOLN
30.0000 mmol | Freq: Once | INTRAVENOUS | Status: AC
  Administered 2017-06-04: 30 mmol via INTRAVENOUS
  Filled 2017-06-04: qty 10

## 2017-06-04 MED ORDER — SODIUM CHLORIDE 0.9 % IV BOLUS (SEPSIS)
500.0000 mL | Freq: Once | INTRAVENOUS | Status: AC
  Administered 2017-06-04: 500 mL via INTRAVENOUS

## 2017-06-04 NOTE — Consult Note (Addendum)
[name] Nurse wound consult note Reason for Consult: Consult requested for bilat buttocks. There are 2 areas of pink dry scar tissue, no open wound or drainage. Husband at bedside states she had previous wounds to these locations which healed. Dressing procedure/placement/frequency: Foam dressing to protect and promote healing. Please re-consult if further assistance is needed.  Thank-you,  Julien Girt MSN, [place], [place], [place], [place]

## 2017-06-04 NOTE — Progress Notes (Signed)
PROGRESS NOTE    Brooke Keller  XTG:[id number] DOB: 28-May-1935 DOA: 05/20/2017 PCP: Curlene Labrum, MD (Confirm with patient/family/NH records and if not entered, this HAS to be entered at [facility] point of entry. "No PCP" if truly none.)   Brief Narrative: (Start on day 1 of progress note - keep it brief and live) 82 year old WF with hx COPD on home 3 L, T2DM, HTN, CHF, and multiple myeloma who  underwent bypass surgery for non-healing ulcer of the ankle. Patient developed subsequent abdominal distention and ileus but refused NG tube placement. Patient became more altered and hypoxic overnight with nausea and vomiting. Patient had subsequent probable aspiration. Patient was noted to be in atrial fibrillation with rapid ventricular response and hypertensive. Metoprolol and Lasix were administered. Patient was transferred to the ICU and placed on noninvasive positive pressure ventilation. Patient's respiratory status remained marginal at best and ultimately she was endotracheally intubated.  She was extubated on 8/15.  She was started on broad spectrum antibiotics for Carianna Lague 7 day course to be completed today (cefepime, but also received linezolid and zosyn) and started on stress dose steroids given her chronic home regimen.  Her stay has been complicated by new onset atrial fibrillation (now on amiodarone) .  She's completed her cefepime today which will be discontinued.  Will plan for CXR tomorrow to re evaluate pleural effusion.   Assessment & Plan:   Active Problems:   IgG multiple myeloma (HCC)   COPD (chronic obstructive pulmonary disease) (HCC)   Aortoiliac occlusive disease (HCC)   Dyspnea   Pressure injury of skin   Multifocal pneumonia   Acute respiratory failure with hypoxia/positive Klebsiella & Enterobacter Pneumonia/Aspiration Pneumona -- Multifocal pneumonia, pleural effusion, COPD, CHF? -Now on 3 L O2 via [place] (home regimen) -PCXR from 8/20 shows continued moderate pleural effusion  Rt>>Lt : If no improvement by 8/23 will consider thoracentesis (CXR ordered for 8/23) -DuoNeb QID -Flutter valve: Not sure patient will have the strength to use on her own. -If unable to participate with flutter valve physiotherapy vest -Complete seven-day course antibiotics (s/p 7 days cefepime today, will d/c this)  -Titrate O2 to maintain SPO2 89 - 92%  Likely septic shock per previous notes (? Of cardiogenic shock so echo pending).  -Likely secondary to sepsis -Goal MAP> 65 & SBP> 90 [ ]  Echocardiogram pending (notably she did have elevated troponin during this hospitalization, given hx, likely due to demand, but unclear from past notes)   PVD -S/P bypass - vascular following  New onset Atrial fibrillation -Currently in NSR, brady, with PACs per tele -Amiodarone 400 mg daily -Sinus bradycardia will hold Metoprolol 2.5 mg QID  -strict I&O -Daily weight -Transfuse for hemoglobin<8 (borderline today 7.9 and yesterday, consider transfusion tomorrow if still low)  [ ]  will need to discuss anticoagulation  Acute on CKD stage III (baseline Cr 1.04-1.0) improving   Lab Results  Component Value Date   CREATININE 0.83 06/04/2017      Hypernatremia - improved today to 147.  S/p D5W 20 cc/hr x~24 hrs, will d/c this and f/u tomorrow.   Right non-healing ulcer over Right lateral malleolus  -S/P surgical repair on 8/7 by vascular surgery, see significant events -Continue care per surgery  Ileus: Resolved.  Leukocytosis:  -Improved , but now low to 3.8. - consider cefepime (now d/c'd) - daily CBC  Pancytopenia:  -Multifactorial sepsis and Zyvox and cefepime - history IgG multiple myeloma: Multiple myeloma treatment on hold -PTA on chronic  prednisone, on hold  Iatrogenic Adrenal Insufficiency -Continuing hydrocortisone 50 mg IV every 12 hours & plan for  [place] taper back to her basal Prednisone dose - discussed with pharmacy, will change to 50 mg daily for 3 days, then 25  mg for 3 days, then resume 10 mg prednisone daily (home dose)  Diabetes Mellitus Type 2 controlled with renal complication:  -3/14 Hemoglobin A1c= 5.9  Delirium -Multifactorial postop pain, chronic benzodiazepine, chronic steroids, hypernatremia -Resolving -Minimize sedating medication -- delirium precautions  Pressure Ulcer:  Stage 2 to sacrum (visualized dressing today). - wound consult   Hypokalemia (2.8 today) -Potassium goal> 4 -today ordered Potassium IV 50 mEq  And k phos 30 mmol (~44 meq) - follow up tomorrow  Hypomagnesemia -Magnesium goal> 2 -Magnesium IV 1 g  Hypophosphatemia - given 30 mmol IV and added 500 mg K phos daily  Aortic aneurysm- 3.3 cm on CT, recommend [place] in 3 years  DVT prophylaxis: heparin Code Status: full Family Communication: husband Disposition Plan: pending continued improvement, consider SNF vs home with PT   Consultants:   Vascular  Wound consult  Procedures: (Don't include imaging studies which can be auto populated. Include things that cannot be auto populated i.e. Echo, Carotid and venous dopplers, Foley, Bipap, HD, tubes/drains, wound vac, central lines etc)  Echo pending  CXR from 8/20 with R>L pleural effusions  Chest CT with RLL consolidation with patchy airspace opacities within RL and LLL compatible with multifocal pneumonia.  Trace R sided pleural fluid.  Coronary artery calcifications.  3.3 cm infrarenal aortic aneurysm.  1.2 cm nonspecific hypodensity in R hepatic lobe.  Diffuse atherosclerosis. Chronic compression deformities.   Antimicrobials: (specify start and planned stop date. Auto populated tables are space occupying and do not give end dates)  D/c today    Subjective: Asking for ice cream.  No CP, SOB, fevers, chills.  +BM.  Catheter in place.    Objective: Vitals:   06/04/17 0404 06/04/17 0545 06/04/17 0750 06/04/17 1205  BP: 94/71 109/69 115/81 120/64  Pulse: (!) 26 (!) 52 (!) 54 (!) 59  Resp: (!)  23 17 (!) 21 (!) 22  Temp: 98 F (36.7 C)  97.6 F (36.4 C) 97.7 F (36.5 C)  TempSrc: Oral  Oral Oral  SpO2: 94% 97% 96% 100%  Weight: 64.6 kg (142 lb 8 oz)     Height:        Intake/Output Summary (Last 24 hours) at 06/04/17 1840 Last data filed at 06/04/17 1803  Gross per 24 hour  Intake          1898.33 ml  Output                0 ml  Net          1898.33 ml   Filed Weights   05/30/17 0300 06/02/17 0342 06/04/17 0404  Weight: 66.1 kg (145 lb 11.6 oz) 60 kg (132 lb 4.4 oz) 64.6 kg (142 lb 8 oz)    Examination:  General exam: Appears calm and comfortable  Respiratory system: Clear to auscultation. Respiratory effort normal, decreased breath sounds. Cardiovascular system: S1 & S2 heard, RRR. No JVD, murmurs, rubs, gallops or clicks. 1+ LEE.  Palpable pedal pulses Gastrointestinal system: Abdomen is nondistended, soft and nontender. No organomegaly or masses felt. Normal bowel sounds heard. Central nervous system: Alert and oriented x1 (self). No focal neurological deficits. Extremities: 1+ LEE Skin: several pink bandages on arms/legs, ankles wrapped, sacrum with pink dressing Psychiatry: Mood &  affect appropriate.     Data Reviewed: I have personally reviewed following labs and imaging studies  CBC:  Recent Labs Lab 05/31/17 0319 06/01/17 0300 06/02/17 0400 06/03/17 0316 06/04/17 0420  WBC 8.0 6.6 5.4 4.6 3.8*  NEUTROABS 7.1 5.3 4.0 3.5 3.0  HGB 9.5* 9.3* 9.0* 7.9* 7.9*  HCT 29.8* 29.1* 29.8* 26.6* 26.2*  MCV 90.9 91.2 91.7 93.7 93.6  PLT 121* 138* 154 132* 829*   Basic Metabolic Panel:  Recent Labs Lab 05/31/17 0319 05/31/17 1802 06/01/17 0300 06/02/17 0400 06/03/17 0316 06/04/17 0420  NA 144 144 147*  147* 151* 148* 147*  K 3.4* 3.3* 3.4*  3.4* 3.0* 3.0* 2.8*  CL 110 107 109  110 112* 111 112*  CO2 21* 23 25  24 30 31 29   GLUCOSE 130* 122* 137*  132* 107* 112* 146*  BUN 41* 38* 34*  35* 24* 18 15  CREATININE 1.62* 1.52* 1.53*  1.51*  1.16* 0.89 0.83  CALCIUM 7.4* 7.5* 7.7*  7.7* 8.0* 7.9* 7.8*  MG 2.2  --  1.9 1.9 1.7 2.1  PHOS 3.7  --  2.8  2.9 2.1* 2.5 1.7*   GFR: Estimated Creatinine Clearance: 39.4 mL/min (by C-G formula based on SCr of 0.83 mg/dL). Liver Function Tests:  Recent Labs Lab 05/31/17 0319 06/01/17 0300 06/02/17 0400 06/03/17 0316 06/04/17 0420  ALBUMIN 2.2* 2.5* 2.4* 2.2* 2.2*   No results for input(s): LIPASE, AMYLASE in the last 168 hours. No results for input(s): AMMONIA in the last 168 hours. Coagulation Profile: No results for input(s): INR, PROTIME in the last 168 hours. Cardiac Enzymes: No results for input(s): CKTOTAL, CKMB, CKMBINDEX, TROPONINI in the last 168 hours. BNP (last 3 results) No results for input(s): PROBNP in the last 8760 hours. HbA1C: No results for input(s): HGBA1C in the last 72 hours. CBG:  Recent Labs Lab 06/04/17 0027 06/04/17 0402 06/04/17 0744 06/04/17 1118 06/04/17 1620  GLUCAP 136* 148* 108* 163* 158*   Lipid Profile: No results for input(s): CHOL, HDL, LDLCALC, TRIG, CHOLHDL, LDLDIRECT in the last 72 hours. Thyroid Function Tests: No results for input(s): TSH, T4TOTAL, FREET4, T3FREE, THYROIDAB in the last 72 hours. Anemia Panel: No results for input(s): VITAMINB12, FOLATE, FERRITIN, TIBC, IRON, RETICCTPCT in the last 72 hours. Sepsis Labs:  Recent Labs Lab 05/29/17 1113 05/29/17 1844 05/29/17 2046  LATICACIDVEN 1.0 1.1 1.2    No results found for this or any previous visit (from the past 240 hour(s)).       Radiology Studies: No results found.      Scheduled Meds: . amiodarone  400 mg Oral Daily  . aspirin  162 mg Per Tube Daily  . brimonidine  1 drop Both Eyes TID  . chlorhexidine  15 mL Mouth Rinse BID  . Chlorhexidine Gluconate Cloth  6 each Topical Daily  . collagenase   Topical BID  . dorzolamide-timolol  1 drop Both Eyes BID  . heparin subcutaneous  5,000 Units Subcutaneous Q8H  . [START ON 06/07/2017]  hydrocortisone sod succinate (SOLU-CORTEF) inj  25 mg Intravenous Daily  . [START ON 06/05/2017] hydrocortisone sodium succinate  50 mg Intravenous Daily  . insulin aspart  0-15 Units Subcutaneous TID WC  . insulin aspart  0-5 Units Subcutaneous QHS  . ipratropium-albuterol  3 mL Nebulization TID  . latanoprost  1 drop Both Eyes QHS  . mouth rinse  15 mL Mouth Rinse q12n4p  . pantoprazole (PROTONIX) IV  40 mg Intravenous QHS  . phosphorus  500 mg Oral Daily  . [START ON 06/10/2017] predniSONE  10 mg Oral Q breakfast   Continuous Infusions: . magnesium sulfate 1 - 4 g bolus IVPB       LOS: 15 days    Time spent: 35 minutes    Brooke Keller Melven Sartorius, MD [facility] Pager [phone]  If 7PM-7AM, please contact night-coverage [email or web address] Password [facility] 06/04/2017, 6:40 PM

## 2017-06-04 NOTE — Progress Notes (Signed)
Patient was placed on home Bipap via home FFM with her home settings.  RT bleed in 2 L of O2 to patient home BiPAP.  Patient is tolerating at this time.     06/04/17 2002  BiPAP/CPAP/SIPAP  BiPAP/CPAP/SIPAP Pt Type Adult  Mask Type Full face mask  Mask Size Medium  Respiratory Rate 19 breaths/min  Oxygen Percent 28 %  Flow Rate 2 lpm  BiPAP/CPAP/SIPAP BiPAP (home BiPAP)  Patient Home Equipment Yes  Auto Titrate No  BiPAP/CPAP /SiPAP Vitals  Bilateral Breath Sounds Diminished

## 2017-06-04 NOTE — Progress Notes (Signed)
  Speech Language Pathology Treatment: Dysphagia  Patient Details Name: Brooke Keller MRN: [id number] DOB: Jun 26, 1935 Today's Date: 06/04/2017 Time: 5170-0174 SLP Time Calculation (min) (ACUTE ONLY): 22 min  Assessment / Plan / Recommendation Clinical Impression  Pt seen for safety and efficiency with husband present. SLP provided education re: results of bedside swallow and plan although he appeared aware and demonstrated how to cue pt for smaller sips. Discussed possibility of upgrading texture and husband was happy with puree and felt it was "easier for her to chew for now." Pt has significant visual disturbance and SLP educated/demonstrated assisting pt to self feed without completely feeding her. No s/s aspiration exhibited. Needed cues to slow rate and volume. RN documentation of lung sounds are stable; chest xray ordered today. Continue Dys 1, honey thick, full supervision.      HPI HPI: 82 year old female admitted 05/20/17 for bypass surgery on ankle ulcer. Intubated for 5 days and extubated 8/15. PMH significant for COPD, chronic hypoxic respiratory failure, DM2, CHF, HTN, multiple myeloma. CXR = bilateral pleural effusions with underlying atelectasis, worsened on the left, pulmonary edema.      SLP Plan  Continue with current plan of care       Recommendations  Diet recommendations: Dysphagia 1 (puree);Honey-thick liquid Liquids provided via: Cup;No straw Medication Administration: Crushed with puree Supervision: Patient able to self feed;Full supervision/cueing for compensatory strategies Compensations: Minimize environmental distractions;Slow rate;Small sips/bites Postural Changes and/or Swallow Maneuvers: Seated upright 90 degrees;Upright 30-60 min after meal                Oral Care Recommendations: Oral care BID Follow up Recommendations: Skilled Nursing facility SLP Visit Diagnosis: Dysphagia, unspecified (R13.10) Plan: Continue with current plan of care        GO                [name] 06/04/2017, 2:30 PM   Orbie Pyo Colvin Caroli.[employer] [phone]

## 2017-06-04 NOTE — Progress Notes (Signed)
Physical Therapy Treatment Patient Details Name: Brooke Keller MRN: [id number] DOB: 1935/09/15 Today's Date: 06/04/2017    History of Present Illness Pt is an 82 y.o. female s/p R axillobifemoral bypass graft on 8/7. Post op respiratory issues and transfer to ICU and on Bipap for several days.  PMHx: CHF, COPD, DM, GERD, HTN, Multiple myeloma.    PT Comments    Pt is making progress towards her goals, however at this point PT continues to recommend SNF placement for patient to improve mobility and strength. Currently, pt is modAx2 for bed mobility and maxAx2 for transfer to recliner. PT educated pt and family on high level assist that pt continues to require and benefits of rehab for intensive therapy to regain mobility to be safe in her home environment. Pt also educated on the need for her to improve her mobility in acute setting by getting up to [facility] and sitting EoB for eating meals. Pt requires skilled PT to progress mobility and to improve LE strength and endurance to safely mobilize in her home environment.    Follow Up Recommendations  Supervision/Assistance - 24 hour;SNF     Equipment Recommendations  None recommended by PT       Precautions / Restrictions Precautions Precautions: Fall Restrictions Weight Bearing Restrictions: No    Mobility  Bed Mobility Overal bed mobility: Needs Assistance Bed Mobility: Supine to Sit;Sit to Supine     Supine to sit: +2 for physical assistance;HOB elevated;Mod assist     General bed mobility comments: modAx2 pt able to initiate movement of LE to EoB requiring modAx2 for trunk to upright and LE off bed as well as pad scoot to EoB  Transfers Overall transfer level: Needs assistance Equipment used: 2 person hand held assist Transfers: Sit to/from Stand;Stand Pivot Transfers Sit to Stand: From elevated surface;Max assist Stand pivot transfers: +2 physical assistance;Mod assist       General transfer comment: Pt required maxAx1 to  come to upright and modAx2 to stand step transfer to chair  Ambulation/Gait             General Gait Details: unable to attempt       Balance Overall balance assessment: Needs assistance Sitting-balance support: Bilateral upper extremity supported;Feet unsupported Sitting balance-Leahy Scale: Fair Sitting balance - Comments: able to sit EoB with bilateral UE assist    Standing balance support: Bilateral upper extremity supported Standing balance-Leahy Scale: Poor Standing balance comment: bil UE support with max assist of 1 to maintain standing.                             Cognition Arousal/Alertness: Awake/alert;Lethargic Behavior During Therapy: WFL for tasks assessed/performed Overall Cognitive Status: Within Functional Limits for tasks assessed                                           General Comments General comments (skin integrity, edema, etc.): VSS, Pt daughter and husband present throughout session      Pertinent Vitals/Pain Pain Assessment: Faces Faces Pain Scale: Hurts little more Pain Location: generalized Pain Descriptors / Indicators: Grimacing Pain Intervention(s): Monitored during session;Limited activity within patient's tolerance           PT Goals (current goals can now be found in the care plan section) Acute Rehab PT Goals Patient Stated Goal: return home  PT Goal Formulation: With patient Time For Goal Achievement: 06/18/17 Potential to Achieve Goals: Good Progress towards PT goals: Progressing toward goals    Frequency    Min 3X/week      PT Plan Current plan remains appropriate       AM-PAC PT "6 Clicks" Daily Activity  Outcome Measure  Difficulty turning over in bed (including adjusting bedclothes, sheets and blankets)?: Unable Difficulty moving from lying on back to sitting on the side of the bed? : Unable Difficulty sitting down on and standing up from a chair with arms (e.g., wheelchair,  bedside commode, etc,.)?: Unable Help needed moving to and from a bed to chair (including a wheelchair)?: A Lot Help needed walking in hospital room?: Total Help needed climbing 3-5 steps with a railing? : Total 6 Click Score: 7    End of Session Equipment Utilized During Treatment: Gait belt;Oxygen Activity Tolerance: Patient tolerated treatment well;Patient limited by pain Patient left: with call bell/phone within reach;with family/visitor present;in chair;with chair alarm set Nurse Communication: Mobility status;Need for lift equipment PT Visit Diagnosis: Unsteadiness on feet (R26.81);Other abnormalities of gait and mobility (R26.89);Muscle weakness (generalized) (M62.81);Pain Pain - part of body:  (generalized)     Time: 0045-9977 PT Time Calculation (min) (ACUTE ONLY): 17 min  Charges:  $Therapeutic Activity: 8-22 mins                    G Codes:       Mikhail Hallenbeck B. Migdalia Dk PT, DPT Acute Rehabilitation  [phone] Pager [phone]     [place] 06/04/2017, 4:18 PM

## 2017-06-04 NOTE — Progress Notes (Signed)
   VASCULAR SURGERY ASSESSMENT & PLAN:   15 Days Post-Op s/p: Right axillobifemoral bypass graft for critical limb ischemia.  Continue physical therapy. They have recommended a skilled nursing facility. However, the family would like to take the patient home with at all possible. If she does well with physical therapy perhaps she could go home with home health physical therapy.  She continues on cefepime for Enterobacter and Klebsiella pneumonia.  Making progress with her diet. She is currently on a dysphasia 1 diet. Appreciate speech pathology's help.  Her anemia is stable.  SUBJECTIVE:   No specific complaints.  PHYSICAL EXAM:   Vitals:   06/04/17 0305 06/04/17 0404 06/04/17 0545 06/04/17 0750  BP: 110/67 94/71 109/69 115/81  Pulse: (!) 51 (!) 26 (!) 52 (!) 54  Resp: 15 (!) 23 17 (!) 21  Temp:  98 F (36.7 C)  97.6 F (36.4 C)  TempSrc:  Oral  Oral  SpO2: 95% 94% 97% 96%  Weight:  142 lb 8 oz (64.6 kg)    Height:       Abdomen soft with normal pitch bowel sounds Incisions look fine. Lungs with scattered rhonchi.  LABS:   Lab Results  Component Value Date   WBC 3.8 (L) 06/04/2017   HGB 7.9 (L) 06/04/2017   HCT 26.2 (L) 06/04/2017   MCV 93.6 06/04/2017   PLT 136 (L) 06/04/2017   Lab Results  Component Value Date   CREATININE 0.83 06/04/2017   Lab Results  Component Value Date   INR 1.06 05/07/2017   CBG (last 3)   Recent Labs  06/04/17 0027 06/04/17 0402 06/04/17 0744  GLUCAP 136* 148* 108*    PROBLEM LIST:    Active Problems:   Aortoiliac occlusive disease (HCC)   Dyspnea   CURRENT MEDS:   . amiodarone  400 mg Oral Daily  . aspirin  162 mg Per Tube Daily  . brimonidine  1 drop Both Eyes TID  . chlorhexidine  15 mL Mouth Rinse BID  . Chlorhexidine Gluconate Cloth  6 each Topical Daily  . collagenase   Topical BID  . dorzolamide-timolol  1 drop Both Eyes BID  . heparin subcutaneous  5,000 Units Subcutaneous Q8H  . hydrocortisone sodium  succinate  50 mg Intravenous Q12H  . insulin aspart  0-15 Units Subcutaneous Q4H  . ipratropium-albuterol  3 mL Nebulization TID  . latanoprost  1 drop Both Eyes QHS  . mouth rinse  15 mL Mouth Rinse q12n4p  . pantoprazole (PROTONIX) IV  40 mg Intravenous QHS  . potassium phosphate (monobasic)  500 mg Oral Daily    Gae Gallop Beeper: [phone] Office: [phone] 06/04/2017

## 2017-06-05 ENCOUNTER — Inpatient Hospital Stay (HOSPITAL_COMMUNITY): Payer: Medicare Other

## 2017-06-05 DIAGNOSIS — I5032 Chronic diastolic (congestive) heart failure: Secondary | ICD-10-CM

## 2017-06-05 DIAGNOSIS — I272 Pulmonary hypertension, unspecified: Secondary | ICD-10-CM

## 2017-06-05 DIAGNOSIS — I340 Nonrheumatic mitral (valve) insufficiency: Secondary | ICD-10-CM

## 2017-06-05 LAB — ECHOCARDIOGRAM COMPLETE
CHL CUP MV DEC (S): 190
CHL CUP RV SYS PRESS: 69 mmHg
CHL CUP TV REG PEAK VELOCITY: 385 cm/s
E decel time: 190 msec
EERAT: 17.04
FS: 29 % (ref 28–44)
HEIGHTINCHES: 55 in
IV/PV OW: 1.28
LA ID, A-P, ES: 44 mm
LA diam end sys: 44 mm
LA vol A4C: 53.7 ml
LA vol index: 35.8 mL/m2
LA vol: 57.7 mL
LADIAMINDEX: 2.73 cm/m2
LV TDI E'LATERAL: 7.51
LVEEAVG: 17.04
LVEEMED: 17.04
LVELAT: 7.51 cm/s
LVOT SV: 78 mL
LVOT VTI: 30.7 cm
LVOT area: 2.54 cm2
LVOT diameter: 18 mm
LVOT peak grad rest: 10 mmHg
LVOT peak vel: 160 cm/s
MV VTI: 175 cm
MV pk A vel: 94.1 m/s
MVAP: 3.93 cm2
MVPG: 7 mmHg
MVPKEVEL: 128 m/s
MVSPHT: 56 ms
P 1/2 time: 463 ms
PW: 10.9 mm — AB (ref 0.6–1.1)
RV LATERAL S' VELOCITY: 11.3 cm/s
RV TAPSE: 14.8 mm
TDI e' medial: 7.62
TRMAXVEL: 385 cm/s
Weight: 2260.8 oz

## 2017-06-05 LAB — CBC WITH DIFFERENTIAL/PLATELET
BASOS PCT: 0 %
Basophils Absolute: 0 10*3/uL (ref 0.0–0.1)
EOS ABS: 0.1 10*3/uL (ref 0.0–0.7)
EOS PCT: 2 %
HCT: 27.9 % — ABNORMAL LOW (ref 36.0–46.0)
HEMOGLOBIN: 8.3 g/dL — AB (ref 12.0–15.0)
Lymphocytes Relative: 8 %
Lymphs Abs: 0.4 10*3/uL — ABNORMAL LOW (ref 0.7–4.0)
MCH: 27.9 pg (ref 26.0–34.0)
MCHC: 29.7 g/dL — AB (ref 30.0–36.0)
MCV: 93.6 fL (ref 78.0–100.0)
Monocytes Absolute: 0.6 10*3/uL (ref 0.1–1.0)
Monocytes Relative: 12 %
NEUTROS PCT: 78 %
Neutro Abs: 3.6 10*3/uL (ref 1.7–7.7)
PLATELETS: 126 10*3/uL — AB (ref 150–400)
RBC: 2.98 MIL/uL — AB (ref 3.87–5.11)
RDW: 17.2 % — ABNORMAL HIGH (ref 11.5–15.5)
WBC: 4.6 10*3/uL (ref 4.0–10.5)

## 2017-06-05 LAB — RENAL FUNCTION PANEL
ANION GAP: 5 (ref 5–15)
Albumin: 2 g/dL — ABNORMAL LOW (ref 3.5–5.0)
BUN: 14 mg/dL (ref 6–20)
CALCIUM: 7.2 mg/dL — AB (ref 8.9–10.3)
CHLORIDE: 114 mmol/L — AB (ref 101–111)
CO2: 26 mmol/L (ref 22–32)
Creatinine, Ser: 0.79 mg/dL (ref 0.44–1.00)
GFR calc Af Amer: >60 mL/min (ref >60)
GFR calc non Af Amer: >60 mL/min (ref >60)
GLUCOSE: 109 mg/dL — AB (ref 65–99)
Phosphorus: 3 mg/dL (ref 2.5–4.6)
Potassium: 2.8 mmol/L — ABNORMAL LOW (ref 3.5–5.1)
SODIUM: 145 mmol/L (ref 135–145)

## 2017-06-05 LAB — GLUCOSE, CAPILLARY
GLUCOSE-CAPILLARY: 130 mg/dL — AB (ref 65–99)
GLUCOSE-CAPILLARY: 182 mg/dL — AB (ref 65–99)
GLUCOSE-CAPILLARY: 170 mg/dL — AB (ref 65–99)
GLUCOSE-CAPILLARY: 143 mg/dL — AB (ref 65–99)

## 2017-06-05 LAB — CORTISOL: Cortisol, Plasma: 21.3 ug/dL

## 2017-06-05 LAB — MAGNESIUM: MAGNESIUM: 1.7 mg/dL (ref 1.7–2.4)

## 2017-06-05 LAB — LACTIC ACID, PLASMA: Lactic Acid, Venous: 1.2 mmol/L (ref 0.5–1.9)

## 2017-06-05 MED ORDER — AMIODARONE HCL 200 MG PO TABS
200.0000 mg | ORAL_TABLET | Freq: Every day | ORAL | Status: DC
  Administered 2017-06-06 – 2017-06-10 (×5): 200 mg via ORAL
  Filled 2017-06-05 (×6): qty 1

## 2017-06-05 MED ORDER — HYDROCORTISONE NA SUCCINATE PF 100 MG IJ SOLR
50.0000 mg | Freq: Once | INTRAMUSCULAR | Status: AC
  Administered 2017-06-05: 50 mg via INTRAVENOUS
  Filled 2017-06-05: qty 2

## 2017-06-05 MED ORDER — POTASSIUM CHLORIDE 10 MEQ/50ML IV SOLN
10.0000 meq | INTRAVENOUS | Status: AC
  Administered 2017-06-05 (×5): 10 meq via INTRAVENOUS
  Filled 2017-06-05 (×5): qty 50

## 2017-06-05 MED ORDER — SODIUM CHLORIDE 0.9 % IV BOLUS (SEPSIS)
500.0000 mL | Freq: Once | INTRAVENOUS | Status: AC
  Administered 2017-06-05: 500 mL via INTRAVENOUS

## 2017-06-05 MED ORDER — PANTOPRAZOLE SODIUM 40 MG PO TBEC
40.0000 mg | DELAYED_RELEASE_TABLET | Freq: Every day | ORAL | Status: DC
  Administered 2017-06-05 – 2017-06-09 (×5): 40 mg via ORAL
  Filled 2017-06-05 (×5): qty 1

## 2017-06-05 MED ORDER — HYDROCORTISONE NA SUCCINATE PF 100 MG IJ SOLR
50.0000 mg | Freq: Three times a day (TID) | INTRAMUSCULAR | Status: DC
  Administered 2017-06-05 – 2017-06-07 (×6): 50 mg via INTRAVENOUS
  Filled 2017-06-05 (×6): qty 2

## 2017-06-05 MED ORDER — DEXTROSE 5 % IV SOLN
INTRAVENOUS | Status: DC
  Administered 2017-06-05: 22:00:00 via INTRAVENOUS

## 2017-06-05 MED ORDER — MAGNESIUM SULFATE 50 % IJ SOLN: 1.0000 g | Freq: Once | INTRAMUSCULAR | Status: DC

## 2017-06-05 MED ORDER — MAGNESIUM SULFATE IN D5W 1-5 GM/100ML-% IV SOLN
1.0000 g | Freq: Once | INTRAVENOUS | Status: AC
  Administered 2017-06-05: 1 g via INTRAVENOUS
  Filled 2017-06-05: qty 100

## 2017-06-05 NOTE — Care Management Note (Signed)
Case Management Note Marvetta Gibbons RN, BSN Unit 4E-Case Manager [phone]  Patient Details  Name: Brooke Keller MRN: [id number] Date of Birth: April 30, 1935  Subjective/Objective:   Pt admitted  s/p: Right axillobifemoral bypass.- has prevena wound vacs placed               Action/Plan: PTA pt lived at home with spouse- PT/OT evals ordered/pending- CM will follow for recommendations and d/c needs  Expected Discharge Date:                  Expected Discharge Plan:  [place]  In-House Referral:  Clinical Social Work  Discharge planning Services  CM Consult  Post Acute Care Choice:  Home Health Choice offered to:     DME Arranged:    DME Agency:     HH Arranged:    [place] Agency:     Status of Service:  In process, will continue to follow  If discussed at Long Length of Stay Meetings, dates discussed:  8/21, 8/23  Discharge Disposition:   Additional Comments:  06/05/17- 1520- Cyrstal Leitz RN, CM- per PT- recommendation for SNF- CSW following for possible placement- if pt progresses enough family would like to take pt home- CM to follow  Dawayne Patricia, RN 06/05/2017, 3:29 PM

## 2017-06-05 NOTE — Progress Notes (Addendum)
@   approx. 0105 Paged Dr. Hal Hope of [facility] regarding pt's low BPs (70s-80s SBP confirmed manually). Order received for bolus. Will administer and continue to monitor.  Bolus administered without real response in BP. MD re-paged and additional bolus ordered along with STAT Lactic Acid post Bolus administration.  Paged MD 3rd time @ [phone] with LA results (1.2) and persistent low BP (70s-80s SBP). 3rd 500cc bolus ordered along with STAT cortisol level.  @approx . Marly.Nickels MD called RN back requesting potassium replacement along with initiation of CVP monitoring.  @0530  Pt's BP began stabilizing SBP in 90s. See Flow Sheet for full vitals.

## 2017-06-05 NOTE — Progress Notes (Signed)
  Echocardiogram 2D Echocardiogram has been performed.  Brooke Keller G Hooper [place] 06/05/2017, 11:07 AM

## 2017-06-05 NOTE — Progress Notes (Addendum)
Late Entry: Paged K. Schorr of [facility] regarding pt's low BPs (70s-80s SBP confirmed manually). Order received for bolus and administered. BP respond and SBP increased to 90s-100s.

## 2017-06-05 NOTE — Progress Notes (Signed)
Clinical Social Worker is following patient/family for support and discharge need. Patient's spouse is agreeable for patient to discharge to [facility]. CSW reached out to facility and admission coordinator stated they are able to offer patient a bed. CSW is awaiting for patient to be medically stable before transitioning patient to facility.   Rhea Pink, MSW,  [place]

## 2017-06-05 NOTE — Progress Notes (Signed)
Occupational Therapy Treatment Patient Details Name: Brooke Keller MRN: [id number] DOB: 05/24/1935 Today's Date: 06/05/2017    History of present illness Pt is an 82 y.o. female s/p R axillobifemoral bypass graft on 8/7. Post op respiratory issues and transfer to ICU and on Bipap for several days.  PMHx: CHF, COPD, DM, GERD, HTN, Multiple myeloma.   OT comments  This 82 yo female admitted and underwent above presents to acute OT having had medical decline since eval, but starting to get some strength back. She was able to get OOB to bedside commode and recliner today with therapy. She will continue to benefit from acute OT with follow up OT at SNF to work towards A min A level or better. Goals updated this session.   Follow Up Recommendations  SNF;Supervision/Assistance - 24 hour    Equipment Recommendations  Other (comment) (TBD at next venue)       Precautions / Restrictions Precautions Precautions: Fall Restrictions Weight Bearing Restrictions: No       Mobility Bed Mobility Overal bed mobility: Needs Assistance Bed Mobility: Supine to Sit     Supine to sit: Mod assist;HOB elevated     General bed mobility comments: VCs for squencing and using bed rail  Transfers Overall transfer level: Needs assistance Equipment used: 2 person hand held assist Transfers: Sit to/from [employer] transfer comment: see toileting transfer for details    Balance Overall balance assessment: Needs assistance Sitting-balance support: No upper extremity supported;Feet unsupported (pt is 4'4" and feet do not go flat on floor when sitting EOB) Sitting balance-Leahy Scale: Fair     Standing balance support: Bilateral upper extremity supported Standing balance-Leahy Scale: Poor Standing balance comment: bil UE support with mod assist of 1 for stand pivot from bed to 3n1; but +2 mod A for sit<>stand from 3n1 (lower) with pt sitting x2 without warning (pt  "gave out")                           ADL either performed or assessed with clinical judgement   ADL Overall ADL's : Needs assistance/impaired                           Toilet Transfer Details (indicate cue type and reason): bed>3n1 (Mod A); 3n1>recliner (mod A +2)---pt is 4'4" so from regular bed height she needs less A; however from lower 3n1 height to meet her height her legs are weak and she cannot stand up without +2 A Toileting- Clothing Manipulation and Hygiene: Total assistance Toileting - Clothing Manipulation Details (indicate cue type and reason): +2 Mod A sit<>stand             Vision Baseline Vision/History: Legally blind Patient Visual Report: No change from baseline Additional Comments: She cannot see out of left eye at all and has glaucoma in right eye          Cognition Arousal/Alertness: Awake/alert Behavior During Therapy: WFL for tasks assessed/performed Overall Cognitive Status: Within Functional Limits for tasks assessed                                                     Pertinent Vitals/ Pain  Pain Assessment: No/denies pain         Frequency  Min 2X/week        Progress Toward Goals  OT Goals(current goals can now be found in the care plan section)  Progress towards OT goals: Progressing toward goals     Plan Discharge plan needs to be updated       AM-PAC PT "6 Clicks" Daily Activity     Outcome Measure   Help from another person eating meals?: A Lot Help from another person taking care of personal grooming?: A Lot Help from another person toileting, which includes using toliet, bedpan, or urinal?: A Lot Help from another person bathing (including washing, rinsing, drying)?: A Lot Help from another person to put on and taking off regular upper body clothing?: Total Help from another person to put on and taking off regular lower body clothing?: Total 6 Click Score: 10    End of  Session Equipment Utilized During Treatment: Gait belt  OT Visit Diagnosis: Unsteadiness on feet (R26.81);Other abnormalities of gait and mobility (R26.89);Muscle weakness (generalized) (M62.81)   Activity Tolerance Patient tolerated treatment well   Patient Left in chair;with call bell/phone within reach;with family/visitor present   Nurse Communication  (NT in room to A [place] with toileting hygiene and changing out 3n1 for recliner)        Time: 9340-6840 OT Time Calculation (min): 35 min  Charges: OT Evaluation $OT Eval Low Complexity: 1 Procedure OT Treatments $Self Care/Home Management : 23-37 mins [place], OTR/L [phone] 06/05/2017

## 2017-06-05 NOTE — Progress Notes (Signed)
   VASCULAR SURGERY ASSESSMENT & PLAN:   16 Days Post-Op s/p: Right axillobifemoral bypass graft for critical limb ischemia.  Had some issues with her blood pressure being low last night however this improved with some fluid resuscitation.  Continue physical therapy. They have recommended a skilled nursing facility. This is being worked on with the family.  She continues on cefepime for Enterobacter and Klebsiella pneumonia.  Making progress with her diet. She is currently on a dysphasia 1 diet. Appreciate speech pathology's help.  Her anemia is slightly improved.   SUBJECTIVE:   Other complaints.  PHYSICAL EXAM:   Vitals:   06/05/17 0630 06/05/17 0700 06/05/17 0715 06/05/17 0752  BP: 94/69 (!) 82/59 93/69   Pulse: 62 64 (!) 37   Resp: (!) _0 Temp:      TempSrc:      SpO2: 99% 99% 100% 99%  Weight:      Height:       All of her incisions look good. Good posterior tibial signals with the Doppler. Right lateral malleolar wound is stable without erythema or drainage. Left heel wound is stable without erythema or drainage.  LABS:   Lab Results  Component Value Date   WBC 4.6 06/05/2017   HGB 8.3 (L) 06/05/2017   HCT 27.9 (L) 06/05/2017   MCV 93.6 06/05/2017   PLT 126 (L) 06/05/2017   Lab Results  Component Value Date   CREATININE 0.79 06/05/2017   Lab Results  Component Value Date   INR 1.06 05/07/2017   CBG (last 3)   Recent Labs  06/04/17 1620 06/04/17 2021 06/05/17 0614  GLUCAP 158* 111* 130*    PROBLEM LIST:    Active Problems:   IgG multiple myeloma (HCC)   COPD (chronic obstructive pulmonary disease) (HCC)   Aortoiliac occlusive disease (HCC)   Dyspnea   Pressure injury of skin   Multifocal pneumonia   CURRENT MEDS:   . amiodarone  400 mg Oral Daily  . aspirin  162 mg Per Tube Daily  . brimonidine  1 drop Both Eyes TID  . chlorhexidine  15 mL Mouth Rinse BID  . Chlorhexidine Gluconate Cloth  6 each Topical Daily  .  collagenase   Topical BID  . dorzolamide-timolol  1 drop Both Eyes BID  . heparin subcutaneous  5,000 Units Subcutaneous Q8H  . [START ON 06/07/2017] hydrocortisone sod succinate (SOLU-CORTEF) inj  25 mg Intravenous Daily  . hydrocortisone sodium succinate  50 mg Intravenous Daily  . insulin aspart  0-15 Units Subcutaneous TID WC  . insulin aspart  0-5 Units Subcutaneous QHS  . ipratropium-albuterol  3 mL Nebulization TID  . latanoprost  1 drop Both Eyes QHS  . mouth rinse  15 mL Mouth Rinse q12n4p  . pantoprazole  40 mg Oral QHS  . phosphorus  500 mg Oral Daily  . [START ON 06/10/2017] predniSONE  10 mg Oral Q breakfast    Gae Gallop Beeper: [phone] Office: [phone] 06/05/2017

## 2017-06-06 DIAGNOSIS — E1165 Type 2 diabetes mellitus with hyperglycemia: Secondary | ICD-10-CM

## 2017-06-06 DIAGNOSIS — E43 Unspecified severe protein-calorie malnutrition: Secondary | ICD-10-CM

## 2017-06-06 DIAGNOSIS — R410 Disorientation, unspecified: Secondary | ICD-10-CM

## 2017-06-06 DIAGNOSIS — C9000 Multiple myeloma not having achieved remission: Secondary | ICD-10-CM

## 2017-06-06 DIAGNOSIS — I7409 Other arterial embolism and thrombosis of abdominal aorta: Principal | ICD-10-CM

## 2017-06-06 DIAGNOSIS — E1129 Type 2 diabetes mellitus with other diabetic kidney complication: Secondary | ICD-10-CM

## 2017-06-06 DIAGNOSIS — Z515 Encounter for palliative care: Secondary | ICD-10-CM

## 2017-06-06 DIAGNOSIS — J189 Pneumonia, unspecified organism: Secondary | ICD-10-CM

## 2017-06-06 DIAGNOSIS — N179 Acute kidney failure, unspecified: Secondary | ICD-10-CM

## 2017-06-06 LAB — RENAL FUNCTION PANEL
Albumin: 2 g/dL — ABNORMAL LOW (ref 3.5–5.0)
Anion gap: 9 (ref 5–15)
BUN: 12 mg/dL (ref 6–20)
CALCIUM: 7.4 mg/dL — AB (ref 8.9–10.3)
CO2: 25 mmol/L (ref 22–32)
Chloride: 110 mmol/L (ref 101–111)
Creatinine, Ser: 0.76 mg/dL (ref 0.44–1.00)
GFR calc Af Amer: >60 mL/min (ref >60)
GFR calc non Af Amer: >60 mL/min (ref >60)
GLUCOSE: 152 mg/dL — AB (ref 65–99)
POTASSIUM: 3.6 mmol/L (ref 3.5–5.1)
Phosphorus: 2.9 mg/dL (ref 2.5–4.6)
SODIUM: 144 mmol/L (ref 135–145)

## 2017-06-06 LAB — CBC WITH DIFFERENTIAL/PLATELET
Basophils Absolute: 0 10*3/uL (ref 0.0–0.1)
Basophils Relative: 0 %
EOS ABS: 0 10*3/uL (ref 0.0–0.7)
Eosinophils Relative: 0 %
HCT: 26 % — ABNORMAL LOW (ref 36.0–46.0)
HEMOGLOBIN: 7.8 g/dL — AB (ref 12.0–15.0)
LYMPHS ABS: 0.3 10*3/uL — AB (ref 0.7–4.0)
Lymphocytes Relative: 7 %
MCH: 28 pg (ref 26.0–34.0)
MCHC: 30 g/dL (ref 30.0–36.0)
MCV: 93.2 fL (ref 78.0–100.0)
MONO ABS: 0.3 10*3/uL (ref 0.1–1.0)
MONOS PCT: 6 %
NEUTROS PCT: 87 %
Neutro Abs: 3.5 10*3/uL (ref 1.7–7.7)
Platelets: 111 10*3/uL — ABNORMAL LOW (ref 150–400)
RBC: 2.79 MIL/uL — ABNORMAL LOW (ref 3.87–5.11)
RDW: 17.3 % — ABNORMAL HIGH (ref 11.5–15.5)
WBC: 4.1 10*3/uL (ref 4.0–10.5)

## 2017-06-06 LAB — PREPARE RBC (CROSSMATCH)

## 2017-06-06 LAB — GLUCOSE, CAPILLARY
GLUCOSE-CAPILLARY: 146 mg/dL — AB (ref 65–99)
Glucose-Capillary: 121 mg/dL — ABNORMAL HIGH (ref 65–99)
Glucose-Capillary: 167 mg/dL — ABNORMAL HIGH (ref 65–99)

## 2017-06-06 LAB — MAGNESIUM: Magnesium: 2 mg/dL (ref 1.7–2.4)

## 2017-06-06 MED ORDER — POTASSIUM CHLORIDE 10 MEQ/100ML IV SOLN
10.0000 meq | INTRAVENOUS | Status: AC
  Administered 2017-06-06 (×2): 10 meq via INTRAVENOUS

## 2017-06-06 MED ORDER — FUROSEMIDE 10 MG/ML IJ SOLN
20.0000 mg | Freq: Once | INTRAMUSCULAR | Status: AC
  Administered 2017-06-06: 20 mg via INTRAVENOUS
  Filled 2017-06-06: qty 2

## 2017-06-06 MED ORDER — POTASSIUM CHLORIDE 10 MEQ/100ML IV SOLN
10.0000 meq | INTRAVENOUS | Status: AC
  Administered 2017-06-06 (×3): 10 meq via INTRAVENOUS
  Filled 2017-06-06 (×2): qty 100

## 2017-06-06 MED ORDER — PREDNISONE 10 MG PO TABS
10.0000 mg | ORAL_TABLET | Freq: Every day | ORAL | Status: DC
  Administered 2017-06-07 – 2017-06-10 (×4): 10 mg via ORAL
  Filled 2017-06-06 (×5): qty 1

## 2017-06-06 MED ORDER — ALBUTEROL SULFATE (2.5 MG/3ML) 0.083% IN NEBU: 2.5000 mg | INHALATION_SOLUTION | RESPIRATORY_TRACT | Status: DC | PRN

## 2017-06-06 MED ORDER — ENSURE ENLIVE PO LIQD
237.0000 mL | Freq: Three times a day (TID) | ORAL | Status: DC
  Administered 2017-06-06 – 2017-06-08 (×6): 237 mL via ORAL

## 2017-06-06 MED ORDER — SODIUM CHLORIDE 0.9 % IV SOLN
Freq: Once | INTRAVENOUS | Status: AC
  Administered 2017-06-06: 23:00:00 via INTRAVENOUS

## 2017-06-06 MED ORDER — IPRATROPIUM-ALBUTEROL 0.5-2.5 (3) MG/3ML IN SOLN
3.0000 mL | Freq: Two times a day (BID) | RESPIRATORY_TRACT | Status: DC
  Administered 2017-06-06 – 2017-06-10 (×7): 3 mL via RESPIRATORY_TRACT
  Filled 2017-06-06 (×7): qty 3

## 2017-06-06 NOTE — Consult Note (Addendum)
Consultation Note Date: 06/06/2017   Patient Name: Brooke Keller  DOB: 06/01/1935  MRN: [id number]  Age / Sex: 82 y.o., female  PCP: Pleas Koch Virgina Evener, MD Referring Physician: Allie Bossier, MD  Reason for Consultation: Establishing goals of care, Hospice Evaluation and Psychosocial/spiritual support  HPI/Patient Profile: 82 y.o. female  with past medical history of Peripheral vascular disease postop 17 days for right axial bifemoral bypass, COPD on home O2 (hypoxic as well as hypercarbic respiratory failure; on Trilogy machine at home), diabetes, diastolic heart failure, multiple myeloma admitted on 05/20/2017 with plans for axillobifemoral bypass. Patient has had multiple complications and required intubation during this hospitalization. Patient has been ill for quite some time per her daughter's with hypercarbic respiratory failure. Daughters share that they have been told going back 5 years that their mother only had a life expectancy of days to weeks (patient became acutely ill when she was initially diagnosed with multiple myeloma). They see a sharp decline in their mother's health over the past month particularly with this hospitalization status post surgery;  now with shortness of breath secondary to diastolic heart failure.. Patient has anasarca, with weeping edema, albumin 2.0  Consult requested for goals of care discussion. During this hospitalization patient's husband had to be hospitalized and he is currently in [place] and Goals of Care: Patient is alert and can answer some brief simple questions. She appears withdrawn, and speaks mostly to her family that are at the bedside. She denies shortness of breath but she appears short of breath at rest. She is also denying pain.   Daughters asked to meet outside of the room. They share that their mother after she was intubated during  this hospitalization stated that she never wanted to go through that again. Patient also in the past has made a video that she shares with her family about her funeral arrangements as well as advance directives. Daughters Lenna Sciara and Lattie Haw both share that they believe their mother would want to be a DO NOT RESUSCITATE but would like to talk to her privately this evening.  Also in the past they have pursued hospice of [facility] but were told as long as patient was on a Trilogy machine they would not accept her. This is something that family would wish to continue., but would also be amenable to hospice if she could remain on a Trilogy machine  Patient at this point  is able to participate in goals of care discussion. In the event she were unable to speak for herself, her healthcare power of attorney is her daughter, Grant Fontana [phone]. Melissa, her sister Lattie Haw, as well as patient's husband, all make decisions jointly    SUMMARY OF RECOMMENDATIONS   Full code for now. Daughters planned to speak privately with her mother this evening about goals of care Palliative medicine provider to meet with daughters again on 06/07/2017 at 29 AM Family would be interested in hospice support in the home or in a facility but want to continue  on Trilogy machine. Palliative medicine staff to assist with ascertaining whether hospice in their service area could accommodate this (this likely cannot happen until Monday) Code Status/Advance Care Planning:  Full code    Symptom Management:   Dyspnea: Continue with targeted pulmonary treatments: Oxygen nebulizer treatment as well as Trilogy machine at night and when necessary  Pain: Continue with fentanyl 25 g every 2 hours as needed for severe pain  Palliative Prophylaxis:   Aspiration, Bowel Regimen, Delirium Protocol, Eye Care, Frequent Pain Assessment, Oral Care and Turn Reposition  Additional Recommendations (Limitations, Scope,  Preferences):  Full Scope Treatment  Psycho-social/Spiritual:   Desire for further Chaplaincy support:no  Additional Recommendations: Grief/Bereavement Support and Referral to [employer]   Prognosis:   < 6 months in the setting of severe peripheral vascular disease, end-stage COPD (hypoxic as well as hypercarbic respiratory failure) diastolic heart failure, multiple myeloma with acute functional decline over the past 30 days; protein calorie malnutrition with an albumin of 2.0  Discharge Planning: To Be Determined      Primary Diagnoses: Present on Admission: . Aortoiliac occlusive disease ([place]) . IgG multiple myeloma ([place]) . COPD (chronic obstructive pulmonary disease) ([place])   I have reviewed the medical record, interviewed the patient and family, and examined the patient. The following aspects are pertinent.  Past Medical History:  Diagnosis Date  . Anemia   . CHF (congestive heart failure) ([place])   . COPD (chronic obstructive pulmonary disease) ([place])   . Diabetes mellitus    type 2  . GERD (gastroesophageal reflux disease)   . Hypertension   . IgG multiple myeloma ([place]) 04/14/2016  . Multiple myeloma   . Peripheral vascular disease ([place])   . Supplemental oxygen dependent    2L o2 she should be coming off oxygen soon   Social History   Social History  . Marital status: Married    Spouse name: Arnell Sieving  . Number of children: N/A  . Years of education: N/A   Social History Main Topics  . Smoking status: Former Smoker    Quit date: 09/30/2007  . Smokeless tobacco: Former Systems developer    Quit date: 09/30/2006  . Alcohol use No  . Drug use: No  . Sexual activity: Yes    Partners: Female   Other Topics Concern  . None   Social History Narrative  . None   Family History  Problem Relation Age of Onset  . Heart disease Mother    Scheduled Meds: . amiodarone  200 mg Oral Daily  . aspirin  162 mg Per Tube Daily  . brimonidine  1 drop Both Eyes TID  .  chlorhexidine  15 mL Mouth Rinse BID  . Chlorhexidine Gluconate Cloth  6 each Topical Daily  . collagenase   Topical BID  . dorzolamide-timolol  1 drop Both Eyes BID  . heparin subcutaneous  5,000 Units Subcutaneous Q8H  . hydrocortisone sodium succinate  50 mg Intravenous Q8H  . insulin aspart  0-15 Units Subcutaneous TID WC  . insulin aspart  0-5 Units Subcutaneous QHS  . ipratropium-albuterol  3 mL Nebulization BID  . latanoprost  1 drop Both Eyes QHS  . mouth rinse  15 mL Mouth Rinse q12n4p  . pantoprazole  40 mg Oral QHS  . phosphorus  500 mg Oral Daily  . [START ON 06/10/2017] predniSONE  10 mg Oral Q breakfast   Continuous Infusions: . dextrose 30 mL/hr at 06/06/17 1600  . magnesium sulfate 1 - 4 g bolus  IVPB     PRN Meds:.acetaminophen **OR** acetaminophen, albuterol, fentaNYL (SUBLIMAZE) injection, fluticasone, hydrALAZINE, labetalol, magnesium sulfate 1 - 4 g bolus IVPB, ondansetron, RESOURCE THICKENUP CLEAR Medications Prior to Admission:  Prior to Admission medications   Medication Sig Start Date End Date Taking? Authorizing Provider  ALPRAZolam Duanne Moron) 1 MG tablet Take 1 tablet (1 mg total) by mouth 2 (two) times daily as needed for anxiety. 01/15/17  Yes Twana First, MD  aspirin EC 81 MG tablet Take 81 mg by mouth daily.    Yes [provider]  atorvastatin (LIPITOR) 10 MG tablet Take 10 mg by mouth daily.    Yes [provider]  brimonidine (ALPHAGAN) 0.2 % ophthalmic solution INSTILL ONE DROP IN [facility] EYE THREE TIMES DAILY 03/29/16  Yes [provider]  Calcium Carbonate-Vitamin D 600-400 MG-UNIT tablet Take 1 tablet by mouth 2 (two) times daily. 08/09/16  Yes [provider]  dorzolamide-timolol (COSOPT) 22.3-6.8 MG/ML ophthalmic solution Place 1 drop into both eyes 2 (two) times daily. 03/29/16  Yes [provider]  fluticasone (FLONASE) 50 MCG/ACT nasal spray Place 2 sprays into both nostrils daily as needed for rhinitis  (DRYNESS).  10/01/16  Yes [provider]  furosemide (LASIX) 40 MG tablet Take 40 mg by mouth twice daily 03/29/16  Yes [provider]  HYDROcodone-acetaminophen (NORCO) 10-325 MG tablet Take 1 tablet by mouth every 4 (four) hours as needed. for pain 03/03/17  Yes Twana First, MD  ipratropium-albuterol (DUONEB) 0.5-2.5 (3) MG/3ML SOLN Take 3 mLs by nebulization 2 (two) times daily.     Yes [provider]  lenalidomide (REVLIMID) 10 MG capsule Take 1 capsule (10 mg total) by mouth every other day. 04/10/17  Yes Twana First, MD  LUMIGAN 0.01 % SOLN INSTILL ONE DROP IN [facility] EYE AT BEDTIME 03/29/16  Yes [provider]  metFORMIN (GLUCOPHAGE) 500 MG tablet Take 500 mg by mouth daily with breakfast.   Yes [provider]  Multiple Vitamins-Minerals (MULTIVITAMIN ADULTS 50+ PO) TAKE ONE TABLET BY MOUTH DAILY 09/23/16  Yes [provider]  polyethylene glycol powder (GLYCOLAX/MIRALAX) powder Take 1 Container by mouth daily as needed for mild constipation.  08/20/16  Yes [provider]  potassium chloride SA (K-DUR,KLOR-CON) 20 MEQ tablet Take 20 mEq by mouth 2 (two) times daily. 03/21/16  Yes [provider]  predniSONE (DELTASONE) 10 MG tablet Take 10 mg by mouth daily.    Yes [provider]  silver sulfADIAZINE (SILVADENE) 1 % cream Apply 1 application topically daily.   Yes [provider]  nystatin cream (MYCOSTATIN) APPLY TO THE AFFECTED AREA  DAILY AS NEEDED IRRITATION 03/18/16   [provider]   Allergies  Allergen Reactions  . Ativan [Lorazepam] Other (See Comments)    hallucinations  . Amitriptyline Other (See Comments)    HALLUCINATIONS   Review of Systems  Physical Exam  Vital Signs: BP (!) 144/62 (BP Location: Left Arm)   Pulse (!) 52   Temp 97.7 F (36.5 C) (Oral)   Resp (!) 21   Ht 4' 7"  (1.397 m)   Wt 62.7 kg (138 lb 3.2 oz)   SpO2 100%   BMI 32.12 kg/m  Pain Assessment:  No/denies pain   Pain Score: 0-No pain   SpO2: SpO2: 100 % O2 Device:SpO2: 100 % O2 Flow Rate: .O2 Flow Rate (L/min): 2 L/min  IO: Intake/output summary:  Intake/Output Summary (Last 24 hours) at 06/06/17 1646 Last data filed at 06/06/17 1600  Gross per 24 hour  Intake           1152.5 ml  Output                0 ml  Net           1152.5 ml    LBM: Last BM Date: 06/06/17 Baseline Weight: Weight: 59.6 kg (131 lb 4.8 oz) Most recent weight: Weight: 62.7 kg (138 lb 3.2 oz)     Palliative Assessment/Data:   Flowsheet Rows     Most Recent Value  Intake Tab  Referral Department  Hospitalist  Unit at Time of Referral  Med/Surg Unit  Palliative Care Primary Diagnosis  Pulmonary  Date Notified  06/06/17  Palliative Care Type  New Palliative care  Reason for referral  Clarify Goals of Care  Date of Admission  05/20/17  Date first seen by Palliative Care  06/06/17  # of days Palliative referral response time  0 Day(s)  # of days IP prior to Palliative referral  17  Clinical Assessment  Palliative Performance Scale Score  30%  Pain Max last 24 hours  Not able to report  Pain Min Last 24 hours  Not able to report  Dyspnea Max Last 24 Hours  Not able to report  Dyspnea Min Last 24 hours  Not able to report  Nausea Max Last 24 Hours  Not able to report  Nausea Min Last 24 Hours  Not able to report  Anxiety Max Last 24 Hours  Not able to report  Anxiety Min Last 24 Hours  Not able to report  Other Max Last 24 Hours  Not able to report  Psychosocial & Spiritual Assessment  Palliative Care Outcomes  Patient/Family meeting held?  Yes  Who was at the meeting?  pt, her 2 dtr's, 1 is [place]  Provided psychosocial or spiritual support, Counseled regarding hospice  Palliative Care follow-up planned  Yes, Facility      Time In: 1550 Time Out: 1700 Time Total: 70 min Greater than 50%  of this time was spent counseling and coordinating care related to the  above assessment and plan.  Signed by: Dory Horn, NP   Please contact Palliative Medicine Team phone at [phone] for questions and concerns.  For individual provider: See Shea Evans

## 2017-06-06 NOTE — Progress Notes (Signed)
Physical Therapy Treatment Patient Details Name: Brooke Keller MRN: [id number] DOB: 09-08-1935 Today's Date: 06/06/2017    History of Present Illness Pt is an 82 y.o. female s/p R axillobifemoral bypass graft on 8/7. Post op respiratory issues and transfer to ICU and on Bipap for several days.  PMHx: CHF, COPD, DM, GERD, HTN, Multiple myeloma.    PT Comments    Pt limited by pain and nausea today and only agreeable to supine exercises. Pt requires skilled PT to progress mobility training and to improve strength and endurance to mobilize in her discharge environment.    Follow Up Recommendations  Supervision/Assistance - 24 hour;SNF     Equipment Recommendations  None recommended by PT       Precautions / Restrictions Precautions Precautions: Fall Restrictions Weight Bearing Restrictions: No          Cognition Arousal/Alertness: Awake/alert;Lethargic Behavior During Therapy: WFL for tasks assessed/performed Overall Cognitive Status: Within Functional Limits for tasks assessed                                        Exercises General Exercises - Upper Extremity Shoulder ABduction: AROM;10 reps;Supine;Both Shoulder ADduction: AROM;10 reps;Supine;Both Low Level/ICU Exercises Ankle Circles/Pumps: AROM;Both;10 reps;Supine Quad Sets: AROM;Both;10 reps;Supine Hip ABduction/ADduction: AROM;Both;10 reps;Supine Heel Slides: AROM;Both;10 reps;Supine Breathing Exercises: AROM;10 reps;Supine Shoulder Flexion: AROM;Both;10 reps;Supine Elbow Flexion: AROM;Both;10 reps;Supine    General Comments General comments (skin integrity, edema, etc.): VSS, daughter present in room for session      Pertinent Vitals/Pain Pain Assessment: 0-10 Pain Score: 8  Faces Pain Scale: Hurts little more Pain Location: stomach Pain Descriptors / Indicators: Grimacing (nauseated) Pain Intervention(s): Monitored during session;Limited activity within patient's tolerance            PT Goals (current goals can now be found in the care plan section) Acute Rehab PT Goals Patient Stated Goal: return home PT Goal Formulation: With patient Time For Goal Achievement: 06/18/17 Potential to Achieve Goals: Good Progress towards PT goals: PT to reassess next treatment    Frequency    Min 3X/week      PT Plan Current plan remains appropriate       AM-PAC PT "6 Clicks" Daily Activity  Outcome Measure  Difficulty turning over in bed (including adjusting bedclothes, sheets and blankets)?: Unable Difficulty moving from lying on back to sitting on the side of the bed? : Unable Difficulty sitting down on and standing up from a chair with arms (e.g., wheelchair, bedside commode, etc,.)?: Unable Help needed moving to and from a bed to chair (including a wheelchair)?: A Lot Help needed walking in hospital room?: Total Help needed climbing 3-5 steps with a railing? : Total 6 Click Score: 7    End of Session Equipment Utilized During Treatment: Gait belt;Oxygen Activity Tolerance: Patient limited by pain;Other (comment) (nauseated) Patient left: with call bell/phone within reach;with family/visitor present;in chair;with chair alarm set Nurse Communication: Mobility status;Need for lift equipment PT Visit Diagnosis: Unsteadiness on feet (R26.81);Other abnormalities of gait and mobility (R26.89);Muscle weakness (generalized) (M62.81);Pain Pain - part of body:  (stomach)     Time: 6270-3500 PT Time Calculation (min) (ACUTE ONLY): 11 min  Charges:  $Therapeutic Exercise: 8-22 mins                    G Codes:       Brooke Keller B. Migdalia Dk PT, DPT Acute  Rehabilitation  [phone] Pager [phone]     [place] 06/06/2017, 4:25 PM

## 2017-06-06 NOTE — Progress Notes (Addendum)
Progress Note  SUBJECTIVE:    Asking to call her daughter. No complaints.   OBJECTIVE:   Vitals:   06/06/17 0722 06/06/17 0800  BP:  (!) 167/64  Pulse:  (!) 56  Resp:  17  Temp:  98.1 F (36.7 C)  SpO2: 100% 100%    Intake/Output Summary (Last 24 hours) at 06/06/17 0919 Last data filed at 06/06/17 0655  Gross per 24 hour  Intake              970 ml  Output                0 ml  Net              970 ml   Right infraclavicular and bilateral groin incisions clean. Bilateral heel wounds are clean without active drainage. No surrounding erythema. Bilateral PT doppler signals.   ASSESSMENT/PLAN:   82 y.o. female is s/p: right axillobifemoral bypass 17 Days Post-Op   Respiratory status stable on 2L [place]. Has completed 7 day course of cefepime for Enterobacter and Klebsiella pneumonia. Wounds are clean. Continue daily wound care and dry dressing to groin incisions.  Continues on dysphagia 1 diet as recommended by SLP. Appreciate their assistance.  Hypokalemia resolved.  Anemia: Hgb 7.8 stable.  Continue mobilization.  PT and OT continuing to recommend SNF. Patient still wants to go home with family.   [name] 06/06/2017 9:19 AM   I have interviewed the patient and examined the patient. I agree with the findings by the PA. Continue PTx Making progress with diet.  For SNF.  Gae Gallop, MD [phone]   LABS:   CBC    Component Value Date/Time   WBC 4.1 06/06/2017 0350   HGB 7.8 (L) 06/06/2017 0350   HCT 26.0 (L) 06/06/2017 0350   PLT 111 (L) 06/06/2017 0350    BMET    Component Value Date/Time   NA 144 06/06/2017 0350   K 3.6 06/06/2017 0350   CL 110 06/06/2017 0350   CO2 25 06/06/2017 0350   GLUCOSE 152 (H) 06/06/2017 0350   BUN 12 06/06/2017 0350   CREATININE 0.76 06/06/2017 0350   CALCIUM 7.4 (L) 06/06/2017 0350   GFRNONAA >60 06/06/2017 0350   GFRAA >60 06/06/2017 0350    ANTIBIOTICS:   Anti-infectives    Start     Dose/Rate  Route Frequency Ordered Stop   06/03/17 1000  ceFEPIme (MAXIPIME) 2 g in dextrose 5 % 50 mL IVPB  Status:  Discontinued     2 g 100 mL/hr over 30 Minutes Intravenous Every 24 hours 06/03/17 0835 06/04/17 1341   05/29/17 1000  ceFEPIme (MAXIPIME) 1 g in dextrose 5 % 50 mL IVPB  Status:  Discontinued     1 g 100 mL/hr over 30 Minutes Intravenous Every 24 hours 05/29/17 0832 06/03/17 0835   05/28/17 1200  cefTRIAXone (ROCEPHIN) 2 g in dextrose 5 % 50 mL IVPB  Status:  Discontinued     2 g 100 mL/hr over 30 Minutes Intravenous Every 24 hours 05/28/17 0737 05/29/17 0832   05/26/17 1200  piperacillin-tazobactam (ZOSYN) IVPB 3.375 g  Status:  Discontinued     3.375 g 12.5 mL/hr over 240 Minutes Intravenous Every 8 hours 05/26/17 1042 05/28/17 0737   05/25/17 1000  piperacillin-tazobactam (ZOSYN) IVPB 2.25 g  Status:  Discontinued     2.25 g 100 mL/hr over 30 Minutes Intravenous Every 8 hours 05/25/17 0745 05/26/17 1042   05/24/17 1800  linezolid (ZYVOX) IVPB 600 mg  Status:  Discontinued     600 mg 300 mL/hr over 60 Minutes Intravenous Every 12 hours 05/24/17 1709 05/27/17 0817   05/24/17 1800  piperacillin-tazobactam (ZOSYN) IVPB 3.375 g  Status:  Discontinued     3.375 g 12.5 mL/hr over 240 Minutes Intravenous Every 8 hours 05/24/17 1713 05/25/17 0745   05/24/17 1715  piperacillin-tazobactam (ZOSYN) IVPB 3.375 g  Status:  Discontinued     3.375 g 100 mL/hr over 30 Minutes Intravenous Every 8 hours 05/24/17 1709 05/24/17 1712   05/20/17 2200  cefUROXime (ZINACEF) 1.5 g in dextrose 5 % 50 mL IVPB     1.5 g 100 mL/hr over 30 Minutes Intravenous Every 12 hours 05/20/17 1756 05/21/17 1034   05/20/17 0850  dextrose 5 % with cefUROXime (ZINACEF) ADS Med    Comments:  Ardine Eng   : cabinet override      05/20/17 0850 05/20/17 1033   05/20/17 0837  cefUROXime (ZINACEF) 1.5 g in dextrose 5 % 50 mL IVPB     1.5 g 100 mL/hr over 30 Minutes Intravenous 30 min pre-op 05/20/17 0837 05/20/17 [place], PA-C [facility] Office: [phone] Pager: [phone] 06/06/2017 9:19 AM

## 2017-06-06 NOTE — Progress Notes (Signed)
PROGRESS NOTE    Brooke Keller  GOT:[id number] DOB: [birth year] DOA: 05/20/2017 PCP: Curlene Labrum, MD   Brief Narrative:  82 year old WF PMHx Diabetes type 2 uncontrolled with complication, COPD on home 3 L O2 , CHF, HTN and IgG multiple myeloma    Patient underwent bypass surgery for non-healing ulcer of the ankle. Patient developed subsequent abdominal distention and ileus but refused NG tube placement. Patient became more altered and hypoxic overnight with nausea and vomiting. Patient had subsequent probable aspiration. Patient was noted to be in atrial fibrillation with rapid ventricular response and hypertensive. Metoprolol and Lasix were administered. Patient was transferred to the ICU and placed on noninvasive positive pressure ventilation. Patient's respiratory status remained marginal at best and ultimately she was endotracheally intubated.    Subjective: 8/23 sleepy but arousable, A/O 4, negative CP, baseline SOB, negative N/V. States setup for 5 hours in the chair.   Assessment & Plan:   Active Problems:   IgG multiple myeloma (HCC)   COPD (chronic obstructive pulmonary disease) (HCC)   Aortoiliac occlusive disease (HCC)   Dyspnea   Pressure injury of skin   Multifocal pneumonia   Acute respiratory failure with hypoxia/positive Klebsiella & Enterobacter Pneumonia/Aspiration Pneumona -Multifactorial pneumonia, pleural effusion, COPD, CHF? -Now on 2 L O2 via [place] (home regimen was 3 L O2) -PCXR from 8/20 shows continued moderate pleural effusionRt>>Lt : 8/23 PCXR, slight improvement see results below. May still require Thoracentesis. -DuoNeb TID -Flutter valve -Complete seven-day course antibiotics -Titrate O2 to maintain SPO2 89 - 92%   Cardiogenic Shock/Chronic diastolic CHF/Pulmonary HTN -Likely secondary to sepsis, adrenal insufficiency, CHF, iatrogenic -Echocardiogram: CHF and pulmonary hypertension see results below -Patient has been experiencing episodes of  hypotension most likely secondary to adrenal insufficiency -Goal MAP> 65 & SBP> 90   Hypotension  -See iatrogenic adrenal insufficiency -If severe bradycardia resolves with decrease in Amiodarone start Midodrin   PVD -S/P bypass   New onset Atrial fibrillation -Currently in NSR -8/23 decrease Amiodarone 200 mg daily -8/23 Sinus bradycardia Metoprolol IV discontinued -strict I&O since admission +9.3 L -Daily weight     Acute on CKD stage III (baseline Cr 1.04-1.  Last Labs     Recent Labs Lab 06/01/17 0300 06/02/17 0400 06/03/17 0316 06/04/17 0420 06/05/17 0329 06/06/17 0350  CREATININE 1.53*  1.51* 1.16* 0.89 0.83 0.79 0.76        Hypernatremia -D5W 30 ml/hr   Right non-healing ulcer over Right lateral malleolus  -S/P surgical repair on 8/7 by vascular surgery, see significant events -Continue care per surgery   Ileus: Resolved.     Leukocytosis:  -Improved  Resolved.   Pancytopenia:  -Multifactorial sepsis and Zyvox -No history IgG multiple myeloma: Multiple myeloma treatment on hold -PTA on chronic prednisone, on hold   Iatrogenic Adrenal Insufficiency -Patient not doing well with tapering of Hydrocortisone (multiple episodes hypotension). -Increase Hydrocortisone 50 mg IV TID   Diabetes Mellitus Type 2 controlled with renal complication:  -5/97 Hemoglobin A1c= 5.9   Delirium -Multifactorial postop pain, chronic benzodiazepine, chronic steroids, hypernatremia -Resolved -Minimize sedating medication   Hypokalemia -Potassium goal> 4   Hypomagnesemia -Magnesium goal> 2    DVT prophylaxis: Subcutaneous heparin Code Status: Full Family Communication: Husband and daughter present for discussion of plan of care. Discussed at length the seriousness of patient's illness. Disposition Plan: LTAC vs SNF   Consultants:  [facility] Vascular surgery  Procedures/Significant Events:  08/07 -Left common femoral artery exposure/Left femorofemoral  anastomosis as  part of Right axillobifemoral bypass 08/11 - Transferred to ICU w/ respiratory failure & atrial fibrillation >> failed BiPAP & was intubated CT CHEST W/O 8/11: - Dense consolidation of the right lower lobe/ patchy airspace opacities within the remainder of the right lung and left lower lobe, compatible with multifocal pneumonia. -An incidental finding of potential clinical significance has been found. Aneurysmal dilatation of the infrarenal abdominal aorta to 3.3 cm in AP dimension.  -1.2 cm nonspecific hypodensity at the right hepatic lobe. - Chronic compression deformities at T4, T5, T6, T7, T10 and L1. PORT ABD X-RAY 8/12:  Decreased small bowel dilatation since previous study. PORT CXR 8/12:  Right lower lobe opacity with chronic elevation of right hemidiaphragm. Somewhat lordotic and rotated film.  08/12 - Tolerated 8 hours of PS 12/5 weaning  08/14 - Bowel movements restarted PORT CXR 8/14:  -. Persistent patchy bilateral opacities, right predominant, relatively unchanged when compared with x-ray imaging from 8/12. -No new pleural effusion.  PORT CXR 8/15:  - slight worsening in bilateral pleural effusions versus atelectasis. 08/16 - Hypoglycemic to 64 overnight. 1amp of Bicarb given for acidosis. Increased WOB>>BiPAP. Normal Sinus Rhythm. PORT CXR 8/20:  Personally reviewed by me. Bilateral lower lung opacities & silhouetting of hemidiaphragm. Port and CVC unchanged.  -8/23 Echocardiogram:Left ventricle:  mild LVH. -LVEF = 65% to 70%.  -Grade  2 diastolic dysfunction - Left atrium: Mild to moderately dilated. -- Pulmonary arteries: PA peak pressure: 62 mm Hg (S). 8/23 PCXR: Bilateral pleural effusions, pulmonary venous congestion and bilateral interstitial prominence slightly improved. Pneumonia cannot be excluded    I have personally reviewed and interpreted all radiology studies and my findings are as above.  VENTILATOR SETTINGS:    Cultures MRSA PCR 7/25:   Positive  Blood Cultures x2 8/12:  Negative  Urine Culture 8/12:  Negative  Tracheal Aspirate Culture 8/12:  positive Moderate Klebsiella pneumoniae & Enterobacter cloacae      Antimicrobials: Anti-infectives    Start     Stop   06/03/17 1000  ceFEPIme (MAXIPIME) 2 g in dextrose 5 % 50 mL IVPB  Status:  Discontinued     06/04/17 1341   05/29/17 1000  ceFEPIme (MAXIPIME) 1 g in dextrose 5 % 50 mL IVPB  Status:  Discontinued     06/03/17 0835   05/28/17 1200  cefTRIAXone (ROCEPHIN) 2 g in dextrose 5 % 50 mL IVPB  Status:  Discontinued     05/29/17 0832   05/26/17 1200  piperacillin-tazobactam (ZOSYN) IVPB 3.375 g  Status:  Discontinued     05/28/17 0737   05/25/17 1000  piperacillin-tazobactam (ZOSYN) IVPB 2.25 g  Status:  Discontinued     05/26/17 1042   05/24/17 1800  linezolid (ZYVOX) IVPB 600 mg  Status:  Discontinued     05/27/17 0817   05/24/17 1800  piperacillin-tazobactam (ZOSYN) IVPB 3.375 g  Status:  Discontinued     05/25/17 0745   05/24/17 1715  piperacillin-tazobactam (ZOSYN) IVPB 3.375 g  Status:  Discontinued     05/24/17 1712   05/20/17 2200  cefUROXime (ZINACEF) 1.5 g in dextrose 5 % 50 mL IVPB     05/21/17 1034   05/20/17 0850  dextrose 5 % with cefUROXime (ZINACEF) ADS Med    Comments:  Ardine Eng   : cabinet override   05/20/17 1033   05/20/17 0837  cefUROXime (ZINACEF) 1.5 g in dextrose 5 % 50 mL IVPB     05/20/17 1043  Devices    LINES / TUBES:      Continuous Infusions: . dextrose 30 mL/hr at 06/05/17 2223  . magnesium sulfate 1 - 4 g bolus IVPB    . potassium chloride       Objective: Vitals:   06/06/17 0312 06/06/17 0400 06/06/17 0722 06/06/17 0800  BP: (!) 141/76 (!) 131/49  (!) 167/64  Pulse: (!) 52 (!) 48  (!) 56  Resp: _0 Temp: 97.8 F (36.6 C)   98.1 F (36.7 C)  TempSrc: Oral   Oral  SpO2: 98% 99% 100% 100%  Weight:      Height:        Intake/Output Summary (Last 24 hours) at 06/06/17 1135 Last data  filed at 06/06/17 0800  Gross per 24 hour  Intake             1090 ml  Output                0 ml  Net             1090 ml   Filed Weights   06/04/17 0404 06/05/17 0521 06/06/17 0030  Weight: 142 lb 8 oz (64.6 kg) 141 lb 4.8 oz (64.1 kg) 138 lb 3.2 oz (62.7 kg)    Examination:  General: Sleepy but arousable, A/O 4, positive chronic respiratory distress Neck:  Negative scars, masses, torticollis, lymphadenopathy, JVD Lungs: absent breath sounds bilateral Rt >> Lt , negative wheezing  Cardiovascular: Regular rate and rhythm without murmur gallop or rub normal S1 and S2 Abdomen: negative abdominal pain, nondistended, positive soft, bowel sounds, no rebound, no ascites, no appreciable mass Extremities: No significant cyanosis, clubbing. Bilateral edema 1+, bilateral feet wrapped in Ace bandages did not take down wrappings. Psychiatric:  Unable to fully evaluate  Central nervous system:  Cranial nerves II through XII intact, tongue/uvula midline, all extremities muscle strength 5/5, sensation intact throughout, negative dysarthria, negative expressive aphasia, negative receptive aphasia.  .     Data Reviewed: Care during the described time interval was provided by me .  I have reviewed this patient's available data, including medical history, events of note, physical examination, and all test results as part of my evaluation.   CBC:  Recent Labs Lab 06/02/17 0400 06/03/17 0316 06/04/17 0420 06/05/17 0329 06/06/17 0350  WBC 5.4 4.6 3.8* 4.6 4.1  NEUTROABS 4.0 3.5 3.0 3.6 3.5  HGB 9.0* 7.9* 7.9* 8.3* 7.8*  HCT 29.8* 26.6* 26.2* 27.9* 26.0*  MCV 91.7 93.7 93.6 93.6 93.2  PLT 154 132* 136* 126* 366*   Basic Metabolic Panel:  Recent Labs Lab 06/02/17 0400 06/03/17 0316 06/04/17 0420 06/05/17 0329 06/06/17 0350  NA 151* 148* 147* 145 144  K 3.0* 3.0* 2.8* 2.8* 3.6  CL 112* 111 112* 114* 110  CO2 _1 GLUCOSE 107* 112* 146* 109* 152*  BUN 24* _2 CREATININE 1.16* 0.89 0.83 0.79 0.76  CALCIUM 8.0* 7.9* 7.8* 7.2* 7.4*  MG 1.9 1.7 2.1 1.7 2.0  PHOS 2.1* 2.5 1.7* 3.0 2.9   GFR: Estimated Creatinine Clearance: 40.3 mL/min (by C-G formula based on SCr of 0.76 mg/dL). Liver Function Tests:  Recent Labs Lab 06/02/17 0400 06/03/17 0316 06/04/17 0420 06/05/17 0329 06/06/17 0350  ALBUMIN 2.4* 2.2* 2.2* 2.0* 2.0*   No results for input(s): LIPASE, AMYLASE in the last 168 hours. No results for input(s): AMMONIA in the last 168 hours. Coagulation Profile:  No results for input(s): INR, PROTIME in the last 168 hours. Cardiac Enzymes: No results for input(s): CKTOTAL, CKMB, CKMBINDEX, TROPONINI in the last 168 hours. BNP (last 3 results) No results for input(s): PROBNP in the last 8760 hours. HbA1C: No results for input(s): HGBA1C in the last 72 hours. CBG:  Recent Labs Lab 06/05/17 0614 06/05/17 1118 06/05/17 1619 06/05/17 2122 06/06/17 0611  GLUCAP 130* 182* 170* 143* 146*   Lipid Profile: No results for input(s): CHOL, HDL, LDLCALC, TRIG, CHOLHDL, LDLDIRECT in the last 72 hours. Thyroid Function Tests: No results for input(s): TSH, T4TOTAL, FREET4, T3FREE, THYROIDAB in the last 72 hours. Anemia Panel: No results for input(s): VITAMINB12, FOLATE, FERRITIN, TIBC, IRON, RETICCTPCT in the last 72 hours. Urine analysis:    Component Value Date/Time   COLORURINE YELLOW 05/25/2017 1510   APPEARANCEUR CLEAR 05/25/2017 1510   LABSPEC 1.013 05/25/2017 1510   PHURINE 5.0 05/25/2017 1510   GLUCOSEU 50 (A) 05/25/2017 1510   HGBUR MODERATE (A) 05/25/2017 1510   BILIRUBINUR NEGATIVE 05/25/2017 1510   KETONESUR NEGATIVE 05/25/2017 1510   PROTEINUR 30 (A) 05/25/2017 1510   UROBILINOGEN 1.0 10/01/2011 0820   NITRITE NEGATIVE 05/25/2017 1510   LEUKOCYTESUR NEGATIVE 05/25/2017 1510   Sepsis Labs: _0 (procalcitonin:4,lacticidven:4)  )No results found for this or any previous visit (from the past 240 hour(s)).        Radiology Studies: Dg Chest Port 1 View  Result Date: 06/05/2017 CLINICAL DATA:  Shortness of breath . EXAM: PORTABLE CHEST 1 VIEW COMPARISON:  06/02/2017 . FINDINGS: PowerPort catheter and left subclavian line noted in stable position. Prominent aortic atherosclerotic vascular calcification. Stable cardiomegaly. Stable cardiomegaly. Mild pulmonary venous congestion and bilateral interstitial prominence with bilateral pleural effusions suggesting CHF. Bibasilar pneumonia cannot be excluded. Stable pulmonary venous congestion and IMPRESSION: 1. PowerPort catheter and left subclavian line stable position. 2. Cardiomegaly with mild pulmonary venous congestion and bilateral interstitial prominence. Bilateral pleural effusions. Findings suggest mild CHF. Similar findings noted on prior exam . Bibasilar pneumonia cannot be excluded. Electronically Signed   By: [place]   On: 06/05/2017 07:34        Scheduled Meds: . amiodarone  200 mg Oral Daily  . aspirin  162 mg Per Tube Daily  . brimonidine  1 drop Both Eyes TID  . chlorhexidine  15 mL Mouth Rinse BID  . Chlorhexidine Gluconate Cloth  6 each Topical Daily  . collagenase   Topical BID  . dorzolamide-timolol  1 drop Both Eyes BID  . heparin subcutaneous  5,000 Units Subcutaneous Q8H  . hydrocortisone sodium succinate  50 mg Intravenous Q8H  . insulin aspart  0-15 Units Subcutaneous TID WC  . insulin aspart  0-5 Units Subcutaneous QHS  . ipratropium-albuterol  3 mL Nebulization TID  . latanoprost  1 drop Both Eyes QHS  . mouth rinse  15 mL Mouth Rinse q12n4p  . pantoprazole  40 mg Oral QHS  . phosphorus  500 mg Oral Daily  . [START ON 06/10/2017] predniSONE  10 mg Oral Q breakfast   Continuous Infusions: . dextrose 30 mL/hr at 06/05/17 2223  . magnesium sulfate 1 - 4 g bolus IVPB    . potassium chloride       LOS: 17 days    Time spent: 40 minutes    WOODS, Geraldo Docker, MD [facility] Pager [phone]    If 7PM-7AM, please contact night-coverage [email or web address] Password [facility] 06/06/2017, 11:35 AM

## 2017-06-06 NOTE — Progress Notes (Signed)
  Speech Language Pathology Treatment: Dysphagia  Patient Details Name: Brooke Keller MRN: [id number] DOB: [birth year] Today's Date: 06/06/2017 Time: 0149-9692 SLP Time Calculation (min) (ACUTE ONLY): 13 min  Assessment / Plan / Recommendation Clinical Impression  Alert, continues to appear stable, improving with daughter and grandson at bedside. Reviewed ST sessions thus far with plan and pt/family in agreement for trial of upgraded texture/liquid during this session. Masticated cheddar cheese daughter had without difficulty (endentulous) and nectar thick liquids without s/s aspiration. Recommend upgrade to Dys 2, nectar thick liquids, full supervision, try meds whole in applesauce. Continue treatment.    HPI HPI: 82 year old female admitted 05/20/17 for bypass surgery on ankle ulcer. Intubated for 5 days and extubated 8/15. PMH significant for COPD, chronic hypoxic respiratory failure, DM2, CHF, HTN, multiple myeloma. CXR = bilateral pleural effusions with underlying atelectasis, worsened on the left, pulmonary edema.      SLP Plan  Continue with current plan of care       Recommendations  Diet recommendations: Dysphagia 2 (fine chop);Nectar-thick liquid Liquids provided via: Cup;No straw Medication Administration: Whole meds with puree Supervision: Patient able to self feed;Full supervision/cueing for compensatory strategies Compensations: Minimize environmental distractions;Slow rate;Small sips/bites Postural Changes and/or Swallow Maneuvers: Seated upright 90 degrees;Upright 30-60 min after meal                Oral Care Recommendations: Oral care BID Follow up Recommendations: Skilled Nursing facility SLP Visit Diagnosis: Dysphagia, unspecified (R13.10) Plan: Continue with current plan of care       GO                Brooke Keller 06/06/2017, 2:36 PM  Brooke Keller.[employer] [phone]

## 2017-06-06 NOTE — Progress Notes (Signed)
PROGRESS NOTE    Brooke Keller  ERX:[id number] DOB: Feb 25, 1935 DOA: 05/20/2017 PCP: Curlene Labrum, MD   Brief Narrative:  82 year old WF PMHx Diabetes type 2 uncontrolled with complication, COPD on home 3 L O2 , CHF, HTN and IgG multiple myeloma    Patient underwent bypass surgery for non-healing ulcer of the ankle. Patient developed subsequent abdominal distention and ileus but refused NG tube placement. Patient became more altered and hypoxic overnight with nausea and vomiting. Patient had subsequent probable aspiration. Patient was noted to be in atrial fibrillation with rapid ventricular response and hypertensive. Metoprolol and Lasix were administered. Patient was transferred to the ICU and placed on noninvasive positive pressure ventilation. Patient's respiratory status remained marginal at best and ultimately she was endotracheally intubated.      Subjective: 8/24 A/O 4, negative CP, negative SOB, negative abdominal pain, positive nausea. Patient states has not been out of bed secondary to her nausea. Feels somewhat improved over 8/23 (much more awake and interactive).     Assessment & Plan:   Active Problems:   IgG multiple myeloma (HCC)   COPD (chronic obstructive pulmonary disease) (HCC)   Aortoiliac occlusive disease (HCC)   Dyspnea   Pressure injury of skin   Multifocal pneumonia   Acute respiratory failure with hypoxia/positive Klebsiella & Enterobacter Pneumonia/Aspiration Pneumona -Multifactorial pneumonia, pleural effusion, COPD, CHF? -Now on 2 L O2 via [place] (home regimen was 3 L O2) -PCXR from 8/20 shows continued moderate pleural effusionRt>>Lt : 8/23 PCXR, slight improvement see results below. May still require Thoracentesis. -DuoNeb TID -Flutter valve -Completed seven-day course antibiotics -Titrate O2 to maintain SPO2 89 - 92%   Cardiogenic Shock/Chronic diastolic CHF/Pulmonary HTN -Likely secondary to sepsis, adrenal insufficiency, CHF,  iatrogenic -Echocardiogram: CHF and pulmonary hypertension see results below -Appears patient hypotension episodes have resolved with decrease in BP medication and increase in stress dose steroids. -Restart home steroids (prednisone 10 mg daily) and titrate off stress dose steroids on 8/25 -Patient fluid overloaded by exam: If patient's BP and kidneys to increase over the weekend will attempt to gently diuresis vs auto diuresis. -Goal MAP> 65 & SBP> 90 -8/24 transfuse 1 unit PRBC   Hypotension  -See iatrogenic adrenal insufficiency -Continue bradycardia however hypotension has stabilized, see CHF   PVD -S/P bypass   New onset Atrial fibrillation -Currently in NSR - Amiodarone 200 mg daily -strict I&O since admission + 8.1 L -Daily weight Filed Weights   06/04/17 0404 06/05/17 0521 06/06/17 0030  Weight: 142 lb 8 oz (64.6 kg) 141 lb 4.8 oz (64.1 kg) 138 lb 3.2 oz (62.7 kg)    Acute on CKD stage III (baseline Cr 1.04-1.)   Recent Labs Lab 06/01/17 0300 06/02/17 0400 06/03/17 0316 06/04/17 0420 06/05/17 0329 06/06/17 0350  CREATININE 1.53*  1.51* 1.16* 0.89 0.83 0.79 0.76  -renal function baseline.  Hypernatremia -D5W to [facility]    Right non-healing ulcer over Right lateral malleolus  -S/P surgical repair on 8/7 by vascular surgery, see significant events -Continue care per surgery   Ileus: Resolved.     Leukocytosis:  -Improved  Resolved.   Pancytopenia:  -Multifactorial sepsis and Zyvox -No history IgG multiple myeloma: Multiple myeloma treatment on hold -Platelets slowly trending down. If on 8/25 trend continues will DC all heparin products -8/24 prednisone 10 mg restarted. Titrate stress dose steroids to off.    Iatrogenic Adrenal Insufficiency -Patient not doing well with tapering of Hydrocortisone (multiple episodes hypotension). -Hydrocortisone 50 mg IV TID (  be candidate to taper on 8/25)    Diabetes Mellitus Type 2 controlled with renal complication:   -6/60 Hemoglobin A1c= 5.9   Delirium -Multifactorial postop pain, chronic benzodiazepine, chronic steroids, hypernatremia -Resolved -Minimize sedating medication   Hypokalemia -Potassium goal> 4 -Potassium IV 50 mEq      Hypomagnesemia -Magnesium goal> 2   Severe protein calorie malnutrition -Continue to encourage patient to eat -Ensure TID between meals    DVT prophylaxis: Subcutaneous heparin Code Status: Full Family Communication: None Disposition Plan: SNF vs hospice   Consultants:  [facility] surgery PALLIATIVE CARE  Procedures/Significant Events:  08/07 -Left common femoral artery exposure/Left femorofemoral anastomosis as part of Right axillobifemoral bypass 08/11 - Transferred to ICU w/ respiratory failure & atrial fibrillation >> failed BiPAP & was intubated CT CHEST W/O 8/11: - Dense consolidation of the right lower lobe/ patchy airspace opacities within the remainder of the right lung and left lower lobe, compatible with multifocal pneumonia. -An incidental finding of potential clinical significance has been found. Aneurysmal dilatation of the infrarenal abdominal aorta to 3.3 cm in AP dimension.  -1.2 cm nonspecific hypodensity at the right hepatic lobe. - Chronic compression deformities at T4, T5, T6, T7, T10 and L1. PORT ABD X-RAY 8/12:  Decreased small bowel dilatation since previous study. PORT CXR 8/12:  Right lower lobe opacity with chronic elevation of right hemidiaphragm. Somewhat lordotic and rotated film.  08/12 - Tolerated 8 hours of PS 12/5 weaning  08/14 - Bowel movements restarted PORT CXR 8/14:  -. Persistent patchy bilateral opacities, right predominant, relatively unchanged when compared with x-ray imaging from 8/12. -No new pleural effusion.  PORT CXR 8/15:  - slight worsening in bilateral pleural effusions versus atelectasis. 08/16 - Hypoglycemic to 64 overnight. 1amp of Bicarb given for acidosis. Increased WOB>>BiPAP. Normal Sinus  Rhythm. PORT CXR 8/20:  Personally reviewed by me. Bilateral lower lung opacities & silhouetting of hemidiaphragm. Port and CVC unchanged.  -8/23 Echocardiogram:Left ventricle:  mild LVH. -LVEF = 65% to 70%.  -Grade  2 diastolic dysfunction - Left atrium: Mild to moderately dilated. -- Pulmonary arteries: PA peak pressure: 62 mm Hg (S). 8/23 PCXR: Bilateral pleural effusions, pulmonary venous congestion and bilateral interstitial prominence slightly improved. Pneumonia cannot be excluded    I have personally reviewed and interpreted all radiology studies and my findings are as above.  VENTILATOR SETTINGS:    Cultures MRSA PCR 7/25:  Positive  Blood Cultures x2 8/12:  Negative  Urine Culture 8/12:  Negative  Tracheal Aspirate Culture 8/12:  positive Moderate Klebsiella pneumoniae & Enterobacter cloacae     Antimicrobials: Anti-infectives    Start     Stop   06/03/17 1000  ceFEPIme (MAXIPIME) 2 g in dextrose 5 % 50 mL IVPB  Status:  Discontinued     06/04/17 1341   05/29/17 1000  ceFEPIme (MAXIPIME) 1 g in dextrose 5 % 50 mL IVPB  Status:  Discontinued     06/03/17 0835   05/28/17 1200  cefTRIAXone (ROCEPHIN) 2 g in dextrose 5 % 50 mL IVPB  Status:  Discontinued     05/29/17 0832   05/26/17 1200  piperacillin-tazobactam (ZOSYN) IVPB 3.375 g  Status:  Discontinued     05/28/17 0737   05/25/17 1000  piperacillin-tazobactam (ZOSYN) IVPB 2.25 g  Status:  Discontinued     05/26/17 1042   05/24/17 1800  linezolid (ZYVOX) IVPB 600 mg  Status:  Discontinued     05/27/17 0817   05/24/17 1800  piperacillin-tazobactam (ZOSYN) IVPB 3.375 g  Status:  Discontinued     05/25/17 0745   05/24/17 1715  piperacillin-tazobactam (ZOSYN) IVPB 3.375 g  Status:  Discontinued     05/24/17 1712   05/20/17 2200  cefUROXime (ZINACEF) 1.5 g in dextrose 5 % 50 mL IVPB     05/21/17 1034   05/20/17 0850  dextrose 5 % with cefUROXime (ZINACEF) ADS Med    Comments:  Ardine Eng   : cabinet override    05/20/17 1033   05/20/17 0837  cefUROXime (ZINACEF) 1.5 g in dextrose 5 % 50 mL IVPB     05/20/17 1043       Devices    LINES / TUBES:      Continuous Infusions: . dextrose 30 mL/hr at 06/05/17 2223  . magnesium sulfate 1 - 4 g bolus IVPB       Objective: Vitals:   06/06/17 0312 06/06/17 0400 06/06/17 0722 06/06/17 0800  BP: (!) 141/76 (!) 131/49  (!) 167/64  Pulse: (!) 52 (!) 48  (!) 56  Resp: 16 17  17   Temp: 97.8 F (36.6 C)   98.1 F (36.7 C)  TempSrc: Oral   Oral  SpO2: 98% 99% 100% 100%  Weight:      Height:        Intake/Output Summary (Last 24 hours) at 06/06/17 0923 Last data filed at 06/06/17 0655  Gross per 24 hour  Intake              970 ml  Output                0 ml  Net              970 ml   Filed Weights   06/04/17 0404 06/05/17 0521 06/06/17 0030  Weight: 142 lb 8 oz (64.6 kg) 141 lb 4.8 oz (64.1 kg) 138 lb 3.2 oz (62.7 kg)    Examination:  General: A/O 4, positive chronic respiratory distress(on 2 L O2 via [place], normally on 3 L O2 at home) Neck:  Negative scars, masses, torticollis, lymphadenopathy, JVD Lungs: absent breath sounds bilateral Rt >> Lt , negative wheezing  Cardiovascular: Regular rate and rhythm without murmur gallop or rub normal S1 and S2 Abdomen: negative abdominal pain, nondistended, positive soft, bowel sounds, no rebound, no ascites, no appreciable mass Extremities: No significant cyanosis, clubbing. Bilateral edema 2-3+ to hips, bilateral feet wrapped in Ace bandages did not take down wrappings. Psychiatric:  positive depression, negative anxiety, positive fatigue, negative mania   Central nervous system:  Cranial nerves II through XII intact, tongue/uvula midline, all extremities muscle strength 5/5, sensation intact throughout, negative dysarthria, negative expressive aphasia, negative receptive aphasia.  .     Data Reviewed: Care during the described time interval was provided by me .  I have reviewed this  patient's available data, including medical history, events of note, physical examination, and all test results as part of my evaluation.   CBC:  Recent Labs Lab 06/02/17 0400 06/03/17 0316 06/04/17 0420 06/05/17 0329 06/06/17 0350  WBC 5.4 4.6 3.8* 4.6 4.1  NEUTROABS 4.0 3.5 3.0 3.6 3.5  HGB 9.0* 7.9* 7.9* 8.3* 7.8*  HCT 29.8* 26.6* 26.2* 27.9* 26.0*  MCV 91.7 93.7 93.6 93.6 93.2  PLT 154 132* 136* 126* 161*   Basic Metabolic Panel:  Recent Labs Lab 06/02/17 0400 06/03/17 0316 06/04/17 0420 06/05/17 0329 06/06/17 0350  NA 151* 148* 147* 145 144  K 3.0* 3.0*  2.8* 2.8* 3.6  CL 112* 111 112* 114* 110  CO2 30 31 29 26 25   GLUCOSE 107* 112* 146* 109* 152*  BUN 24* 18 15 14 12   CREATININE 1.16* 0.89 0.83 0.79 0.76  CALCIUM 8.0* 7.9* 7.8* 7.2* 7.4*  MG 1.9 1.7 2.1 1.7 2.0  PHOS 2.1* 2.5 1.7* 3.0 2.9   GFR: Estimated Creatinine Clearance: 40.3 mL/min (by C-G formula based on SCr of 0.76 mg/dL). Liver Function Tests:  Recent Labs Lab 06/02/17 0400 06/03/17 0316 06/04/17 0420 06/05/17 0329 06/06/17 0350  ALBUMIN 2.4* 2.2* 2.2* 2.0* 2.0*   No results for input(s): LIPASE, AMYLASE in the last 168 hours. No results for input(s): AMMONIA in the last 168 hours. Coagulation Profile: No results for input(s): INR, PROTIME in the last 168 hours. Cardiac Enzymes: No results for input(s): CKTOTAL, CKMB, CKMBINDEX, TROPONINI in the last 168 hours. BNP (last 3 results) No results for input(s): PROBNP in the last 8760 hours. HbA1C: No results for input(s): HGBA1C in the last 72 hours. CBG:  Recent Labs Lab 06/05/17 0614 06/05/17 1118 06/05/17 1619 06/05/17 2122 06/06/17 0611  GLUCAP 130* 182* 170* 143* 146*   Lipid Profile: No results for input(s): CHOL, HDL, LDLCALC, TRIG, CHOLHDL, LDLDIRECT in the last 72 hours. Thyroid Function Tests: No results for input(s): TSH, T4TOTAL, FREET4, T3FREE, THYROIDAB in the last 72 hours. Anemia Panel: No results for input(s):  VITAMINB12, FOLATE, FERRITIN, TIBC, IRON, RETICCTPCT in the last 72 hours. Urine analysis:    Component Value Date/Time   COLORURINE YELLOW 05/25/2017 1510   APPEARANCEUR CLEAR 05/25/2017 1510   LABSPEC 1.013 05/25/2017 1510   PHURINE 5.0 05/25/2017 1510   GLUCOSEU 50 (A) 05/25/2017 1510   HGBUR MODERATE (A) 05/25/2017 1510   BILIRUBINUR NEGATIVE 05/25/2017 1510   KETONESUR NEGATIVE 05/25/2017 1510   PROTEINUR 30 (A) 05/25/2017 1510   UROBILINOGEN 1.0 10/01/2011 0820   NITRITE NEGATIVE 05/25/2017 1510   LEUKOCYTESUR NEGATIVE 05/25/2017 1510   Sepsis Labs: @LABRCNTIP (procalcitonin:4,lacticidven:4)  )No results found for this or any previous visit (from the past 240 hour(s)).       Radiology Studies: Dg Chest Port 1 View  Result Date: 06/05/2017 CLINICAL DATA:  Shortness of breath . EXAM: PORTABLE CHEST 1 VIEW COMPARISON:  06/02/2017 . FINDINGS: PowerPort catheter and left subclavian line noted in stable position. Prominent aortic atherosclerotic vascular calcification. Stable cardiomegaly. Stable cardiomegaly. Mild pulmonary venous congestion and bilateral interstitial prominence with bilateral pleural effusions suggesting CHF. Bibasilar pneumonia cannot be excluded. Stable pulmonary venous congestion and IMPRESSION: 1. PowerPort catheter and left subclavian line stable position. 2. Cardiomegaly with mild pulmonary venous congestion and bilateral interstitial prominence. Bilateral pleural effusions. Findings suggest mild CHF. Similar findings noted on prior exam . Bibasilar pneumonia cannot be excluded. Electronically Signed   By: Horine   On: 06/05/2017 07:34        Scheduled Meds: . amiodarone  200 mg Oral Daily  . aspirin  162 mg Per Tube Daily  . brimonidine  1 drop Both Eyes TID  . chlorhexidine  15 mL Mouth Rinse BID  . Chlorhexidine Gluconate Cloth  6 each Topical Daily  . collagenase   Topical BID  . dorzolamide-timolol  1 drop Both Eyes BID  . heparin  subcutaneous  5,000 Units Subcutaneous Q8H  . hydrocortisone sodium succinate  50 mg Intravenous Q8H  . insulin aspart  0-15 Units Subcutaneous TID WC  . insulin aspart  0-5 Units Subcutaneous QHS  . ipratropium-albuterol  3 mL Nebulization  TID  . latanoprost  1 drop Both Eyes QHS  . mouth rinse  15 mL Mouth Rinse q12n4p  . pantoprazole  40 mg Oral QHS  . phosphorus  500 mg Oral Daily  . [START ON 06/10/2017] predniSONE  10 mg Oral Q breakfast   Continuous Infusions: . dextrose 30 mL/hr at 06/05/17 2223  . magnesium sulfate 1 - 4 g bolus IVPB       LOS: 17 days    Time spent: 40 minutes    WOODS, Geraldo Docker, MD [facility] Pager [phone]   If 7PM-7AM, please contact night-coverage [email or web address] Password [facility] 06/06/2017, 9:23 AM

## 2017-06-07 ENCOUNTER — Inpatient Hospital Stay (HOSPITAL_COMMUNITY): Payer: Medicare Other

## 2017-06-07 DIAGNOSIS — J449 Chronic obstructive pulmonary disease, unspecified: Secondary | ICD-10-CM

## 2017-06-07 LAB — TYPE AND SCREEN
ABO/RH(D): A POS
Antibody Screen: NEGATIVE
UNIT DIVISION: 0

## 2017-06-07 LAB — RENAL FUNCTION PANEL
Albumin: 2.2 g/dL — ABNORMAL LOW (ref 3.5–5.0)
Anion gap: 6 (ref 5–15)
BUN: 15 mg/dL (ref 6–20)
CALCIUM: 7.3 mg/dL — AB (ref 8.9–10.3)
CHLORIDE: 110 mmol/L (ref 101–111)
CO2: 26 mmol/L (ref 22–32)
CREATININE: 0.82 mg/dL (ref 0.44–1.00)
Glucose, Bld: 184 mg/dL — ABNORMAL HIGH (ref 65–99)
PHOSPHORUS: 3 mg/dL (ref 2.5–4.6)
Potassium: 3.8 mmol/L (ref 3.5–5.1)
SODIUM: 142 mmol/L (ref 135–145)

## 2017-06-07 LAB — BPAM RBC
BLOOD PRODUCT EXPIRATION DATE: 201809182359
ISSUE DATE / TIME: 201808242231
UNIT TYPE AND RH: 6200

## 2017-06-07 LAB — CBC WITH DIFFERENTIAL/PLATELET
BASOS ABS: 0 10*3/uL (ref 0.0–0.1)
BASOS PCT: 0 %
EOS ABS: 0 10*3/uL (ref 0.0–0.7)
EOS PCT: 0 %
HCT: 30.4 % — ABNORMAL LOW (ref 36.0–46.0)
Hemoglobin: 9.3 g/dL — ABNORMAL LOW (ref 12.0–15.0)
Lymphocytes Relative: 8 %
Lymphs Abs: 0.3 10*3/uL — ABNORMAL LOW (ref 0.7–4.0)
MCH: 27.6 pg (ref 26.0–34.0)
MCHC: 30.6 g/dL (ref 30.0–36.0)
MCV: 90.2 fL (ref 78.0–100.0)
Monocytes Absolute: 0.4 10*3/uL (ref 0.1–1.0)
Monocytes Relative: 10 %
Neutro Abs: 3.4 10*3/uL (ref 1.7–7.7)
Neutrophils Relative %: 82 %
PLATELETS: 112 10*3/uL — AB (ref 150–400)
RBC: 3.37 MIL/uL — AB (ref 3.87–5.11)
RDW: 19.2 % — ABNORMAL HIGH (ref 11.5–15.5)
WBC: 4.1 10*3/uL (ref 4.0–10.5)

## 2017-06-07 LAB — GLUCOSE, CAPILLARY
GLUCOSE-CAPILLARY: 151 mg/dL — AB (ref 65–99)
GLUCOSE-CAPILLARY: 214 mg/dL — AB (ref 65–99)
GLUCOSE-CAPILLARY: 128 mg/dL — AB (ref 65–99)
Glucose-Capillary: 110 mg/dL — ABNORMAL HIGH (ref 65–99)

## 2017-06-07 LAB — MAGNESIUM: MAGNESIUM: 1.7 mg/dL (ref 1.7–2.4)

## 2017-06-07 MED ORDER — ALTEPLASE 2 MG IJ SOLR
2.0000 mg | Freq: Once | INTRAMUSCULAR | Status: AC
  Administered 2017-06-07: 2 mg
  Filled 2017-06-07: qty 2

## 2017-06-07 MED ORDER — HYDROCORTISONE NA SUCCINATE PF 100 MG IJ SOLR
25.0000 mg | Freq: Three times a day (TID) | INTRAMUSCULAR | Status: DC
  Administered 2017-06-07 – 2017-06-10 (×8): 25 mg via INTRAVENOUS
  Filled 2017-06-07 (×8): qty 2

## 2017-06-07 MED ORDER — MEGESTROL ACETATE 400 MG/10ML PO SUSP
800.0000 mg | Freq: Every day | ORAL | Status: DC
  Administered 2017-06-07 – 2017-06-10 (×4): 800 mg via ORAL
  Filled 2017-06-07 (×5): qty 20

## 2017-06-07 MED ORDER — MAGNESIUM SULFATE 50 % IJ SOLN: 1.0000 g | Freq: Once | INTRAMUSCULAR | Status: DC

## 2017-06-07 MED ORDER — ALPRAZOLAM 0.5 MG PO TABS
0.5000 mg | ORAL_TABLET | Freq: Three times a day (TID) | ORAL | Status: DC | PRN
  Administered 2017-06-08: 0.5 mg via ORAL
  Filled 2017-06-07: qty 1

## 2017-06-07 MED ORDER — MAGNESIUM SULFATE IN D5W 1-5 GM/100ML-% IV SOLN
1.0000 g | Freq: Once | INTRAVENOUS | Status: AC
  Administered 2017-06-07: 1 g via INTRAVENOUS
  Filled 2017-06-07: qty 100

## 2017-06-07 MED ORDER — FUROSEMIDE 10 MG/ML IJ SOLN
40.0000 mg | Freq: Once | INTRAMUSCULAR | Status: AC
  Administered 2017-06-07: 40 mg via INTRAVENOUS
  Filled 2017-06-07: qty 4

## 2017-06-07 NOTE — Progress Notes (Addendum)
[facility]  Subjective  - No changes, over all doing well.   Objective (!) 147/95 (!) 49 98 F (36.7 C) (Oral) 18 100%  Intake/Output Summary (Last 24 hours) at 06/07/17 0741 Last data filed at 06/07/17 0616  Gross per 24 hour  Intake             1455 ml  Output              200 ml  Net             1255 ml    Doppler DP B LE Lungs 2 L face mask O2 Right subclavian incision and groin incisions healing well Clean dry dressing on B heels  Assessment/Planning: 82 y.o. female is s/p: right axillobifemoral bypass 18 Days Post-Op  Respiratory status stable on 2L face mask O2. Has completed 7 day course of cefepime for Enterobacter and Klebsiella pneumonia. Wounds are clean. Continue daily wound care and dry dressing to groin incisions.  Continues on dysphagia 1 diet as recommended by SLP. Appreciate their assistance.  Hypokalemia resolved.  HGB 9.3 s/p 1 unit PRBC 06/06/2017 Pending planning for discharge after palliative care meeting likely not until Mon.   Laurence Slate [facility] 06/07/2017 7:41 AM --  Addendum  I have independently interviewed and examined the patient, and I agree with the physician assistant's findings.  Both ankles bandaged.  Both feet viable with intact weak motor.  R ax incision c/d/i.  Both groin incisions were not evaluated due to pannus.  I did not evaluate each groin per pt's preference.  Adele Barthel, MD, FACS [facility] Office: [phone] Pager: [phone]  06/07/2017, 9:34 AM   Laboratory Lab Results:  Recent Labs  06/06/17 0350 06/07/17 0439  WBC 4.1 4.1  HGB 7.8* 9.3*  HCT 26.0* 30.4*  PLT 111* 112*   BMET  Recent Labs  06/06/17 0350 06/07/17 0439  NA 144 142  K 3.6 3.8  CL 110 110  CO2 25 26  GLUCOSE 152* 184*  BUN 12 15  CREATININE 0.76 0.82  CALCIUM 7.4* 7.3*    COAG Lab Results  Component Value Date   INR 1.06 05/07/2017   INR 0.94  10/15/2012   INR 1.01 10/01/2011   No results found for: PTT

## 2017-06-07 NOTE — Progress Notes (Signed)
Pt is on her home trilogy NIV machine at this time. Tolerating it well no distress or complications noted.

## 2017-06-07 NOTE — Progress Notes (Signed)
Daily Progress Note   Patient Name: Brooke Keller       Date: 06/07/2017 DOB: 1935/05/18  Age: 82 y.o. MRN#: [id number] Attending Physician: Allie Bossier, MD Primary Care Physician: Curlene Labrum, MD Admit Date: 05/20/2017  Reason for Consultation/Follow-up: Establishing goals of care, Non pain symptom management and Psychosocial/spiritual support  Subjective: Met with patient's daughters this morning to review goals of care as follow-up to our conversation on 06/06/2017. Patient this morning appears very short of breath. She exhibits increased work of breathing, use of accessory muscles, and 1-2 word dyspnea. She is asking for a fan and asking that the room be made cooler and calling out to her daughters Patient given fentanyl 25 g with relief of acute shortness of breath. Patient was somnolent after injection but still awaken to voice. We did place to Trilogy mask on patient when she was asleep  Length of Stay: 18  Current Medications: Scheduled Meds:  . amiodarone  200 mg Oral Daily  . aspirin  162 mg Per Tube Daily  . brimonidine  1 drop Both Eyes TID  . chlorhexidine  15 mL Mouth Rinse BID  . Chlorhexidine Gluconate Cloth  6 each Topical Daily  . collagenase   Topical BID  . dorzolamide-timolol  1 drop Both Eyes BID  . feeding supplement (ENSURE ENLIVE)  237 mL Oral TID BM  . heparin subcutaneous  5,000 Units Subcutaneous Q8H  . hydrocortisone sodium succinate  50 mg Intravenous Q8H  . insulin aspart  0-15 Units Subcutaneous TID WC  . insulin aspart  0-5 Units Subcutaneous QHS  . ipratropium-albuterol  3 mL Nebulization BID  . latanoprost  1 drop Both Eyes QHS  . mouth rinse  15 mL Mouth Rinse q12n4p  . pantoprazole  40 mg Oral QHS  . phosphorus  500 mg Oral Daily  .  predniSONE  10 mg Oral Q breakfast    Continuous Infusions: . dextrose 10 mL/hr at 06/06/17 1925  . magnesium sulfate 1 - 4 g bolus IVPB      PRN Meds: acetaminophen **OR** acetaminophen, albuterol, ALPRAZolam, fentaNYL (SUBLIMAZE) injection, fluticasone, hydrALAZINE, labetalol, magnesium sulfate 1 - 4 g bolus IVPB, ondansetron, RESOURCE THICKENUP CLEAR  Physical Exam  Constitutional: She appears well-developed and well-nourished.  Acutely ill appearing female who appears to be in  respiratory distress  Pulmonary/Chest:  Increased work of breathing. 1-2 word dyspnea  Musculoskeletal: Normal range of motion.  Skin: Skin is warm and dry.  Psychiatric:  Anxious  Nursing note and vitals reviewed.           Vital Signs: BP 131/69 (BP Location: Right Arm)   Pulse (!) 54   Temp (!) 97.4 F (36.3 C) (Oral)   Resp 20   Ht 4' 7"  (1.397 m)   Wt 63.5 kg (140 lb)   SpO2 98%   BMI 32.54 kg/m  SpO2: SpO2: 98 % O2 Device: O2 Device: Nasal Cannula O2 Flow Rate: O2 Flow Rate (L/min): 2 L/min  Intake/output summary:  Intake/Output Summary (Last 24 hours) at 06/07/17 1533 Last data filed at 06/07/17 0935  Gross per 24 hour  Intake             1095 ml  Output              700 ml  Net              395 ml   LBM: Last BM Date: 06/06/17 Baseline Weight: Weight: 59.6 kg (131 lb 4.8 oz) Most recent weight: Weight: 63.5 kg (140 lb)       Palliative Assessment/Data:    Flowsheet Rows     Most Recent Value  Intake Tab  Referral Department  Hospitalist  Unit at Time of Referral  Med/Surg Unit  Palliative Care Primary Diagnosis  Pulmonary  Date Notified  06/06/17  Palliative Care Type  New Palliative care  Reason for referral  Clarify Goals of Care  Date of Admission  05/20/17  Date first seen by Palliative Care  06/06/17  # of days Palliative referral response time  0 Day(s)  # of days IP prior to Palliative referral  17  Clinical Assessment  Palliative Performance Scale Score   30%  Pain Max last 24 hours  Not able to report  Pain Min Last 24 hours  Not able to report  Dyspnea Max Last 24 Hours  Not able to report  Dyspnea Min Last 24 hours  Not able to report  Nausea Max Last 24 Hours  Not able to report  Nausea Min Last 24 Hours  Not able to report  Anxiety Max Last 24 Hours  Not able to report  Anxiety Min Last 24 Hours  Not able to report  Other Max Last 24 Hours  Not able to report  Psychosocial & Spiritual Assessment  Palliative Care Outcomes  Patient/Family meeting held?  Yes  Who was at the meeting?  pt, her 2 dtr's, 1 is Cassia  Provided psychosocial or spiritual support, Counseled regarding hospice  Palliative Care follow-up planned  Yes, Facility      Patient Active Problem List   Diagnosis Date Noted  . Palliative care by specialist   . Pressure injury of skin 06/04/2017  . Multifocal pneumonia 06/04/2017  . Dyspnea   . Aortoiliac occlusive disease ([place]) 05/20/2017  . PAD (peripheral artery disease) ([place]) 05/06/2017  . Pre-op testing 05/06/2017  . COPD (chronic obstructive pulmonary disease) ([place]) 09/17/2016  . Acute respiratory failure with hypoxia ([place]) 09/17/2016  . COPD with acute exacerbation ([place]) 09/17/2016  . Elevated troponin 09/17/2016  . IgG multiple myeloma ([place]) 04/14/2016  . Pain 01/25/2016  . Cancer associated pain 02/09/2015  . Port catheter in place 09/20/2013    Palliative Care Assessment & Plan   Patient Profile: 82 y.o.  female  with past medical history of Peripheral vascular disease postop 17 days for right axial bifemoral bypass, COPD on home O2 (hypoxic as well as hypercarbic respiratory failure; on Trilogy machine at home), diabetes, diastolic heart failure, multiple myeloma admitted on 05/20/2017 with plans for axillobifemoral bypass. Patient has had multiple complications and required intubation during this hospitalization. Patient has been ill for quite some time per her daughter's with  hypercarbic respiratory failure. Daughters share that they have been told going back 5 years that their mother only had a life expectancy of days to weeks (patient became acutely ill when she was initially diagnosed with multiple myeloma). They see a sharp decline in their mother's health over the past month particularly with this hospitalization status post surgery;  now with shortness of breath secondary to diastolic heart failure.. Patient has anasarca, with weeping edema, albumin 2.0  Consult requested for goals of care discussion. During this hospitalization patient's husband had to be hospitalized and he is currently in [facility]  Recommendations/Plan:  MOST form completed. Original is on patient's chart; daughters have a copy. She is now a DO NOT RESUSCITATE/DO NOT INTUBATE  Daughter's recognize how sick their mother is and would ideally like to continue to treat the treatable while she is here, maximize her clinical condition as much as we can, then discharge home with hospice of [facility]. They do not think that a rehabilitation/skilled nursing environment would be what their mother would want. In the past, patient's Trilogy machine has precluded her from taking hospice of [facility]. Discussed with Dr. Sherral Hammers this situation and how our department would like to see if further discussions with hospice of [facility] [place] could help facilitate her enrollment in the program. Specifically, if this Trilogy machine has a backup rate and  if this is their concern , if so if that can be deactivated. This was something that would have to be addressed through [place], as well as hospice of [facility] on Monday, 06/09/2017  Daughters are in favor of thoracentesis while she is in the hospital if this is indicated  Goals of Care and Additional Recommendations:  Limitations on Scope of Treatment: Avoid Hospitalization, Minimize Medications, Initiate Comfort Feeding, No  Artificial Feeding, No Chemotherapy, No Radiation and No Tracheostomy  Code Status:    Code Status Orders        Start     Ordered   06/07/17 0947  Do not attempt resuscitation (DNR)  Continuous    Question Answer Comment  In the event of cardiac or respiratory ARREST Do not call a "code blue"   In the event of cardiac or respiratory ARREST Do not perform Intubation, CPR, defibrillation or ACLS   In the event of cardiac or respiratory ARREST Use medication by any route, position, wound care, and other measures to relive pain and suffering. May use oxygen, suction and manual treatment of airway obstruction as needed for comfort.      06/07/17 0947    Code Status History    Date Active Date Inactive Code Status Order ID Comments User Context   05/20/2017  5:56 PM 06/07/2017  9:46 AM Full Code [id number]  [name], PA-C Inpatient   03/24/2017 10:37 AM 03/24/2017  5:15 PM Full Code [id number]  Angelia Mould, MD Inpatient   09/17/2016  5:24 PM 09/19/2016  6:16 PM Full Code [id number]  Isaac Bliss, Rayford Halsted, MD Inpatient    Advance Directive Documentation     Most  Recent Value  Type of Advance Directive  Living will  Pre-existing out of facility DNR order (yellow form or pink MOST form)  -  "MOST" Form in Place?  -       Prognosis:   < 6 months in the setting of severe peripheral vascular disease, end-stage COPD (hypoxic as well as hypercarbic respiratory failure) diastolic heart failure, multiple myeloma with acute functional decline over the past 30 days; protein calorie malnutrition with an albumin of 2.0. Patient is short of breath at rest    Discharge Planning:  Home with [place] was discussed with Dr. Sherral Hammers  Thank you for allowing the Palliative Medicine Team to assist in the care of this patient.   Time In: 0900 Time Out: 1000 Total Time 60 min Prolonged Time Billed  no       Greater than 50%  of this time was spent counseling and  coordinating care related to the above assessment and plan.  Dory Horn, NP  Please contact Palliative Medicine Team phone at [phone] for questions and concerns.

## 2017-06-07 NOTE — Progress Notes (Signed)
PROGRESS NOTE    Brooke Keller  OHY:[id number] DOB: 03-04-1935 DOA: 05/20/2017 PCP: Curlene Labrum, MD   Brief Narrative:  82 year old WF PMHxDiabetes type 2 uncontrolled with complication, COPD on home 3 L O2,CHF, HTN and IgG multiple myeloma   Patient underwent bypass surgery for non-healing ulcer of the ankle. Patient developed subsequent abdominal distention and ileus but refused NG tube placement. Patient became more altered and hypoxic overnight with nausea and vomiting. Patient had subsequent probable aspiration. Patient was noted to be in atrial fibrillation with rapid ventricular response and hypertensive. Metoprolol and Lasix were administered. Patient was transferred to the ICU and placed on noninvasive positive pressure ventilation. Patient's respiratory status remained marginal at best and ultimately she was endotracheally intubated.   Subjective: 8/25 A/O 4, patient very flat affect, withdrawn. Negative CP, chronic baseline SOB, negative N/V, negative abdominal pain. Appears patient has withdrawn (depressed?)     Assessment & Plan:   Active Problems:   IgG multiple myeloma (HCC)   COPD (chronic obstructive pulmonary disease) (HCC)   Aortoiliac occlusive disease (HCC)   Dyspnea   Pressure injury of skin   Multifocal pneumonia   Palliative care by specialist   Acute respiratory failure with hypoxia/positive Klebsiella & Enterobacter Pneumonia/Aspiration Pneumona -Multifactorial, pneumonia, pleural effusion bilateral, COPD, CHF.  -Now on 2 L O2 via [place] (home regimen was 3 L O2) -PCXR from 8/25 shows continued moderate pleural effusionRt>>Lt : 8/25 PCXR: Stable,  -DuoNeb TID -Flutter valve -Completed seven-day course antibiotics -Titrate O2 to maintain SPO2 89 - 92%   Cardiogenic Shock/Chronic diastolic CHF/Pulmonary HTN -Likely secondary to sepsis, adrenal insufficiency, CHF, iatrogenic -Echocardiogram: CHF and pulmonary hypertension see results  below -Appears patient hypotension episodes have resolved with decrease in BP medication and increase in stress dose steroids. -Home steroids (prednisone 10 mg daily), decrease stress dose steroids.  -Patient fluid overloaded by exam: Attempt to gently diuresis Lasix 40 mg 1/ vs auto diuresis. -Map goal>65 &SBP> 90 -8/24 transfuse 1 unit PRBC   Hypotension  -See iatrogenic adrenal insufficiency -Continue bradycardia however hypotension has stabilized, see CHF   PVD -S/P bypass   New onset Atrial fibrillation -Currently in NSR - Amiodarone 200 mg daily -strict I&O since admission +6.7  L -Daily weight Filed Weights   06/06/17 0030 06/07/17 0011 06/07/17 0420  Weight: 138 lb 3.2 oz (62.7 kg) 147 lb 3.2 oz (66.8 kg) 140 lb (63.5 kg)      Acute on CKD stage III (baseline Cr 1.04-1.)    Recent Labs Lab 06/02/17 0400 06/03/17 0316 06/04/17 0420 06/05/17 0329 06/06/17 0350 06/07/17 0439  CREATININE 1.16* 0.89 0.83 0.79 0.76 0.82  -At baseline   Hypernatremia -D5W to [facility]    Right non-healing ulcer over Right lateral malleolus  -S/P surgical repair on 8/7 by vascular surgery, see significant events -Continue care per surgery   Ileus: Resolved.     Leukocytosis:  -Improved  Resolved.   Pancytopenia:  -Multifactorial sepsis and Zyvox -No history IgG multiple myeloma: Multiple myeloma treatment on hold -Platelets stable continue to monitor signs of overt bleeding  -8/24 prednisone 10 mg restarted. Titrate stress dose steroids to off.    Iatrogenic Adrenal Insufficiency -8/25 decrease Hydrocortisone 25 mg IV TID   Diabetes Mellitus Type 2 controlled with renal complication:  -9/48 Hemoglobin A1c= 5.9   Delirium -Multifactorial postop pain, chronic benzodiazepine, chronic steroids, hypernatremia -Resolved -Minimize sedating medication   Hypokalemia -Potassium goal> 4   Hypomagnesemia -Magnesium goal> 2 -Magnesium IV  1 g     Severe protein calorie  malnutrition -Continue to encourage patient to eat -Ensure TID between meals -Megace 800 mg daily    DVT prophylaxis: Subcutaneous heparin  Code Status: DO NOT RESUSCITATE Family Communication: Whole family at bedside for discussion of plan care Disposition Plan: Per palliative care family most likely will take patient home on hospice on Monday.   Consultants:  [facility] Vascular surgery PALLIATIVE CARE  Procedures/Significant Events:  08/07 -Left common femoral artery exposure/Left femorofemoral anastomosis as part of Right axillobifemoral bypass 08/11 - Transferred to ICU w/ respiratory failure & atrial fibrillation >> failed BiPAP & was intubated CT CHEST W/O 8/11: - Dense consolidation of the right lower lobe/ patchy airspace opacities within the remainder of the right lung and left lower lobe, compatible with multifocal pneumonia. -An incidental finding of potential clinical significance has been found. Aneurysmal dilatation of the infrarenal abdominal aorta to 3.3 cm in AP dimension.  -1.2 cm nonspecific hypodensity at the right hepatic lobe. - Chronic compression deformities at T4, T5, T6, T7, T10 and L1. PORT ABD X-RAY 8/12:  Decreased small bowel dilatation since previous study. PORT CXR 8/12:  Right lower lobe opacity with chronic elevation of right hemidiaphragm. Somewhat lordotic and rotated film.  08/12 - Tolerated 8 hours of PS 12/5 weaning  08/14 - Bowel movements restarted PORT CXR 8/14:  -. Persistent patchy bilateral opacities, right predominant, relatively unchanged when compared with x-ray imaging from 8/12. -No new pleural effusion.  PORT CXR 8/15:  - slight worsening in bilateral pleural effusions versus atelectasis. 08/16 - Hypoglycemic to 64 overnight. 1amp of Bicarb given for acidosis. Increased WOB>>BiPAP. Normal Sinus Rhythm. PORT CXR 8/20:  Personally reviewed by me. Bilateral lower lung opacities & silhouetting of hemidiaphragm. Port and CVC unchanged.   -8/23 Echocardiogram:Left ventricle:  mild LVH. -LVEF = 65% to 70%.  -Grade  2 diastolic dysfunction - Left atrium: Mild to moderately dilated. -- Pulmonary arteries: PA peak pressure: 62 mm Hg (S). 8/23 PCXR: Bilateral pleural effusions, pulmonary venous congestion and bilateral interstitial prominence slightly improved. Pneumonia cannot be excluded 8/25 PCXR: No significant change from [facility] on 8/23   I have personally reviewed and interpreted all radiology studies and my findings are as above.  VENTILATOR SETTINGS: None   Cultures MRSA PCR 7/25:  Positive  Blood Cultures x2 8/12:  Negative  Urine Culture 8/12:  Negative  Tracheal Aspirate Culture 8/12:  positive Moderate Klebsiella pneumoniae & Enterobacter cloacae     Antimicrobials: Anti-infectives    Start     Stop   06/03/17 1000  ceFEPIme (MAXIPIME) 2 g in dextrose 5 % 50 mL IVPB  Status:  Discontinued     06/04/17 1341   05/29/17 1000  ceFEPIme (MAXIPIME) 1 g in dextrose 5 % 50 mL IVPB  Status:  Discontinued     06/03/17 0835   05/28/17 1200  cefTRIAXone (ROCEPHIN) 2 g in dextrose 5 % 50 mL IVPB  Status:  Discontinued     05/29/17 0832   05/26/17 1200  piperacillin-tazobactam (ZOSYN) IVPB 3.375 g  Status:  Discontinued     05/28/17 0737   05/25/17 1000  piperacillin-tazobactam (ZOSYN) IVPB 2.25 g  Status:  Discontinued     05/26/17 1042   05/24/17 1800  linezolid (ZYVOX) IVPB 600 mg  Status:  Discontinued     05/27/17 0817   05/24/17 1800  piperacillin-tazobactam (ZOSYN) IVPB 3.375 g  Status:  Discontinued     05/25/17 0745  05/24/17 1715  piperacillin-tazobactam (ZOSYN) IVPB 3.375 g  Status:  Discontinued     05/24/17 1712   05/20/17 2200  cefUROXime (ZINACEF) 1.5 g in dextrose 5 % 50 mL IVPB     05/21/17 1034   05/20/17 0850  dextrose 5 % with cefUROXime (ZINACEF) ADS Med    Comments:  Ardine Eng   : cabinet override   05/20/17 1033   05/20/17 0837  cefUROXime (ZINACEF) 1.5 g in dextrose 5 % 50 mL IVPB      05/20/17 1043       Devices    LINES / TUBES:      Continuous Infusions: . dextrose 10 mL/hr at 06/06/17 1925  . magnesium sulfate 1 - 4 g bolus IVPB       Objective: Vitals:   06/07/17 0349 06/07/17 0420 06/07/17 0802 06/07/17 0929  BP:  (!) 147/95  113/65  Pulse: (!) 48 (!) 49  85  Resp: _0 Temp:  98 F (36.7 C) 98.4 F (36.9 C)   TempSrc:  Oral Oral Oral  SpO2: 99% 100%  (!) 64%  Weight:  140 lb (63.5 kg)    Height:        Intake/Output Summary (Last 24 hours) at 06/07/17 0937 Last data filed at 06/07/17 0935  Gross per 24 hour  Intake             1275 ml  Output              700 ml  Net              575 ml   Filed Weights   06/06/17 0030 06/07/17 0011 06/07/17 0420  Weight: 138 lb 3.2 oz (62.7 kg) 147 lb 3.2 oz (66.8 kg) 140 lb (63.5 kg)    Examination:  General: A/O 4, positive chronic respiratory distress, cachectic  Neck:  Negative scars, masses, torticollis, lymphadenopathy, JVD Lungs: absent breath sounds by basilar,without wheezes or crackles Cardiovascular: Regular rate and rhythm without murmur gallop or rub normal S1 and S2 Abdomen: negative abdominal pain, nondistended, positive soft, bowel sounds, no rebound, no ascites, no appreciable mass Extremities: No significant cyanosis, clubbing. Plus bilateral pedal edema 2-3+, bilateral feet wrapped in Kerlix clean negative sign of infection Skin: Negative rashes, lesions, ulcers Psychiatric: Positive depression, flat affect, withdrawn, negative anxiety, negative fatigue, negative mania  Central nervous system:  Cranial nerves II through XII intact, tongue/uvula midline, all extremities muscle strength 5/5, sensation intact throughout, , negative dysarthria, negative expressive aphasia, negative receptive aphasia.  .     Data Reviewed: Care during the described time interval was provided by me .  I have reviewed this patient's available data, including medical history, events of  note, physical examination, and all test results as part of my evaluation.   CBC:  Recent Labs Lab 06/03/17 0316 06/04/17 0420 06/05/17 0329 06/06/17 0350 06/07/17 0439  WBC 4.6 3.8* 4.6 4.1 4.1  NEUTROABS 3.5 3.0 3.6 3.5 3.4  HGB 7.9* 7.9* 8.3* 7.8* 9.3*  HCT 26.6* 26.2* 27.9* 26.0* 30.4*  MCV 93.7 93.6 93.6 93.2 90.2  PLT 132* 136* 126* 111* 433*   Basic Metabolic Panel:  Recent Labs Lab 06/03/17 0316 06/04/17 0420 06/05/17 0329 06/06/17 0350 06/07/17 0439  NA 148* 147* 145 144 142  K 3.0* 2.8* 2.8* 3.6 3.8  CL 111 112* 114* 110 110  CO2 _1 GLUCOSE 112* 146* 109* 152* 184*  BUN 18 15  _0 CREATININE 0.89 0.83 0.79 0.76 0.82  CALCIUM 7.9* 7.8* 7.2* 7.4* 7.3*  MG 1.7 2.1 1.7 2.0 1.7  PHOS 2.5 1.7* 3.0 2.9 3.0   GFR: Estimated Creatinine Clearance: 39.6 mL/min (by C-G formula based on SCr of 0.82 mg/dL). Liver Function Tests:  Recent Labs Lab 06/03/17 0316 06/04/17 0420 06/05/17 0329 06/06/17 0350 06/07/17 0439  ALBUMIN 2.2* 2.2* 2.0* 2.0* 2.2*   No results for input(s): LIPASE, AMYLASE in the last 168 hours. No results for input(s): AMMONIA in the last 168 hours. Coagulation Profile: No results for input(s): INR, PROTIME in the last 168 hours. Cardiac Enzymes: No results for input(s): CKTOTAL, CKMB, CKMBINDEX, TROPONINI in the last 168 hours. BNP (last 3 results) No results for input(s): PROBNP in the last 8760 hours. HbA1C: No results for input(s): HGBA1C in the last 72 hours. CBG:  Recent Labs Lab 06/05/17 2122 06/06/17 0611 06/06/17 1209 06/06/17 2106 06/07/17 0604  GLUCAP 143* 146* 121* 167* 151*   Lipid Profile: No results for input(s): CHOL, HDL, LDLCALC, TRIG, CHOLHDL, LDLDIRECT in the last 72 hours. Thyroid Function Tests: No results for input(s): TSH, T4TOTAL, FREET4, T3FREE, THYROIDAB in the last 72 hours. Anemia Panel: No results for input(s): VITAMINB12, FOLATE, FERRITIN, TIBC, IRON, RETICCTPCT in the last 72  hours. Urine analysis:    Component Value Date/Time   COLORURINE YELLOW 05/25/2017 1510   APPEARANCEUR CLEAR 05/25/2017 1510   LABSPEC 1.013 05/25/2017 1510   PHURINE 5.0 05/25/2017 1510   GLUCOSEU 50 (A) 05/25/2017 1510   HGBUR MODERATE (A) 05/25/2017 1510   BILIRUBINUR NEGATIVE 05/25/2017 1510   KETONESUR NEGATIVE 05/25/2017 1510   PROTEINUR 30 (A) 05/25/2017 1510   UROBILINOGEN 1.0 10/01/2011 0820   NITRITE NEGATIVE 05/25/2017 1510   LEUKOCYTESUR NEGATIVE 05/25/2017 1510   Sepsis Labs: _1 (procalcitonin:4,lacticidven:4)  )No results found for this or any previous visit (from the past 240 hour(s)).       Radiology Studies: No results found.      Scheduled Meds: . amiodarone  200 mg Oral Daily  . aspirin  162 mg Per Tube Daily  . brimonidine  1 drop Both Eyes TID  . chlorhexidine  15 mL Mouth Rinse BID  . Chlorhexidine Gluconate Cloth  6 each Topical Daily  . collagenase   Topical BID  . dorzolamide-timolol  1 drop Both Eyes BID  . feeding supplement (ENSURE ENLIVE)  237 mL Oral TID BM  . heparin subcutaneous  5,000 Units Subcutaneous Q8H  . hydrocortisone sodium succinate  50 mg Intravenous Q8H  . insulin aspart  0-15 Units Subcutaneous TID WC  . insulin aspart  0-5 Units Subcutaneous QHS  . ipratropium-albuterol  3 mL Nebulization BID  . latanoprost  1 drop Both Eyes QHS  . mouth rinse  15 mL Mouth Rinse q12n4p  . pantoprazole  40 mg Oral QHS  . phosphorus  500 mg Oral Daily  . predniSONE  10 mg Oral Q breakfast   Continuous Infusions: . dextrose 10 mL/hr at 06/06/17 1925  . magnesium sulfate 1 - 4 g bolus IVPB       LOS: 18 days    Time spent: 40 minutes    Hines Kloss, Geraldo Docker, MD [facility] Pager [phone]   If 7PM-7AM, please contact night-coverage [email or web address] Password [facility] 06/07/2017, 9:37 AM

## 2017-06-08 DIAGNOSIS — D72828 Other elevated white blood cell count: Secondary | ICD-10-CM

## 2017-06-08 LAB — RENAL FUNCTION PANEL
ALBUMIN: 2.5 g/dL — AB (ref 3.5–5.0)
Anion gap: 7 (ref 5–15)
BUN: 14 mg/dL (ref 6–20)
CO2: 29 mmol/L (ref 22–32)
CREATININE: 0.77 mg/dL (ref 0.44–1.00)
Calcium: 7.3 mg/dL — ABNORMAL LOW (ref 8.9–10.3)
Chloride: 106 mmol/L (ref 101–111)
Glucose, Bld: 136 mg/dL — ABNORMAL HIGH (ref 65–99)
PHOSPHORUS: 4.3 mg/dL (ref 2.5–4.6)
POTASSIUM: 3.3 mmol/L — AB (ref 3.5–5.1)
Sodium: 142 mmol/L (ref 135–145)

## 2017-06-08 LAB — CBC WITH DIFFERENTIAL/PLATELET
BASOS ABS: 0 10*3/uL (ref 0.0–0.1)
BASOS PCT: 0 %
EOS PCT: 0 %
Eosinophils Absolute: 0 10*3/uL (ref 0.0–0.7)
HCT: 33.2 % — ABNORMAL LOW (ref 36.0–46.0)
Hemoglobin: 10.3 g/dL — ABNORMAL LOW (ref 12.0–15.0)
Lymphocytes Relative: 8 %
Lymphs Abs: 0.4 10*3/uL — ABNORMAL LOW (ref 0.7–4.0)
MCH: 28.5 pg (ref 26.0–34.0)
MCHC: 31 g/dL (ref 30.0–36.0)
MCV: 92 fL (ref 78.0–100.0)
MONO ABS: 0.5 10*3/uL (ref 0.1–1.0)
Monocytes Relative: 12 %
NEUTROS ABS: 3.6 10*3/uL (ref 1.7–7.7)
Neutrophils Relative %: 80 %
PLATELETS: 125 10*3/uL — AB (ref 150–400)
RBC: 3.61 MIL/uL — AB (ref 3.87–5.11)
RDW: 19 % — AB (ref 11.5–15.5)
WBC: 4.5 10*3/uL (ref 4.0–10.5)

## 2017-06-08 LAB — GLUCOSE, CAPILLARY
GLUCOSE-CAPILLARY: 207 mg/dL — AB (ref 65–99)
GLUCOSE-CAPILLARY: 224 mg/dL — AB (ref 65–99)
Glucose-Capillary: 104 mg/dL — ABNORMAL HIGH (ref 65–99)
Glucose-Capillary: 159 mg/dL — ABNORMAL HIGH (ref 65–99)

## 2017-06-08 LAB — MAGNESIUM: MAGNESIUM: 1.9 mg/dL (ref 1.7–2.4)

## 2017-06-08 MED ORDER — POTASSIUM CHLORIDE 20 MEQ/15ML (10%) PO SOLN
60.0000 meq | Freq: Once | ORAL | Status: AC
  Administered 2017-06-08: 60 meq via ORAL
  Filled 2017-06-08: qty 45

## 2017-06-08 MED ORDER — MAGNESIUM OXIDE 400 (241.3 MG) MG PO TABS
400.0000 mg | ORAL_TABLET | Freq: Once | ORAL | Status: AC
  Administered 2017-06-08: 400 mg via ORAL
  Filled 2017-06-08: qty 1

## 2017-06-08 MED ORDER — ALPRAZOLAM 0.5 MG PO TABS: 0.5000 mg | ORAL_TABLET | Freq: Two times a day (BID) | ORAL | Status: DC | PRN

## 2017-06-08 NOTE — Progress Notes (Signed)
Chaplain introduced herself to this patient and daughters who were at bedside.  Patient's spouse is a patient in another room which is how the Chaplain learned of this patient.  Chaplain arrived to provide prayer.  By this time the patient's spouse has been discharged and has also just arrived at the room.  Chaplain, spouse of the patient, patient and two daughters all prayed together concerning the care of the patient once she leaves the hospital.  Chaplain provided ministry of presence and encouragement for family.  Chaplain would like to thank the medical team for their care for this family.    06/08/17 1300  Clinical Encounter Type  Visited With Patient and family together  Visit Type Initial;Psychological support;Spiritual support;Social support

## 2017-06-08 NOTE — Progress Notes (Signed)
Patient ID: Brooke Keller, female   DOB: Oct 24, 1934, 82 y.o.   MRN: [id number] Met with patient's daughters and patient's husband this morning. Patient had an uneventful evening. Discussed discharge planning when patient is medically maximized Family's preference is to go home with hospice of [facility] but wished to maintain the Trilogy machine at this point. In the past this is been an obstacle to taking her hospice Medicare benefit  Palliative medicine office to pursue on Monday morning  direct conversation with hospice of [facility] regarding this case as well as [facility] in terms of the Tr[facility] machine, and whether it has a backup rate. If the machine does have a backup rate family is agreeable to disabling this feature if that's possible  If we are unable to facilitate admission to hospice, plan is for patient to be discharged home with home health. She does not wish to go for rehabilitation, and family is in agreement.  Palliative medicine representative to touch base with attending on 06/09/2017 Re: Out, conversations with rocking him [facility] as well as [facility].   Thank you for the consult, Romona Curls, ANP

## 2017-06-08 NOTE — Clinical Social Work Note (Signed)
Per Pallative note pt's family preference is to take pt home with hospice of [facility]. Clinical Social Worker will sign off for now as social work intervention is no longer needed. Please consult [place] again if new need arises.   Sylva, [place]

## 2017-06-08 NOTE — Progress Notes (Signed)
PROGRESS NOTE    Brooke Keller  DTO:[id number] DOB: [birth year] DOA: 05/20/2017 PCP: Curlene Labrum, MD   Brief Narrative:  82 year old WF PMHxDiabetes type 2 uncontrolled with complication, COPD on home 3 L O2,CHF, HTN and IgG multiple myeloma   Patient underwent bypass surgery for non-healing ulcer of the ankle. Patient developed subsequent abdominal distention and ileus but refused NG tube placement. Patient became more altered and hypoxic overnight with nausea and vomiting. Patient had subsequent probable aspiration. Patient was noted to be in atrial fibrillation with rapid ventricular response and hypertensive. Metoprolol and Lasix were administered. Patient was transferred to the ICU and placed on noninvasive positive pressure ventilation. Patient's respiratory status remained marginal at best and ultimately she was endotracheally intubated.   Subjective: 8/26 A/O 4, very flat affect, withdrawn, negative CP, positive chronic SOB (at baseline), negative N/V, negative abdominal pain.       Assessment & Plan:   Active Problems:   IgG multiple myeloma (HCC)   COPD (chronic obstructive pulmonary disease) (HCC)   Aortoiliac occlusive disease (HCC)   Dyspnea   Pressure injury of skin   Multifocal pneumonia   Palliative care by specialist   Acute respiratory failure with hypoxia/positive Klebsiella & Enterobacter Pneumonia bilateral lower lobe/Aspiration Pneumona -Multifactorial pneumonia, pleural effusion bilateral, COPD, CHF  -Now on 2 L O2 via [place] (home regimen 3 L O2)  -PCXR from 8/25 shows continued moderate pleural effusionRt>>Lt : 8/25 PCXR: Stable,  -DuoNeb TID -Flutter valve -Completed 7 day course antibiotics  -Titrate O2 to maintain SPO2 89 - 92%   Cardiogenic Shock/Chronic diastolic CHF/Pulmonary HTN -Secondary to sepsis, adrenal insufficiency, CHF, iatrogenic  -Echocardiogram: CHF and pulmonary hypertension see results below -Hypotensive episodes resolved  with decrease in BP medication and increase in stress dose steroids. -8/25 decrease Hydrocortisone 25 mg IV TID -Home steroids (prednisone 10 mg daily), decrease Solu-Medrol to 12.5 mg TID on 8/27 -Patient fluid overloaded by exam: Attempt to gently diuresis Lasix 40 mg 1/ vs auto diuresis. -Map goal>65 &SBP> 90 -Transfuse for hemoglobin<8 -8/24 transfuse 1 unit PRBC   Hypotension  -See iatrogenic adrenal insufficiency -Continue bradycardia, improving    PVD -S/P bypass   New onset Atrial fibrillation -Currently in NSR - Amiodarone 200 mg daily -strict I&O since admission + 2.0  L -Daily weight Filed Weights   06/07/17 0011 06/07/17 0420 06/08/17 0637  Weight: 147 lb 3.2 oz (66.8 kg) 140 lb (63.5 kg) 129 lb 6.4 oz (58.7 kg)      Acute on CKD stage III (baseline Cr 1.04-1.)    Recent Labs Lab 06/03/17 0316 06/04/17 0420 06/05/17 0329 06/06/17 0350 06/07/17 0439 06/08/17 0435  CREATININE 0.89 0.83 0.79 0.76 0.82 0.77  -At baseline   Hypernatremia -[facility] to [facility]    Right non-healing ulcer over Right lateral malleolus  -S/P surgical repair on 8/7 by vascular surgery, see significant events -Continue care per surgery   Ileus: Resolved.     Leukocytosis:  -Improved  Resolved.   Pancytopenia:  -Multifactorial sepsis and Zyvox -No history IgG multiple myeloma: Multiple myeloma treatment on hold -Platelets stable continue to monitor signs of overt bleeding    Iatrogenic Adrenal Insufficiency -See CHF  Diabetes Mellitus Type 2 controlled with renal complication:  -9/83 Hemoglobin A1c= 5.9 -Moderate SSI   Delirium -Multifactorial postop pain, chronic benzodiazepine, chronic steroids, hypernatremia -Resolved -Minimize sedating medication   Hypokalemia -Potassium goal> 4 -Potassium 60 mEq   Hypomagnesemia -Magnesium goal > 2 -Magnesium oxide 400 mg  Severe protein calorie malnutrition -Continue to encourage patient to eat -Ensure TID between  meals -Megace 800 mg daily  Goals of care -PALLIATIVE CARE: Have been following patient and family would like to take patient home on Monday with home hospice.    DVT prophylaxis: Subcutaneous heparin  Code Status: DO NOT RESUSCITATE Family Communication: Whole family at bedside for discussion of plan care Disposition Plan: Per palliative care family most likely will take patient home on hospice on Monday.   Consultants:  [facility] surgery PALLIATIVE CARE  Procedures/Significant Events:  08/07 -Left common femoral artery exposure/Left femorofemoral anastomosis as part of Right axillobifemoral bypass 08/11 - Transferred to ICU w/ respiratory failure & atrial fibrillation >> failed BiPAP & was intubated CT CHEST W/O 8/11: - Dense consolidation of the right lower lobe/ patchy airspace opacities within the remainder of the right lung and left lower lobe, compatible with multifocal pneumonia. -An incidental finding of potential clinical significance has been found. Aneurysmal dilatation of the infrarenal abdominal aorta to 3.3 cm in AP dimension.  -1.2 cm nonspecific hypodensity at the right hepatic lobe. - Chronic compression deformities at T4, T5, T6, T7, T10 and L1. PORT ABD X-RAY 8/12:  Decreased small bowel dilatation since previous study. PORT CXR 8/12:  Right lower lobe opacity with chronic elevation of right hemidiaphragm. Somewhat lordotic and rotated film.  08/12 - Tolerated 8 hours of PS 12/5 weaning  08/14 - Bowel movements restarted PORT CXR 8/14:  -. Persistent patchy bilateral opacities, right predominant, relatively unchanged when compared with x-ray imaging from 8/12. -No new pleural effusion.  PORT CXR 8/15:  - slight worsening in bilateral pleural effusions versus atelectasis. 08/16 - Hypoglycemic to 64 overnight. 1amp of Bicarb given for acidosis. Increased WOB>>BiPAP. Normal Sinus Rhythm. PORT CXR 8/20:  Personally reviewed by me. Bilateral lower lung opacities  & silhouetting of hemidiaphragm. Port and CVC unchanged.  -8/23 Echocardiogram:Left ventricle:  mild LVH. -LVEF = 65% to 70%.  -Grade  2 diastolic dysfunction - Left atrium: Mild to moderately dilated. -- Pulmonary arteries: PA peak pressure: 62 mm Hg (S). 8/23 PCXR: Bilateral pleural effusions, pulmonary venous congestion and bilateral interstitial prominence slightly improved. Pneumonia cannot be excluded 8/25 PCXR: No significant change from [facility] on 8/23   I have personally reviewed and interpreted all radiology studies and my findings are as above.  VENTILATOR SETTINGS: None   Cultures MRSA PCR 7/25:  Positive  Blood Cultures x2 8/12:  Negative  Urine Culture 8/12:  Negative  Tracheal Aspirate Culture 8/12:  positive Moderate Klebsiella pneumoniae & Enterobacter cloacae     Antimicrobials: Anti-infectives    Start     Stop   06/03/17 1000  ceFEPIme (MAXIPIME) 2 g in dextrose 5 % 50 mL IVPB  Status:  Discontinued     06/04/17 1341   05/29/17 1000  ceFEPIme (MAXIPIME) 1 g in dextrose 5 % 50 mL IVPB  Status:  Discontinued     06/03/17 0835   05/28/17 1200  cefTRIAXone (ROCEPHIN) 2 g in dextrose 5 % 50 mL IVPB  Status:  Discontinued     05/29/17 0832   05/26/17 1200  piperacillin-tazobactam (ZOSYN) IVPB 3.375 g  Status:  Discontinued     05/28/17 0737   05/25/17 1000  piperacillin-tazobactam (ZOSYN) IVPB 2.25 g  Status:  Discontinued     05/26/17 1042   05/24/17 1800  linezolid (ZYVOX) IVPB 600 mg  Status:  Discontinued     05/27/17 0817   05/24/17 1800  piperacillin-tazobactam (ZOSYN) IVPB 3.375 g  Status:  Discontinued     05/25/17 0745   05/24/17 1715  piperacillin-tazobactam (ZOSYN) IVPB 3.375 g  Status:  Discontinued     05/24/17 1712   05/20/17 2200  cefUROXime (ZINACEF) 1.5 g in dextrose 5 % 50 mL IVPB     05/21/17 1034   05/20/17 0850  dextrose 5 % with cefUROXime (ZINACEF) ADS Med    Comments:  Ardine Eng   : cabinet override   05/20/17 1033   05/20/17  0837  cefUROXime (ZINACEF) 1.5 g in dextrose 5 % 50 mL IVPB     05/20/17 1043       Devices    LINES / TUBES:      Continuous Infusions: . dextrose 10 mL/hr at 06/06/17 1925  . magnesium sulfate 1 - 4 g bolus IVPB       Objective: Vitals:   06/08/17 0637 06/08/17 0800 06/08/17 0943 06/08/17 1452  BP:  (!) 145/79    Pulse:  (!) 51  (!) 59  Resp:  15    Temp:    (!) 97 F (36.1 C)  TempSrc:    Axillary  SpO2:  100% 96%   Weight: 129 lb 6.4 oz (58.7 kg)     Height:        Intake/Output Summary (Last 24 hours) at 06/08/17 1705 Last data filed at 06/08/17 1100  Gross per 24 hour  Intake               80 ml  Output             2350 ml  Net            -2270 ml   Filed Weights   06/07/17 0011 06/07/17 0420 06/08/17 0637  Weight: 147 lb 3.2 oz (66.8 kg) 140 lb (63.5 kg) 129 lb 6.4 oz (58.7 kg)    Examination:  General: A/O 4, positive chronic respiratory distress, cachectic  Neck:  Negative scars, masses, torticollis, lymphadenopathy, JVD Lungs: absent breath sounds by basilar,without wheezes or crackles Cardiovascular: Regular rate and rhythm without murmur gallop or rub normal S1 and S2 Abdomen: negative abdominal pain, nondistended, positive soft, bowel sounds, no rebound, no ascites, no appreciable mass Extremities: No significant cyanosis, clubbing. Plus bilateral pedal edema 2-3+, bilateral feet wrapped in Kerlix clean negative sign of infection Skin: Negative rashes, lesions, ulcers Psychiatric: Positive depression, flat affect, withdrawn, negative anxiety, negative fatigue, negative mania  Central nervous system:  Cranial nerves II through XII intact, tongue/uvula midline, all extremities muscle strength 5/5, sensation intact throughout, , negative dysarthria, negative expressive aphasia, negative receptive aphasia.  .     Data Reviewed: Care during the described time interval was provided by me .  I have reviewed this patient's available data, including  medical history, events of note, physical examination, and all test results as part of my evaluation.   CBC:  Recent Labs Lab 06/04/17 0420 06/05/17 0329 06/06/17 0350 06/07/17 0439 06/08/17 0435  WBC 3.8* 4.6 4.1 4.1 4.5  NEUTROABS 3.0 3.6 3.5 3.4 3.6  HGB 7.9* 8.3* 7.8* 9.3* 10.3*  HCT 26.2* 27.9* 26.0* 30.4* 33.2*  MCV 93.6 93.6 93.2 90.2 92.0  PLT 136* 126* 111* 112* 301*   Basic Metabolic Panel:  Recent Labs Lab 06/04/17 0420 06/05/17 0329 06/06/17 0350 06/07/17 0439 06/08/17 0435  NA 147* 145 144 142 142  K 2.8* 2.8* 3.6 3.8 3.3*  CL 112* 114* 110 110 106  CO2 29  _0 GLUCOSE 146* 109* 152* 184* 136*  BUN _1 CREATININE 0.83 0.79 0.76 0.82 0.77  CALCIUM 7.8* 7.2* 7.4* 7.3* 7.3*  MG 2.1 1.7 2.0 1.7 1.9  PHOS 1.7* 3.0 2.9 3.0 4.3   GFR: Estimated Creatinine Clearance: 38.9 mL/min (by C-G formula based on SCr of 0.77 mg/dL). Liver Function Tests:  Recent Labs Lab 06/04/17 0420 06/05/17 0329 06/06/17 0350 06/07/17 0439 06/08/17 0435  ALBUMIN 2.2* 2.0* 2.0* 2.2* 2.5*   No results for input(s): LIPASE, AMYLASE in the last 168 hours. No results for input(s): AMMONIA in the last 168 hours. Coagulation Profile: No results for input(s): INR, PROTIME in the last 168 hours. Cardiac Enzymes: No results for input(s): CKTOTAL, CKMB, CKMBINDEX, TROPONINI in the last 168 hours. BNP (last 3 results) No results for input(s): PROBNP in the last 8760 hours. HbA1C: No results for input(s): HGBA1C in the last 72 hours. CBG:  Recent Labs Lab 06/07/17 1657 06/07/17 2021 06/08/17 0601 06/08/17 1127 06/08/17 1630  GLUCAP 128* 110* 104* 159* 207*   Lipid Profile: No results for input(s): CHOL, HDL, LDLCALC, TRIG, CHOLHDL, LDLDIRECT in the last 72 hours. Thyroid Function Tests: No results for input(s): TSH, T4TOTAL, FREET4, T3FREE, THYROIDAB in the last 72 hours. Anemia Panel: No results for input(s): VITAMINB12, FOLATE, FERRITIN, TIBC, IRON,  RETICCTPCT in the last 72 hours. Urine analysis:    Component Value Date/Time   COLORURINE YELLOW 05/25/2017 1510   APPEARANCEUR CLEAR 05/25/2017 1510   LABSPEC 1.013 05/25/2017 1510   PHURINE 5.0 05/25/2017 1510   GLUCOSEU 50 (A) 05/25/2017 1510   HGBUR MODERATE (A) 05/25/2017 1510   BILIRUBINUR NEGATIVE 05/25/2017 1510   KETONESUR NEGATIVE 05/25/2017 1510   PROTEINUR 30 (A) 05/25/2017 1510   UROBILINOGEN 1.0 10/01/2011 0820   NITRITE NEGATIVE 05/25/2017 1510   LEUKOCYTESUR NEGATIVE 05/25/2017 1510   Sepsis Labs: _2 (procalcitonin:4,lacticidven:4)  )No results found for this or any previous visit (from the past 240 hour(s)).       Radiology Studies: Dg Chest Port 1 View  Result Date: 06/07/2017 CLINICAL DATA:  Shortness of breath, increased work of breathing EXAM: PORTABLE CHEST 1 VIEW COMPARISON:  06/05/2017 FINDINGS: Low lung volumes. Possible mild interstitial edema. Mild patchy bilateral lower lobe opacities, possibly atelectasis, with associated small bilateral pleural effusions, left greater than right. No pneumothorax. Cardiomegaly. Right chest port terminates in the upper right atrium. Left subclavian venous catheter terminates in the mid SVC. IMPRESSION: Possible mild interstitial edema with bilateral pleural effusions, right greater than left. Mild patchy bilateral lower lobe opacities, likely atelectasis. Electronically Signed   By: Julian Hy M.D.   On: 06/07/2017 12:48        Scheduled Meds: . amiodarone  200 mg Oral Daily  . aspirin  162 mg Per Tube Daily  . brimonidine  1 drop Both Eyes TID  . chlorhexidine  15 mL Mouth Rinse BID  . Chlorhexidine Gluconate Cloth  6 each Topical Daily  . collagenase   Topical BID  . dorzolamide-timolol  1 drop Both Eyes BID  . feeding supplement (ENSURE ENLIVE)  237 mL Oral TID BM  . heparin subcutaneous  5,000 Units Subcutaneous Q8H  . hydrocortisone sodium succinate  25 mg Intravenous Q8H  . insulin aspart   0-15 Units Subcutaneous TID WC  . insulin aspart  0-5 Units Subcutaneous QHS  . ipratropium-albuterol  3 mL Nebulization BID  . latanoprost  1 drop Both Eyes QHS  . mouth rinse  15 mL Mouth Rinse q12n4p  . megestrol  800 mg Oral Daily  . pantoprazole  40 mg Oral QHS  . phosphorus  500 mg Oral Daily  . predniSONE  10 mg Oral Q breakfast   Continuous Infusions: . dextrose 10 mL/hr at 06/06/17 1925  . magnesium sulfate 1 - 4 g bolus IVPB       LOS: 19 days    Time spent: 40 minutes    Danny Zimny, Geraldo Docker, MD [facility] Pager [phone]   If 7PM-7AM, please contact night-coverage [email or web address] Password [email or web address] 06/08/2017, 5:05 PM

## 2017-06-09 ENCOUNTER — Telehealth: Payer: Self-pay | Admitting: Vascular Surgery

## 2017-06-09 DIAGNOSIS — I5033 Acute on chronic diastolic (congestive) heart failure: Secondary | ICD-10-CM

## 2017-06-09 LAB — RENAL FUNCTION PANEL
ALBUMIN: 2.3 g/dL — AB (ref 3.5–5.0)
ANION GAP: 6 (ref 5–15)
BUN: 16 mg/dL (ref 6–20)
CALCIUM: 7.6 mg/dL — AB (ref 8.9–10.3)
CO2: 29 mmol/L (ref 22–32)
Chloride: 106 mmol/L (ref 101–111)
Creatinine, Ser: 0.75 mg/dL (ref 0.44–1.00)
Glucose, Bld: 179 mg/dL — ABNORMAL HIGH (ref 65–99)
PHOSPHORUS: 2.8 mg/dL (ref 2.5–4.6)
POTASSIUM: 4.1 mmol/L (ref 3.5–5.1)
Sodium: 141 mmol/L (ref 135–145)

## 2017-06-09 LAB — GLUCOSE, CAPILLARY
GLUCOSE-CAPILLARY: 161 mg/dL — AB (ref 65–99)
GLUCOSE-CAPILLARY: 175 mg/dL — AB (ref 65–99)
GLUCOSE-CAPILLARY: 237 mg/dL — AB (ref 65–99)
Glucose-Capillary: 153 mg/dL — ABNORMAL HIGH (ref 65–99)

## 2017-06-09 LAB — CBC WITH DIFFERENTIAL/PLATELET
BASOS ABS: 0 10*3/uL (ref 0.0–0.1)
BASOS PCT: 0 %
Eosinophils Absolute: 0 10*3/uL (ref 0.0–0.7)
Eosinophils Relative: 0 %
HEMATOCRIT: 31.8 % — AB (ref 36.0–46.0)
HEMOGLOBIN: 9.7 g/dL — AB (ref 12.0–15.0)
Lymphocytes Relative: 11 %
Lymphs Abs: 0.5 10*3/uL — ABNORMAL LOW (ref 0.7–4.0)
MCH: 28 pg (ref 26.0–34.0)
MCHC: 30.5 g/dL (ref 30.0–36.0)
MCV: 91.9 fL (ref 78.0–100.0)
Monocytes Absolute: 0.2 10*3/uL (ref 0.1–1.0)
Monocytes Relative: 4 %
NEUTROS ABS: 3.5 10*3/uL (ref 1.7–7.7)
NEUTROS PCT: 85 %
Platelets: 122 10*3/uL — ABNORMAL LOW (ref 150–400)
RBC: 3.46 MIL/uL — AB (ref 3.87–5.11)
RDW: 18.3 % — AB (ref 11.5–15.5)
WBC: 4.1 10*3/uL (ref 4.0–10.5)

## 2017-06-09 LAB — MAGNESIUM: MAGNESIUM: 1.9 mg/dL (ref 1.7–2.4)

## 2017-06-09 MED ORDER — FUROSEMIDE 40 MG PO TABS
40.0000 mg | ORAL_TABLET | Freq: Every day | ORAL | Status: DC
  Administered 2017-06-09 – 2017-06-10 (×2): 40 mg via ORAL
  Filled 2017-06-09: qty 1

## 2017-06-09 NOTE — Progress Notes (Signed)
[facility]  Subjective  - No new complaints.    Objective 100/62 (!) 52 (!) 97.3 F (36.3 C) (Axillary) 16 98%  Intake/Output Summary (Last 24 hours) at 06/09/17 0742 Last data filed at 06/09/17 0500  Gross per 24 hour  Intake                0 ml  Output             1350 ml  Net            -1350 ml    Doppler DP B Heels dressed Palpable axillary graft pulse. Groins soft healing well Right subclavicular incision  Assessment/Planning: 82 y.o.femaleis s/p: right axillobifemoral bypass 20 Days Post-Op   K+ stable, Cr stable O2 3L [place] HGB declined, but stable 9.7 Pending discharge: Per Pallative note pt's family preference is to take pt home with hospice of [facility]. F/U with Dr. Scot Dock 2-3 weeks post discharge  Theda Sers Hurley 06/09/2017 7:42 AM --  Laboratory Lab Results:  Recent Labs  06/08/17 0435 06/09/17 0504  WBC 4.5 4.1  HGB 10.3* 9.7*  HCT 33.2* 31.8*  PLT 125* 122*   BMET  Recent Labs  06/08/17 0435 06/09/17 0504  NA 142 141  K 3.3* 4.1  CL 106 106  CO2 29 29  GLUCOSE 136* 179*  BUN 14 16  CREATININE 0.77 0.75  CALCIUM 7.3* 7.6*    COAG Lab Results  Component Value Date   INR 1.06 05/07/2017   INR 0.94 10/15/2012   INR 1.01 10/01/2011   No results found for: PTT

## 2017-06-09 NOTE — Care Management Note (Signed)
Case Management Note Marvetta Gibbons RN, BSN Unit 4E-Case Manager [phone]  Patient Details  Name: Brooke Keller MRN: [id number] Date of Birth: 03/22/1935  Subjective/Objective:   Pt admitted  s/p: Right axillobifemoral bypass.- has prevena wound vacs placed               Action/Plan: PTA pt lived at home with spouse- PT/OT evals ordered/pending- CM will follow for recommendations and d/c needs  Expected Discharge Date:                  Expected Discharge Plan:  Home w Hospice Care  In-House Referral:  Clinical Social Work  Discharge planning Services  CM Consult  Post Acute Care Choice:  Hospice Choice offered to:  Adult Children  DME Arranged:    DME Agency:     HH Arranged:  Disease Management Dresser Agency:  [facility]  Status of Service:  Completed, signed off  If discussed at [place] of Stay Meetings, dates discussed:  8/21, 8/23  Discharge Disposition: home with home hospice   Additional Comments:  06/09/17- 1200- Marvetta Gibbons RN, CM- spoke with Threasa Beards RN with PC this AM - plan is for pt to go home with Home Hospice- per [facility] she has faxed referral to [facility] which can take pt with [facility] for home hospice- call made to [facility] to f/u on referral- spoke with Oris Drone- who states that they have not received fax due to issues with their fax machine- gave this CM # for hospital leasoin- Kennyth Lose- call made to [facility] who states she is on her way to hospital to f/u on referral and get needed paperwork.  Update- [id number]- received call from Kennyth Lose- with [facility]- pt has been accepted into Hospice however due to staffing they can not admit pt tonight and would need to admit in the AM- have notified MD and plan will be to d/c pt in the AM via ambulance home with [facility] to see pt in the home tomorrow. Gold DNR on chart for d/c along with MOST form- CM will f/u in am for d/c arrangements and transportation home via [facility].   06/05/17- [place] RN, CM- per PT- recommendation for SNF- CSW following for possible placement- if pt progresses enough family would like to take pt home- CM to follow  Dahlia Client Romeo Rabon, RN 06/09/2017, 4:15 PM

## 2017-06-09 NOTE — Progress Notes (Signed)
PROGRESS NOTE    Brooke Keller  VQX:[id number] DOB: [birth year] DOA: 05/20/2017 PCP: Curlene Labrum, MD   Brief Narrative:  82 year old WF PMHxDiabetes type 2 uncontrolled with complication, COPD on home 3 L O2,CHF, HTN and IgG multiple myeloma   Patient underwent bypass surgery for non-healing ulcer of the ankle. Patient developed subsequent abdominal distention and ileus but refused NG tube placement. Patient became more altered and hypoxic overnight with nausea and vomiting. Patient had subsequent probable aspiration. Patient was noted to be in atrial fibrillation with rapid ventricular response and hypertensive. Metoprolol and Lasix were administered. Patient was transferred to the ICU and placed on noninvasive positive pressure ventilation. Patient's respiratory status remained marginal at best and ultimately she was endotracheally intubated. Now extubated, stable and having fluid overload slowly removed. Overall goal is to go home with hospice follow up   Subjective: AAOX3, no fever, no CP. Reports breathing is improved. No abd pain.    Assessment & Plan:   Active Problems:   IgG multiple myeloma (HCC)   COPD (chronic obstructive pulmonary disease) (HCC)   Aortoiliac occlusive disease (HCC)   Dyspnea   Pressure injury of skin   Multifocal pneumonia   Palliative care by specialist   Acute respiratory failure with hypoxia/positive Klebsiella & Enterobacter Pneumonia bilateral lower lobe/Aspiration Pneumonia/acute on chronic diastolic CHF -Multifactorial pneumonia, pleural effusion bilateral, COPD, CHF  -Now on 2-3 L O2 via [place] (home regimen 3 L O2)  -PCXR from 8/25 showed moderate pleural effusion R>>L  -continue DuoNeb TID -continue Flutter valve -patient has Completed 7 day course antibiotics; no fever and no major resp distress currently  -will continue O2 supplementation -lasix 40 mg daily started; follow daily weight; see below  -continue QHS trilogy machine      Cardiogenic Shock/Chronic diastolic CHF/Pulmonary HTN -Secondary to sepsis, diastolic CHF, iatrogenic fluid overload -Echocardiogram: CHF and pulmonary hypertension appreciated -no further hypotension  -continue hydrocortisone tapering  -patient on prednisone 39m daily -still with signs of fluid overload; but without SOB -Transfuse for hemoglobin<8 -will monitor volume status and electrolytes  -started on laxis 40 mg daily   PVD -S/P bypass   New onset Atrial fibrillation -HR stable and controlled now -telemetry reviewed and remains in SR -will continue use of amiodarone -follow and maintained electrolytes repleted.  Filed Weights   06/07/17 0420 06/08/17 0637 06/09/17 0334  Weight: 63.5 kg (140 lb) 58.7 kg (129 lb 6.4 oz) 59.3 kg (130 lb 11.2 oz)      Acute on CKD stage III (baseline Cr 1.04-1.)    Recent Labs Lab 06/04/17 0420 06/05/17 0329 06/06/17 0350 06/07/17 0439 06/08/17 0435 06/09/17 0504  CREATININE 0.83 0.79 0.76 0.82 0.77 0.75  -kidney function at baseline now -will monitor trend    Hypernatremia -resolved -will monitor electrolytes trend    Right non-healing ulcer over Right lateral malleolus  -S/P surgical repair on 8/7 by vascular surgery, see significant events -continue post operative care by vascular surgery -planning follow up with Dr. DScot Dockin 2-3 weeks    Ileus: Resolved. -patient actively passing flatus and with positive BM   Leukocytosis:  -resolved and WNL now   Anemia and thrombocytopenia:  -Multifactorial and suspected to be secondary to sepsis and Zyvox use -patient also with hx of MM; currently not on treatment  -no signs of acute bleeding  -Hgb 9.7; no transfusion needed    Diabetes Mellitus Type 2 controlled with renal complication:  -last Hemoglobin A1c 7/25= 5.9 -continue SSI  Delirium -Multifactorial postop pain, chronic benzodiazepine, chronic steroids, hypernatremia -mentation is stable -will continue  minimizing sedative drugs, while keeping her comfortable.   Hypokalemia -Potassium goal> 4 -stable; will continue daily supplementation    Hypomagnesemia -continue magnesium supplementation     Severe protein calorie malnutrition -continue megace and ensure -patient with poor appetite  Goals of care -appreciate assistance, care and input from palliative care -patient to go home with hospice  DVT prophylaxis: Subcutaneous heparin  Code Status: DO NOT RESUSCITATE Family Communication: grandson at bedside  Disposition Plan: plan is for discharge home with hospice on 06/10/17; hospice of [facility] will provide home equipment today and be able to received her/accept her tomorrow. Continue current care while in patient.    Consultants:  PCCM Vascular surgery PALLIATIVE CARE  Procedures/Significant Events:  08/07 -Left common femoral artery exposure/Left femorofemoral anastomosis as part of Right axillobifemoral bypass 08/11 - Transferred to ICU w/ respiratory failure & atrial fibrillation >> failed BiPAP & was intubated CT CHEST W/O 8/11: - Dense consolidation of the right lower lobe/ patchy airspace opacities within the remainder of the right lung and left lower lobe, compatible with multifocal pneumonia. -An incidental finding of potential clinical significance has been found. Aneurysmal dilatation of the infrarenal abdominal aorta to 3.3 cm in AP dimension.  -1.2 cm nonspecific hypodensity at the right hepatic lobe. - Chronic compression deformities at T4, T5, T6, T7, T10 and L1. PORT ABD X-RAY 8/12:  Decreased small bowel dilatation since previous study. PORT CXR 8/12:  Right lower lobe opacity with chronic elevation of right hemidiaphragm. Somewhat lordotic and rotated film.  08/12 - Tolerated 8 hours of PS 12/5 weaning  08/14 - Bowel movements restarted PORT CXR 8/14:  -. Persistent patchy bilateral opacities, right predominant, relatively unchanged when compared with x-ray  imaging from 8/12. -No new pleural effusion.  PORT CXR 8/15:  - slight worsening in bilateral pleural effusions versus atelectasis. 08/16 - Hypoglycemic to 64 overnight. 1amp of Bicarb given for acidosis. Increased WOB>>BiPAP. Normal Sinus Rhythm. PORT CXR 8/20:  Personally reviewed by me. Bilateral lower lung opacities & silhouetting of hemidiaphragm. Port and CVC unchanged.  -8/23 Echocardiogram:Left ventricle:  mild LVH. -LVEF = 65% to 70%.  -Grade  2 diastolic dysfunction - Left atrium: Mild to moderately dilated. -- Pulmonary arteries: PA peak pressure: 62 mm Hg (S). 8/23 PCXR: Bilateral pleural effusions, pulmonary venous congestion and bilateral interstitial prominence slightly improved. Pneumonia cannot be excluded 8/25 PCXR: No significant change from [place] on 8/23   Cultures MRSA PCR 7/25:  Positive  Blood Cultures x2 8/12:  Negative  Urine Culture 8/12:  Negative  Tracheal Aspirate Culture 8/12:  positive Moderate Klebsiella pneumoniae & Enterobacter cloacae     Antimicrobials: Anti-infectives    Start     Stop   06/03/17 1000  ceFEPIme (MAXIPIME) 2 g in dextrose 5 % 50 mL IVPB  Status:  Discontinued     06/04/17 1341   05/29/17 1000  ceFEPIme (MAXIPIME) 1 g in dextrose 5 % 50 mL IVPB  Status:  Discontinued     06/03/17 0835   05/28/17 1200  cefTRIAXone (ROCEPHIN) 2 g in dextrose 5 % 50 mL IVPB  Status:  Discontinued     05/29/17 0832   05/26/17 1200  piperacillin-tazobactam (ZOSYN) IVPB 3.375 g  Status:  Discontinued     05/28/17 0737   05/25/17 1000  piperacillin-tazobactam (ZOSYN) IVPB 2.25 g  Status:  Discontinued     05/26/17 1042   05/24/17 1800  linezolid (ZYVOX) IVPB 600 mg  Status:  Discontinued     05/27/17 0817   05/24/17 1800  piperacillin-tazobactam (ZOSYN) IVPB 3.375 g  Status:  Discontinued     05/25/17 0745   05/24/17 1715  piperacillin-tazobactam (ZOSYN) IVPB 3.375 g  Status:  Discontinued     05/24/17 1712   05/20/17 2200  cefUROXime (ZINACEF) 1.5 g  in dextrose 5 % 50 mL IVPB     05/21/17 1034   05/20/17 0850  dextrose 5 % with cefUROXime (ZINACEF) ADS Med    Comments:  Ardine Eng   : cabinet override   05/20/17 1033   05/20/17 0837  cefUROXime (ZINACEF) 1.5 g in dextrose 5 % 50 mL IVPB     05/20/17 1043      Continuous Infusions: . dextrose 10 mL/hr at 06/06/17 1925  . magnesium sulfate 1 - 4 g bolus IVPB      Objective: Vitals:   06/09/17 0744 06/09/17 0831 06/09/17 0832 06/09/17 1252  BP: 100/62  118/64 137/79  Pulse: (!) 54  85 (!) 58  Resp: (!) 24 (!) _0 Temp:      TempSrc:   Oral Oral  SpO2: 98%   99%  Weight:      Height:        Intake/Output Summary (Last 24 hours) at 06/09/17 1557 Last data filed at 06/09/17 0500  Gross per 24 hour  Intake                0 ml  Output              700 ml  Net             -700 ml   Filed Weights   06/07/17 0420 06/08/17 0637 06/09/17 0334  Weight: 63.5 kg (140 lb) 58.7 kg (129 lb 6.4 oz) 59.3 kg (130 lb 11.2 oz)    Examination:  General: AAOX3, no CP, no nausea, no vomiting. Slight work of breathing with activity, but reported breathing is stable overall. No fever.  Neck:  No JVD, no bruits, no thyromegaly  Lungs: scattered rhonchi, no wheezing, decrease BS at bases, no using accessory muscles. Cardiovascular: S1 and S2, no rubs, no gallops.  Abdomen: positive BS, no guarding, no tenderness Extremities: bilateral foot with clean dressing; positive pulses appreciated with dopplers; positive edema 1-2+ bilaterally; no cyanosis.  Psychiatric: flat affect, no hallucinations, no SI.  Central nervous system:  CN intact, no focal motor or neurologic deficit. Patient with decrease MS bilaterally and symmetrically due to deconditioning.  Data Reviewed: Care during the described time interval was provided by me .  I have reviewed this patient's available data, including medical history, events of note, physical examination, and all test results as part of my  evaluation.   CBC:  Recent Labs Lab 06/05/17 0329 06/06/17 0350 06/07/17 0439 06/08/17 0435 06/09/17 0504  WBC 4.6 4.1 4.1 4.5 4.1  NEUTROABS 3.6 3.5 3.4 3.6 3.5  HGB 8.3* 7.8* 9.3* 10.3* 9.7*  HCT 27.9* 26.0* 30.4* 33.2* 31.8*  MCV 93.6 93.2 90.2 92.0 91.9  PLT 126* 111* 112* 125* 924*   Basic Metabolic Panel:  Recent Labs Lab 06/05/17 0329 06/06/17 0350 06/07/17 0439 06/08/17 0435 06/09/17 0504  NA 145 144 142 142 141  K 2.8* 3.6 3.8 3.3* 4.1  CL 114* 110 110 106 106  CO2 _1 GLUCOSE 109* 152* 184* 136* 179*  BUN _2 16  CREATININE 0.79 0.76 0.82 0.77 0.75  CALCIUM 7.2* 7.4* 7.3* 7.3* 7.6*  MG 1.7 2.0 1.7 1.9 1.9  PHOS 3.0 2.9 3.0 4.3 2.8   GFR: Estimated Creatinine Clearance: 39 mL/min (by C-G formula based on SCr of 0.75 mg/dL).   Liver Function Tests:  Recent Labs Lab 06/05/17 0329 06/06/17 0350 06/07/17 0439 06/08/17 0435 06/09/17 0504  ALBUMIN 2.0* 2.0* 2.2* 2.5* 2.3*   CBG:  Recent Labs Lab 06/08/17 1127 06/08/17 1630 06/08/17 2105 06/09/17 0613 06/09/17 1124  GLUCAP 159* 207* 224* 153* 161*   Urine analysis:    Component Value Date/Time   COLORURINE YELLOW 05/25/2017 1510   APPEARANCEUR CLEAR 05/25/2017 1510   LABSPEC 1.013 05/25/2017 1510   PHURINE 5.0 05/25/2017 1510   GLUCOSEU 50 (A) 05/25/2017 1510   HGBUR MODERATE (A) 05/25/2017 1510   BILIRUBINUR NEGATIVE 05/25/2017 1510   KETONESUR NEGATIVE 05/25/2017 1510   PROTEINUR 30 (A) 05/25/2017 1510   UROBILINOGEN 1.0 10/01/2011 0820   NITRITE NEGATIVE 05/25/2017 1510   LEUKOCYTESUR NEGATIVE 05/25/2017 1510    Radiology Studies: No results found.    Scheduled Meds: . amiodarone  200 mg Oral Daily  . aspirin  162 mg Per Tube Daily  . brimonidine  1 drop Both Eyes TID  . chlorhexidine  15 mL Mouth Rinse BID  . Chlorhexidine Gluconate Cloth  6 each Topical Daily  . collagenase   Topical BID  . dorzolamide-timolol  1 drop Both Eyes BID  . feeding  supplement (ENSURE ENLIVE)  237 mL Oral TID BM  . heparin subcutaneous  5,000 Units Subcutaneous Q8H  . hydrocortisone sodium succinate  25 mg Intravenous Q8H  . insulin aspart  0-15 Units Subcutaneous TID WC  . insulin aspart  0-5 Units Subcutaneous QHS  . ipratropium-albuterol  3 mL Nebulization BID  . latanoprost  1 drop Both Eyes QHS  . mouth rinse  15 mL Mouth Rinse q12n4p  . megestrol  800 mg Oral Daily  . pantoprazole  40 mg Oral QHS  . phosphorus  500 mg Oral Daily  . predniSONE  10 mg Oral Q breakfast   Continuous Infusions: . dextrose 10 mL/hr at 06/06/17 1925  . magnesium sulfate 1 - 4 g bolus IVPB       LOS: 20 days    Time spent: 25 minutes    Barton Dubois, MD [facility] Pager [phone]   If 7PM-7AM, please contact night-coverage [email or web address] Password [facility] 06/09/2017, 3:57 PM

## 2017-06-09 NOTE — Telephone Encounter (Signed)
Sched appt 07/09/17 at 9:15. Lm on hm#.

## 2017-06-09 NOTE — Progress Notes (Signed)
Physical Therapy Discharge Patient Details Name: Brooke Keller MRN: [id number] DOB: [birth year] Today's Date: 06/09/2017 Time:  -     Patient discharged from PT services secondary to Pt is being discharged home today with hospice and requests no further PT survices..  Please see latest therapy progress note for current level of functioning and progress toward goals.    Progress and discharge plan discussed with patient and/or caregiver: Patient/Caregiver agrees with plan  GP Dani Gobble. Migdalia Dk PT, DPT Acute Rehabilitation  [phone] Pager [phone]      [place] 06/09/2017, 11:03 AM

## 2017-06-09 NOTE — Progress Notes (Signed)
Palliative Medicine RN Note:  Follow up from weekend discussions with PMT NP Romona Curls and family. Pt will be going home on Trilogy. [facility] cannot take pt on Trilogy, but [facility] will [phone]).   Spoke with RNCM to update on hospice referral. I printed and faxed referral (facesheet, notes, labs, MAR, order). Called daughter Lenna Sciara; no answer, no VM. Notes indicate that if she is unable to be reached we can talk to her sister Lattie Haw or the pt's husband, as they all make decisions together. Rec'd call back from daughter Lattie Haw [phone]); let her know that we found a hospice who can help them and that we are setting it up. She asked about how pt will get home; pt is very fatigued and likely unable to sit long enough for the trip home & will need ambulance.   Hospice will need d/c orders faxed to them at [phone].  Marjie Skiff Ejay Lashley, RN, BSN, [facility] 06/09/2017 11:07 AM Cell [phone] 8:00-4:00 Monday-Friday Office [phone]

## 2017-06-09 NOTE — Telephone Encounter (Signed)
-----   Message from Mena Goes, RN sent at 06/09/2017  8:14 AM EDT ----- Regarding: 2-3 weeks   ----- Message ----- From: Ulyses Amor, PA-C Sent: 06/09/2017   7:48 AM To: Vvs Charge Pool  S/P right ax femoral bypass Dr. Scot Dock 2-3 weeks

## 2017-06-10 DIAGNOSIS — I4891 Unspecified atrial fibrillation: Secondary | ICD-10-CM

## 2017-06-10 DIAGNOSIS — E43 Unspecified severe protein-calorie malnutrition: Secondary | ICD-10-CM

## 2017-06-10 DIAGNOSIS — R570 Cardiogenic shock: Secondary | ICD-10-CM

## 2017-06-10 DIAGNOSIS — E1121 Type 2 diabetes mellitus with diabetic nephropathy: Secondary | ICD-10-CM

## 2017-06-10 DIAGNOSIS — J150 Pneumonia due to Klebsiella pneumoniae: Secondary | ICD-10-CM

## 2017-06-10 DIAGNOSIS — I272 Pulmonary hypertension, unspecified: Secondary | ICD-10-CM

## 2017-06-10 DIAGNOSIS — N179 Acute kidney failure, unspecified: Secondary | ICD-10-CM

## 2017-06-10 DIAGNOSIS — I959 Hypotension, unspecified: Secondary | ICD-10-CM

## 2017-06-10 DIAGNOSIS — E876 Hypokalemia: Secondary | ICD-10-CM

## 2017-06-10 DIAGNOSIS — E870 Hyperosmolality and hypernatremia: Secondary | ICD-10-CM

## 2017-06-10 DIAGNOSIS — J690 Pneumonitis due to inhalation of food and vomit: Secondary | ICD-10-CM

## 2017-06-10 DIAGNOSIS — N183 Chronic kidney disease, stage 3 (moderate): Secondary | ICD-10-CM

## 2017-06-10 DIAGNOSIS — E2740 Unspecified adrenocortical insufficiency: Secondary | ICD-10-CM

## 2017-06-10 DIAGNOSIS — J9601 Acute respiratory failure with hypoxia: Secondary | ICD-10-CM

## 2017-06-10 DIAGNOSIS — I9589 Other hypotension: Secondary | ICD-10-CM

## 2017-06-10 LAB — CBC WITH DIFFERENTIAL/PLATELET
Basophils Absolute: 0 10*3/uL (ref 0.0–0.1)
Basophils Relative: 0 %
EOS PCT: 0 %
Eosinophils Absolute: 0 10*3/uL (ref 0.0–0.7)
HEMATOCRIT: 32.7 % — AB (ref 36.0–46.0)
Hemoglobin: 9.8 g/dL — ABNORMAL LOW (ref 12.0–15.0)
LYMPHS ABS: 0.3 10*3/uL — AB (ref 0.7–4.0)
LYMPHS PCT: 6 %
MCH: 27.8 pg (ref 26.0–34.0)
MCHC: 30 g/dL (ref 30.0–36.0)
MCV: 92.9 fL (ref 78.0–100.0)
MONO ABS: 0.4 10*3/uL (ref 0.1–1.0)
MONOS PCT: 9 %
NEUTROS ABS: 4.1 10*3/uL (ref 1.7–7.7)
Neutrophils Relative %: 85 %
PLATELETS: 123 10*3/uL — AB (ref 150–400)
RBC: 3.52 MIL/uL — ABNORMAL LOW (ref 3.87–5.11)
RDW: 18.3 % — AB (ref 11.5–15.5)
WBC: 4.8 10*3/uL (ref 4.0–10.5)

## 2017-06-10 LAB — RENAL FUNCTION PANEL
ALBUMIN: 2.4 g/dL — AB (ref 3.5–5.0)
ANION GAP: 7 (ref 5–15)
BUN: 14 mg/dL (ref 6–20)
CHLORIDE: 105 mmol/L (ref 101–111)
CO2: 30 mmol/L (ref 22–32)
Calcium: 7.7 mg/dL — ABNORMAL LOW (ref 8.9–10.3)
Creatinine, Ser: 0.72 mg/dL (ref 0.44–1.00)
GFR calc Af Amer: >60 mL/min (ref >60)
GFR calc non Af Amer: >60 mL/min (ref >60)
GLUCOSE: 152 mg/dL — AB (ref 65–99)
PHOSPHORUS: 2.3 mg/dL — AB (ref 2.5–4.6)
POTASSIUM: 3.9 mmol/L (ref 3.5–5.1)
Sodium: 142 mmol/L (ref 135–145)

## 2017-06-10 LAB — GLUCOSE, CAPILLARY
GLUCOSE-CAPILLARY: 241 mg/dL — AB (ref 65–99)
Glucose-Capillary: 166 mg/dL — ABNORMAL HIGH (ref 65–99)

## 2017-06-10 LAB — MAGNESIUM: Magnesium: 1.8 mg/dL (ref 1.7–2.4)

## 2017-06-10 MED ORDER — COLLAGENASE 250 UNIT/GM EX OINT: TOPICAL_OINTMENT | Freq: Two times a day (BID) | CUTANEOUS | 0 refills | Status: AC

## 2017-06-10 MED ORDER — ALPRAZOLAM 0.5 MG PO TABS: 0.5000 mg | ORAL_TABLET | Freq: Two times a day (BID) | ORAL | 0 refills | Status: AC | PRN

## 2017-06-10 MED ORDER — PANTOPRAZOLE SODIUM 40 MG PO TBEC: 40.0000 mg | DELAYED_RELEASE_TABLET | Freq: Every day | ORAL | 0 refills | Status: AC

## 2017-06-10 MED ORDER — ENSURE ENLIVE PO LIQD: 237.0000 mL | Freq: Three times a day (TID) | ORAL | 0 refills | Status: AC

## 2017-06-10 MED ORDER — AMIODARONE HCL 200 MG PO TABS: 200.0000 mg | ORAL_TABLET | Freq: Every day | ORAL | 0 refills | Status: AC

## 2017-06-10 MED ORDER — MEGESTROL ACETATE 400 MG/10ML PO SUSP: 800.0000 mg | Freq: Every day | ORAL | 0 refills | Status: AC

## 2017-06-10 MED ORDER — ALBUTEROL SULFATE (2.5 MG/3ML) 0.083% IN NEBU: 2.5000 mg | INHALATION_SOLUTION | RESPIRATORY_TRACT | 0 refills | Status: AC | PRN

## 2017-06-10 NOTE — Progress Notes (Signed)
  Speech Language Pathology Treatment: Dysphagia  Patient Details Name: Brooke Keller MRN: [id number] DOB: 1934-11-06 Today's Date: 06/10/2017 Time: 7949-9718 SLP Time Calculation (min) (ACUTE ONLY): 8 min  Assessment / Plan / Recommendation Clinical Impression  Pt seen for potential diet upgrade given plan of care for DNR, home with hospice and comfort feeding in setting of improving respiratory function and increased time post extubation. Pts observed to have accessory muscle breathing at baseline with effort, but not in distress. Vocal quality clear. Husband reports pt has been consuming some thin liquids for the past few days and is doing well, does not like nectar thick. SLP offered trials of thin liquids with no coughing r throat clearing in contrast with prior observations. Pt did have a slight gurgle post swallow, sounding familiar to a possible mild backflow from cervical esophagus to pharynx, though this cannot be confirmed subjectively. Overall pt tolerated thin liquids well. SLP discussed ongoing potential for aspiration given COPD and respiratory function. Demonstrated aspiration precautions and reviewed them with pts husband with verbalization of understanding. Recommend pt continue a dys 2 (finely chopped) diet with thin liquids. PT and husband in agreement.    HPI HPI: 82 year old female admitted 05/20/17 for bypass surgery on ankle ulcer. Intubated for 5 days and extubated 8/15. PMH significant for COPD, chronic hypoxic respiratory failure, DM2, CHF, HTN, multiple myeloma. CXR = bilateral pleural effusions with underlying atelectasis, worsened on the left, pulmonary edema.      SLP Plan  Continue with current plan of care       Recommendations  Diet recommendations: Dysphagia 2 (fine chop);Thin liquid Liquids provided via: Cup;Straw Medication Administration: Whole meds with puree Supervision: Patient able to self feed;Full supervision/cueing for compensatory  strategies Compensations: Minimize environmental distractions;Slow rate;Small sips/bites Postural Changes and/or Swallow Maneuvers: Seated upright 90 degrees;Upright 30-60 min after meal                Oral Care Recommendations: Oral care BID Follow up Recommendations: None SLP Visit Diagnosis: Dysphagia, unspecified (R13.10) Plan: Continue with current plan of care       GO               [facility], MA CCC-SLP [phone]  Lynann Beaver 06/10/2017, 11:05 AM

## 2017-06-10 NOTE — Progress Notes (Signed)
Progress Note  SUBJECTIVE:    Ready to leave hospital.   OBJECTIVE:   Vitals:   06/09/17 2354 06/10/17 0432  BP: 104/74 105/61  Pulse: (!) 54 61  Resp: 18 18  Temp: 98.6 F (37 C) 97.7 F (36.5 C)  SpO2: 98% 97%    Intake/Output Summary (Last 24 hours) at 06/10/17 0854 Last data filed at 06/10/17 5188  Gross per 24 hour  Intake                0 ml  Output             1150 ml  Net            -1150 ml   Right infraclavicular and groin incisions clean and intact.  PT doppler signals bilaterally.    ASSESSMENT/PLAN:   82 y.o. female is s/p: right axillobifemoral bypass 21 Days Post-Op   Good doppler flow in feet.  Incisions healing well.  For d/c to hospice today.   [name] 06/10/2017 8:54 AM -- LABS:   CBC    Component Value Date/Time   WBC 4.8 06/10/2017 0415   HGB 9.8 (L) 06/10/2017 0415   HCT 32.7 (L) 06/10/2017 0415   PLT 123 (L) 06/10/2017 0415    BMET    Component Value Date/Time   NA 142 06/10/2017 0415   K 3.9 06/10/2017 0415   CL 105 06/10/2017 0415   CO2 30 06/10/2017 0415   GLUCOSE 152 (H) 06/10/2017 0415   BUN 14 06/10/2017 0415   CREATININE 0.72 06/10/2017 0415   CALCIUM 7.7 (L) 06/10/2017 0415   GFRNONAA >60 06/10/2017 0415   GFRAA >60 06/10/2017 0415    COAG Lab Results  Component Value Date   INR 1.06 05/07/2017   INR 0.94 10/15/2012   INR 1.01 10/01/2011   No results found for: PTT  ANTIBIOTICS:   Anti-infectives    Start     Dose/Rate Route Frequency Ordered Stop   06/03/17 1000  ceFEPIme (MAXIPIME) 2 g in dextrose 5 % 50 mL IVPB  Status:  Discontinued     2 g 100 mL/hr over 30 Minutes Intravenous Every 24 hours 06/03/17 0835 06/04/17 1341   05/29/17 1000  ceFEPIme (MAXIPIME) 1 g in dextrose 5 % 50 mL IVPB  Status:  Discontinued     1 g 100 mL/hr over 30 Minutes Intravenous Every 24 hours 05/29/17 0832 06/03/17 0835   05/28/17 1200  cefTRIAXone (ROCEPHIN) 2 g in dextrose 5 % 50 mL IVPB  Status:   Discontinued     2 g 100 mL/hr over 30 Minutes Intravenous Every 24 hours 05/28/17 0737 05/29/17 0832   05/26/17 1200  piperacillin-tazobactam (ZOSYN) IVPB 3.375 g  Status:  Discontinued     3.375 g 12.5 mL/hr over 240 Minutes Intravenous Every 8 hours 05/26/17 1042 05/28/17 0737   05/25/17 1000  piperacillin-tazobactam (ZOSYN) IVPB 2.25 g  Status:  Discontinued     2.25 g 100 mL/hr over 30 Minutes Intravenous Every 8 hours 05/25/17 0745 05/26/17 1042   05/24/17 1800  linezolid (ZYVOX) IVPB 600 mg  Status:  Discontinued     600 mg 300 mL/hr over 60 Minutes Intravenous Every 12 hours 05/24/17 1709 05/27/17 0817   05/24/17 1800  piperacillin-tazobactam (ZOSYN) IVPB 3.375 g  Status:  Discontinued     3.375 g 12.5 mL/hr over 240 Minutes Intravenous Every 8 hours 05/24/17 1713 05/25/17 0745   05/24/17 1715  piperacillin-tazobactam (ZOSYN) IVPB 3.375 g  Status:  Discontinued     3.375 g 100 mL/hr over 30 Minutes Intravenous Every 8 hours 05/24/17 1709 05/24/17 1712   05/20/17 2200  cefUROXime (ZINACEF) 1.5 g in dextrose 5 % 50 mL IVPB     1.5 g 100 mL/hr over 30 Minutes Intravenous Every 12 hours 05/20/17 1756 05/21/17 1034   05/20/17 0850  dextrose 5 % with cefUROXime (ZINACEF) ADS Med    Comments:  Ardine Eng   : cabinet override      05/20/17 0850 05/20/17 1033   05/20/17 0837  cefUROXime (ZINACEF) 1.5 g in dextrose 5 % 50 mL IVPB     1.5 g 100 mL/hr over 30 Minutes Intravenous 30 min pre-op 05/20/17 0837 05/20/17 [place], PA-C [facility] Office: [phone] Pager: [phone] 06/10/2017 8:54 AM

## 2017-06-10 NOTE — Discharge Summary (Signed)
Physician Discharge Summary  KEAMBER MACFADDEN SHF:[id number] DOB: 22-Jan-1935 DOA: 05/20/2017  PCP: Curlene Labrum, MD  Admit date: 05/20/2017 Discharge date: 06/10/2017  Time spent: 35 minutes  Recommendations for Outpatient Follow-up:  Acute respiratory failure with hypoxia/positive Klebsiella & Enterobacter Pneumonia bilateral lower lobe/Aspiration Pneumona -Multifactorial pneumonia, pleural effusion bilateral, COPD, CHF  -Continue 2 L O2 via [place]: Hospice to titrate to maintain patient's SPO2 89-93% -DuoNeb  -Albuterol -Completed 7 day course antibiotics    Cardiogenic Shock/Chronic diastolic CHF/Pulmonary HTN -Secondary to sepsis, adrenal insufficiency, CHF, iatrogenic  -Echocardiogram: CHF and pulmonary hypertension see results below -Hypotensive episodes resolved with decrease in BP medication and increase in stress dose steroids. -Home steroids (prednisone 10 mg daily)   Hypotension  -See iatrogenic adrenal insufficiency -Continued bradycardia, but improved     PVD -S/P bypass   New onset Atrial fibrillation -Currently in NSR - Amiodarone 200 mg daily -strict I&O since admission -5.3 L -Daily weight Filed Weights   06/09/17 0334 06/10/17 0432 06/10/17 0555  Weight: 130 lb 11.2 oz (59.3 kg) 140 lb 12.8 oz (63.9 kg) 135 lb (61.2 kg)     Acute on CKD stage III (baseline Cr 1.04-1.)   -At baseline     Recent Labs Lab 06/05/17 0329 06/06/17 0350 06/07/17 0439 06/08/17 0435 06/09/17 0504 06/10/17 0415  CREATININE 0.79 0.76 0.82 0.77 0.75 0.72     Hypernatremia -Resolved   Right non-healing ulcer over Right lateral malleolus  -S/P surgical repair on 8/7 by vascular surgery, -Continue care per surgery   Ileus: Resolved.   Leukocytosis:  -Resolved   Pancytopenia:  -Multifactorial sepsis and Zyvox -No history IgG multiple myeloma: Multiple myeloma treatment on hold -Platelets stable continue to monitor signs of overt bleeding    Iatrogenic Adrenal  Insufficiency -See CHF   Diabetes Mellitus Type 2 controlled with renal complication:  -5/02 Hemoglobin A1c= 5.9   Delirium -Multifactorial postop pain, chronic benzodiazepine, chronic steroids, hypernatremia -Resolved   Hypokalemia -Potassium goal> 4   Hypomagnesemia -Magnesium goal > 2 -Magnesium oxide 400 mg   Severe protein calorie malnutrition -Continue to encourage patient to eat -Ensure TID between meals -Megace 800 mg daily   Goals of care -PALLIATIVE CARE: Family and patient have agreed to Home Hospice      Discharge Diagnoses:  Active Problems:   IgG multiple myeloma (HCC)   COPD (chronic obstructive pulmonary disease) (HCC)   Aortoiliac occlusive disease (HCC)   Dyspnea   Pressure injury of skin   Multifocal pneumonia   Palliative care by specialist   Pneumonia of both lower lobes due to Klebsiella pneumoniae ([place])   Aspiration pneumonia of both lower lobes due to gastric secretions (HCC)   Cardiogenic shock (HCC)   Arterial hypotension   Pulmonary hypertension ([place])   New onset atrial fibrillation (HCC)   Acute renal failure superimposed on stage 3 chronic kidney disease (HCC)   Hypernatremia   Adrenal insufficiency (HCC)   Controlled type 2 diabetes mellitus with diabetic nephropathy (HCC)   Hypokalemia   Hypomagnesemia   Severe protein-calorie malnutrition ([place])   Discharge Condition: Guarded  Diet recommendation: Dysphagia to fluid consistency thin  Filed Weights   06/09/17 0334 06/10/17 0432 06/10/17 0555  Weight: 130 lb 11.2 oz (59.3 kg) 140 lb 12.8 oz (63.9 kg) 135 lb (61.2 kg)    History of present illness:  82 year old WF PMHx Diabetes type 2 uncontrolled with complication, COPD on home 3 L O2 , CHF, HTN and IgG multiple myeloma  Patient underwent bypass surgery for non-healing ulcer of the ankle. Patient developed subsequent abdominal distention and ileus but refused NG tube placement. Patient became more altered and hypoxic  overnight with nausea and vomiting. Patient had subsequent probable aspiration. Patient was noted to be in atrial fibrillation with rapid ventricular response and hypertensive. Metoprolol and Lasix were administered. Patient was transferred to the ICU and placed on noninvasive positive pressure ventilation. Patient's respiratory status remained marginal at best and ultimately she was endotracheally intubated.  During his hospitalization patient was treated acute respiratory failure with hypoxia, positive Klebsiella pneumonia bilateral lower lobe/aspiration pneumonia. Patient's hospitalization was complicated by cardiogenic shock/chronic diastolic CHF/pulmonary hypertension. After complete course of antibiotics, significant fluid resuscitation, stress dose steroids patient stabilized. Unfortunately although patient's condition stabilized patient still in extremely poor health and not expected to survive for any significant amount time. After patient and family met with PALLIATIVE CARE they decided on [place]. Patient stable for discharge to [facility], not expected to survive> 30 days.   Procedures: 08/07 -Left common femoral artery exposure/Left femorofemoral anastomosis as part of Right axillobifemoral bypass 08/11 - Transferred to ICU w/ respiratory failure & atrial fibrillation >> failed BiPAP & was intubated CT CHEST W/O 8/11: - Dense consolidation of the right lower lobe/ patchy airspace opacities within the remainder of the right lung and left lower lobe, compatible with multifocal pneumonia. -An incidental finding of potential clinical significance has been found. Aneurysmal dilatation of the infrarenal abdominal aorta to 3.3 cm in AP dimension.  -1.2 cm nonspecific hypodensity at the right hepatic lobe. - Chronic compression deformities at T4, T5, T6, T7, T10 and L1. PORT ABD X-RAY 8/12:  Decreased small bowel dilatation since previous study. PORT CXR 8/12:  Right lower lobe opacity with  chronic elevation of right hemidiaphragm. Somewhat lordotic and rotated film.  08/12 - Tolerated 8 hours of PS 12/5 weaning  08/14 - Bowel movements restarted PORT CXR 8/14:  -. Persistent patchy bilateral opacities, right predominant, relatively unchanged when compared with x-ray imaging from 8/12. -No new pleural effusion.  PORT CXR 8/15:  - slight worsening in bilateral pleural effusions versus atelectasis. 08/16 - Hypoglycemic to 64 overnight. 1amp of Bicarb given for acidosis. Increased WOB>>BiPAP. Normal Sinus Rhythm. PORT CXR 8/20:  Personally reviewed by me. Bilateral lower lung opacities & silhouetting of hemidiaphragm. Port and CVC unchanged.  -8/23 Echocardiogram:Left ventricle:  mild LVH. -LVEF = 65% to 70%.  -Grade  2 diastolic dysfunction - Left atrium: Mild to moderately dilated. -- Pulmonary arteries: PA peak pressure: 62 mm Hg (S). 8/23 PCXR: Bilateral pleural effusions, pulmonary venous congestion and bilateral interstitial prominence slightly improved. Pneumonia cannot be excluded 8/25 PCXR: No significant change from [place] on 8/23    Consultations: [facility] Vascular surgery PALLIATIVE CARE   Cultures  MRSA PCR 7/25:  Positive  Blood Cultures x2 8/12:  Negative  Urine Culture 8/12:  Negative  Tracheal Aspirate Culture 8/12:  positive Moderate Klebsiella pneumoniae & Enterobacter cloacae    Antibiotics Anti-infectives    Start     Stop   06/03/17 1000  ceFEPIme (MAXIPIME) 2 g in dextrose 5 % 50 mL IVPB  Status:  Discontinued     06/04/17 1341   05/29/17 1000  ceFEPIme (MAXIPIME) 1 g in dextrose 5 % 50 mL IVPB  Status:  Discontinued     06/03/17 0835   05/28/17 1200  cefTRIAXone (ROCEPHIN) 2 g in dextrose 5 % 50 mL IVPB  Status:  Discontinued  05/29/17 0832   05/26/17 1200  piperacillin-tazobactam (ZOSYN) IVPB 3.375 g  Status:  Discontinued     05/28/17 0737   05/25/17 1000  piperacillin-tazobactam (ZOSYN) IVPB 2.25 g  Status:  Discontinued     05/26/17 1042    05/24/17 1800  linezolid (ZYVOX) IVPB 600 mg  Status:  Discontinued     05/27/17 0817   05/24/17 1800  piperacillin-tazobactam (ZOSYN) IVPB 3.375 g  Status:  Discontinued     05/25/17 0745   05/24/17 1715  piperacillin-tazobactam (ZOSYN) IVPB 3.375 g  Status:  Discontinued     05/24/17 1712   05/20/17 2200  cefUROXime (ZINACEF) 1.5 g in dextrose 5 % 50 mL IVPB     05/21/17 1034   05/20/17 0850  dextrose 5 % with cefUROXime (ZINACEF) ADS Med    Comments:  Ardine Eng   : cabinet override   05/20/17 1033   05/20/17 0837  cefUROXime (ZINACEF) 1.5 g in dextrose 5 % 50 mL IVPB     05/20/17 1043       Discharge Exam: Vitals:   06/10/17 0555 06/10/17 0800 06/10/17 0903 06/10/17 1200  BP:  129/81  128/84  Pulse:  66  61  Resp:  19  20  Temp:  (!) 97.1 F (36.2 C)    TempSrc:  Axillary    SpO2:  96% 97% 96%  Weight: 135 lb (61.2 kg)     Height:        General: A/O 4, chronic respiratory distress, cachectic Cardiovascular: Regular rhythm and rate, negative murmur rubs or gallops, normal S1/S2 Respiratory: Absent breath sounds bibasilar without wheezes or crackles  Discharge Instructions   Allergies as of 06/10/2017      Reactions   Ativan [lorazepam] Other (See Comments)   hallucinations   Amitriptyline Other (See Comments)   HALLUCINATIONS      Medication List    STOP taking these medications   atorvastatin 10 MG tablet Commonly known as:  LIPITOR   Calcium Carbonate-Vitamin D 600-400 MG-UNIT tablet   furosemide 40 MG tablet Commonly known as:  LASIX   lenalidomide 10 MG capsule Commonly known as:  REVLIMID   metFORMIN 500 MG tablet Commonly known as:  GLUCOPHAGE   MULTIVITAMIN ADULTS 50+ PO   nystatin cream Commonly known as:  MYCOSTATIN   potassium chloride SA 20 MEQ tablet Commonly known as:  K-DUR,KLOR-CON     TAKE these medications   albuterol (2.5 MG/3ML) 0.083% nebulizer solution Commonly known as:  PROVENTIL Take 3 mLs (2.5 mg total) by  nebulization every 4 (four) hours as needed for wheezing or shortness of breath.   ALPRAZolam 0.5 MG tablet Commonly known as:  XANAX Take 1 tablet (0.5 mg total) by mouth 2 (two) times daily as needed for anxiety. What changed:  medication strength  how much to take   amiodarone 200 MG tablet Commonly known as:  PACERONE Take 1 tablet (200 mg total) by mouth daily.   aspirin EC 81 MG tablet Take 81 mg by mouth daily.   brimonidine 0.2 % ophthalmic solution Commonly known as:  ALPHAGAN INSTILL ONE DROP IN EACH EYE THREE TIMES DAILY   collagenase ointment Commonly known as:  SANTYL Apply topically 2 (two) times daily.   dorzolamide-timolol 22.3-6.8 MG/ML ophthalmic solution Commonly known as:  COSOPT Place 1 drop into both eyes 2 (two) times daily.   feeding supplement (ENSURE ENLIVE) Liqd Take 237 mLs by mouth 3 (three) times daily between meals.   fluticasone 50  MCG/ACT nasal spray Commonly known as:  FLONASE Place 2 sprays into both nostrils daily as needed for rhinitis (DRYNESS).   HYDROcodone-acetaminophen 10-325 MG tablet Commonly known as:  NORCO Take 1 tablet by mouth every 4 (four) hours as needed. for pain   ipratropium-albuterol 0.5-2.5 (3) MG/3ML Soln Commonly known as:  DUONEB Take 3 mLs by nebulization 2 (two) times daily.   LUMIGAN 0.01 % Soln Generic drug:  bimatoprost INSTILL ONE DROP IN EACH EYE AT BEDTIME   megestrol 400 MG/10ML suspension Commonly known as:  MEGACE Take 20 mLs (800 mg total) by mouth daily.   pantoprazole 40 MG tablet Commonly known as:  PROTONIX Take 1 tablet (40 mg total) by mouth at bedtime.   polyethylene glycol powder powder Commonly known as:  GLYCOLAX/MIRALAX Take 1 Container by mouth daily as needed for mild constipation.   predniSONE 10 MG tablet Commonly known as:  DELTASONE Take 10 mg by mouth daily.   silver sulfADIAZINE 1 % cream Commonly known as:  SILVADENE Apply 1 application topically daily.             Durable Medical Equipment        Start     Ordered   06/10/17 1348  For home use only DME oxygen  Once    Comments:  O2 via [place] at 2 L/min Hospice to titrate oxygen to maintain SPO2 89-93%  Question Answer Comment  Mode or (Route) Nasal cannula   Liters per Minute 2   Frequency Continuous (stationary and portable oxygen unit needed)   Oxygen conserving device Yes   Oxygen delivery system Gas      06/10/17 1348       Discharge Care Instructions        Start     Ordered   06/11/17 0000  amiodarone (PACERONE) 200 MG tablet  Daily     06/10/17 1322   06/11/17 0000  megestrol (MEGACE) 400 MG/10ML suspension  Daily     06/10/17 1322   06/10/17 0000  ALPRAZolam (XANAX) 0.5 MG tablet  2 times daily PRN     06/10/17 1322   06/10/17 0000  albuterol (PROVENTIL) (2.5 MG/3ML) 0.083% nebulizer solution  Every 4 hours PRN     06/10/17 1322   06/10/17 0000  collagenase (SANTYL) ointment  2 times daily     06/10/17 1322   06/10/17 0000  feeding supplement, ENSURE ENLIVE, (ENSURE ENLIVE) LIQD  3 times daily between meals     06/10/17 1322   06/10/17 0000  pantoprazole (PROTONIX) 40 MG tablet  Daily at bedtime     06/10/17 1322     Allergies  Allergen Reactions  . Ativan [Lorazepam] Other (See Comments)    hallucinations  . Amitriptyline Other (See Comments)    HALLUCINATIONS    Contact information for follow-up providers    [facility] Follow up.   Why:  Home Hospice arranged-  Contact information: [place] [address] [phone]            Contact information for after-discharge care    [facility] Follow up.   Specialty:  [place] information: [address] [place] [phone]                   The results of significant diagnostics from this hospitalization (including imaging, microbiology, ancillary and laboratory) are  listed below for reference.  Significant Diagnostic Studies: Dg Abd 1 View  Result Date: 05/24/2017 CLINICAL DATA:  Check nasogastric catheter placement EXAM: ABDOMEN - 1 VIEW COMPARISON:  05/24/2017 FINDINGS: Nasogastric catheter now lies within the stomach. Some decrease in the degree of gastric distention is noted. Persistent small bowel dilatation is seen. Right chest wall port is again seen IMPRESSION: Nasogastric catheter within the stomach. Persistent small bowel dilatation is noted. Electronically Signed   By: Inez Catalina M.D.   On: 05/24/2017 14:14   Ct Chest Wo Contrast  Result Date: 05/25/2017 CLINICAL DATA:  Acute onset of respiratory failure. Sepsis. Initial encounter. EXAM: CT CHEST WITHOUT CONTRAST TECHNIQUE: Multidetector CT imaging of the chest was performed following the standard protocol without IV contrast. COMPARISON:  Chest radiograph performed earlier today at 4:34 p.m., and CT of the chest performed 11/26/2013 FINDINGS: Cardiovascular: Diffuse calcification is noted along the thoracic aorta and proximal great vessels. The heart remains normal in size. Diffuse coronary artery calcifications are seen. Mediastinum/Nodes: Visualized mediastinal nodes remain normal in size. No pericardial effusion is identified. The thyroid gland is unremarkable. No axillary lymphadenopathy is seen. The patient's endotracheal tube is seen ending 4-5 cm above the carina. The enteric tube extends into the stomach. A right-sided chest port and a left subclavian line are noted. Lungs/Pleura: Trace right-sided pleural fluid is noted. There is dense consolidation of the right lower lobe, and patchy airspace opacities are noted within the remainder of the right lung and left lower lobe, compatible with multifocal pneumonia. No pneumothorax is seen. Upper Abdomen: A 1.2 cm hypodensity is noted at the right hepatic lobe. The visualized portions of the liver and spleen are otherwise unremarkable. The visualized  portions of the pancreas, adrenal glands and kidneys are within normal limits. Nonspecific perinephric stranding is noted bilaterally. There is aneurysmal dilatation of the infrarenal abdominal aorta to 3.3 cm in AP dimension. Diffuse calcification is seen along the proximal abdominal aorta. Musculoskeletal: No acute osseous abnormalities are identified. Chronic compression deformities are noted at T4, T5, T6, T7, T10, and L1. The patient is status post vertebroplasty at T6 and T7. Chronic left-sided rib deformities are noted. The visualized musculature is unremarkable in appearance. IMPRESSION: 1. Dense consolidation of the right lower lobe, and patchy airspace opacities within the remainder of the right lung and left lower lobe, compatible with multifocal pneumonia. 2. Trace right-sided pleural fluid noted. 3. Diffuse coronary artery calcifications noted. 4. **An incidental finding of potential clinical significance has been found. Aneurysmal dilatation of the infrarenal abdominal aorta to 3.3 cm in AP dimension. Recommend followup by ultrasound in 3 years. This recommendation follows ACR consensus guidelines: White Paper of the ACR Incidental Findings Committee II on Vascular Findings. J Am Coll Radiol 2013; 72:094-709** 5. Diffuse aortic atherosclerosis. 6. 1.2 cm nonspecific hypodensity at the right hepatic lobe. 7. Chronic compression deformities at T4, T5, T6, T7, T10 and L1. Electronically Signed   By: Garald Balding M.D.   On: 05/25/2017 00:22   Dg Chest Port 1 View  Result Date: 06/07/2017 CLINICAL DATA:  Shortness of breath, increased work of breathing EXAM: PORTABLE CHEST 1 VIEW COMPARISON:  06/05/2017 FINDINGS: Low lung volumes. Possible mild interstitial edema. Mild patchy bilateral lower lobe opacities, possibly atelectasis, with associated small bilateral pleural effusions, left greater than right. No pneumothorax. Cardiomegaly. Right chest port terminates in the upper right atrium. Left  subclavian venous catheter terminates in the mid SVC. IMPRESSION: Possible mild interstitial edema with bilateral pleural effusions, right greater than left. Mild  patchy bilateral lower lobe opacities, likely atelectasis. Electronically Signed   By: Julian Hy M.D.   On: 06/07/2017 12:48   Dg Chest Port 1 View  Result Date: 06/05/2017 CLINICAL DATA:  Shortness of breath . EXAM: PORTABLE CHEST 1 VIEW COMPARISON:  06/02/2017 . FINDINGS: PowerPort catheter and left subclavian line noted in stable position. Prominent aortic atherosclerotic vascular calcification. Stable cardiomegaly. Stable cardiomegaly. Mild pulmonary venous congestion and bilateral interstitial prominence with bilateral pleural effusions suggesting CHF. Bibasilar pneumonia cannot be excluded. Stable pulmonary venous congestion and IMPRESSION: 1. PowerPort catheter and left subclavian line stable position. 2. Cardiomegaly with mild pulmonary venous congestion and bilateral interstitial prominence. Bilateral pleural effusions. Findings suggest mild CHF. Similar findings noted on prior exam . Bibasilar pneumonia cannot be excluded. Electronically Signed   By: Marcello Moores  Register   On: 06/05/2017 07:34   Dg Chest Port 1 View  Result Date: 06/02/2017 CLINICAL DATA:  Respiratory failure. EXAM: PORTABLE CHEST 1 VIEW COMPARISON:  Single-view of the chest 06/01/2017 and 05/31/2017. FINDINGS: Right IJ approach Port-A-Cath and left subclavian central venous catheter remain in place. Bilateral pleural effusions and basilar airspace disease, worse on the right, are unchanged. Heart size is upper normal. No pneumothorax. IMPRESSION: No change in small to moderate bilateral pleural effusions and basilar airspace disease, worse on the right. Electronically Signed   By: Inge Rise M.D.   On: 06/02/2017 07:42   Dg Chest Port 1 View  Result Date: 06/01/2017 CLINICAL DATA:  Acute respiratory failure EXAM: PORTABLE CHEST 1 VIEW COMPARISON:  May 31, 2017 FINDINGS: Stable support apparatus. No pneumothorax. Bilateral pleural effusions with underlying atelectasis, stable on the right and mildly worsened on the left. Mild edema. No other significant interval changes. IMPRESSION: 1. Stable support apparatus. 2. Bilateral pleural effusions with underlying atelectasis, mildly worsened on the left. 3. Pulmonary edema, more prominent in the interval. Electronically Signed   By: Dorise Bullion III M.D   On: 06/01/2017 07:16   Dg Chest Port 1 View  Result Date: 05/31/2017 CLINICAL DATA:  Central line placement. EXAM: PORTABLE CHEST 1 VIEW COMPARISON:  05/28/2017 FINDINGS: Right-sided injectable port is in stable position. Left-sided PICC line is also in stable position terminating at the expected location of the proximal superior vena cava. No new central venous catheters are noted. The cardiac silhouette is enlarged. Mediastinal contours appear intact. Calcific atherosclerotic disease of the aorta. There is no evidence of pneumothorax. Right greater the left pleural effusion. Bibasilar atelectasis. Osseous structures are without acute abnormality. Soft tissues are grossly normal. IMPRESSION: Slightly enlarged right pleural effusion. Bibasilar atelectasis. Electronically Signed   By: Fidela Salisbury M.D.   On: 05/31/2017 17:35   Dg Chest Port 1 View  Result Date: 05/28/2017 CLINICAL DATA:  Short of breath EXAM: PORTABLE CHEST 1 VIEW FINDINGS: Interval removal of endotracheal. Persistent bibasilar atelectasis. Increased effusion at the lung bases, RIGHT greater than LEFT. NG tube remains in stomach. Central venous line unchanged. No pulmonary edema. IMPRESSION: 1. Interval Extubation. 2. No change in bibasilar atelectasis. 3. Increased pleural effusions. Electronically Signed   By: Suzy Bouchard M.D.   On: 05/28/2017 20:46   Dg Chest Port 1 View  Result Date: 05/27/2017 CLINICAL DATA:  Respiratory failure EXAM: PORTABLE CHEST 1 VIEW COMPARISON:   05/26/2017 FINDINGS: Endotracheal tube tip remains 3.6 cm from the carina. Stable right jugular Port-A-Cath and NG tube. Very low volumes. Patchy airspace disease throughout the right lung has improved. Bibasilar atelectasis is worse. Left subclavian central  venous catheter is stable. No pneumothorax. IMPRESSION: Improving patchy airspace disease throughout the right lung Worsening bibasilar atelectasis. Electronically Signed   By: Marybelle Killings M.D.   On: 05/27/2017 07:33   Dg Chest Port 1 View  Result Date: 05/26/2017 CLINICAL DATA:  Ventilator support.  Pneumonia. EXAM: PORTABLE CHEST 1 VIEW COMPARISON:  05/25/2017 FINDINGS: Endotracheal tube tip is 1 cm above the carina. Nasogastric tube enters the abdomen. Power port on the right has its tip at the SVC RA junction. Left subclavian central line has its tip in the SVC 5 cm above the right atrium. Right lower lobe pneumonia/volume loss persists. Similar appearance to yesterday's exam. Heart size is normal. Aortic atherosclerosis again noted. IMPRESSION: No change since yesterday. Persistent right lower lobe volume loss and pneumonia. Lines and tubes in satisfactory position. Electronically Signed   By: Nelson Chimes M.D.   On: 05/26/2017 07:30   Dg Chest Port 1 View  Result Date: 05/25/2017 CLINICAL DATA:  Endotracheal tube, history COPD, GERD, multiple myeloma, CHF, type II diabetes mellitus EXAM: PORTABLE CHEST 1 VIEW COMPARISON:  Portable exam 0514 hours compared to 05/24/2017 FINDINGS: Endotracheal tube, nasogastric tube and RIGHT jugular Port-A-Cath unchanged. Rotated to the RIGHT. Stable heart size mediastinal contours. Persistent RIGHT basilar atelectasis versus consolidation unchanged. Minimal persistent accentuation of markings at LEFT base without segmental consolidation. No pleural effusion or pneumothorax. Bones demineralized. IMPRESSION: Persistent RIGHT lower lobe atelectasis versus consolidation. Electronically Signed   By: Lavonia Dana M.D.    On: 05/25/2017 07:21   Dg Chest Port 1 View  Result Date: 05/24/2017 CLINICAL DATA:  Intubated EXAM: PORTABLE CHEST 1 VIEW COMPARISON:  Chest radiograph from earlier today. FINDINGS: Endotracheal tube tip is 2.9 cm above the carina. Enteric tube enters stomach with the tip not seen on this image. Left subclavian central venous catheter terminates in the middle third of the superior vena cava. Right internal jugular Port-A-Cath terminates over the cavoatrial junction. Stable cardiomediastinal silhouette with mild cardiomegaly and aortic atherosclerosis. No pneumothorax. Stable mild blunting of the right costophrenic angle. No left pleural effusion. No overt pulmonary edema. Patchy consolidation at the medial right lung base is unchanged. IMPRESSION: 1. Well-positioned support structures.  No pneumothorax. 2. Stable patchy consolidation at the medial right lung base, cannot exclude pneumonia. 3. Stable mild blunting of the right costophrenic angle, which may represent a small right pleural effusion. Electronically Signed   By: Ilona Sorrel M.D.   On: 05/24/2017 17:12   Dg Chest Port 1 View  Result Date: 05/24/2017 CLINICAL DATA:  Labored breathing, low oxygen saturation, CHF, COPD, hypertension, type II diabetes mellitus EXAM: PORTABLE CHEST 1 VIEW COMPARISON:  Portable exam 0943 hours compared to 12/04/2016 FINDINGS: Severely rotated to the RIGHT. RIGHT jugular Port-A-Cath unchanged. Upper normal heart size. Lungs appear emphysematous but clear. No gross infiltrate, pleural effusion or pneumothorax. Mild significant osseous demineralization with prior spinal augmentation procedures at adjacent levels in the midthoracic spine. IMPRESSION: Emphysematous changes without acute infiltrate on severely rotated exam. Electronically Signed   By: Lavonia Dana M.D.   On: 05/24/2017 10:11   Dg Abd Portable 1v  Result Date: 05/25/2017 CLINICAL DATA:  Ileus, intubation EXAM: PORTABLE ABDOMEN - 1 VIEW COMPARISON:   Portable exam 0517 hours compared 05/24/2017 FINDINGS: Tip of nasogastric tube projects over gastric antrum. Air-filled minimally distended loops of small bowel consistent with history of ileus, slightly decrease in the previous exam. Paucity of colonic gas. No definite bowel wall thickening. Marked osseous demineralization. Atherosclerotic calcification  aorta. IMPRESSION: Decreased small bowel dilatation since previous study. Electronically Signed   By: Lavonia Dana M.D.   On: 05/25/2017 07:22   Dg Abd Portable 1v  Result Date: 05/24/2017 CLINICAL DATA:  Abdominal distension EXAM: PORTABLE ABDOMEN - 1 VIEW COMPARISON:  Portable exam 1114 hours compared to 05/23/2017 FINDINGS: Significant gaseous distention of stomach. Air-filled loops of small bowel in the mid abdomen. Paucity of colonic gas. Findings could reflect ileus or small bowel obstruction. Atelectasis versus infiltrate at RIGHT lung base. Bones diffusely demineralized. IMPRESSION: Marked gas distention of stomach with additional significant dilatation of small bowel loops, question ileus versus small-bowel obstruction. Electronically Signed   By: Lavonia Dana M.D.   On: 05/24/2017 11:37   Dg Abd Portable 1v  Result Date: 05/23/2017 CLINICAL DATA:  Acute onset of severe generalized abdominal pain, vomiting and distention. Recent right-sided axillobifemoral bypass graft. EXAM: PORTABLE ABDOMEN - 1 VIEW COMPARISON:  Pelvic radiograph performed 07/01/2016 FINDINGS: Distended small bowel loops measure up to 5.8 cm in diameter. The stomach is filled with air. Air and stool filled colonic loops are also seen. This most likely reflects ileus. No free intra-abdominal air is seen, though evaluation for free air is limited on supine views. No acute osseous abnormalities are seen. Diffuse vascular calcifications are identified. The visualized lung bases are clear. IMPRESSION: 1. Distended small bowel loops measure up to 5.8 cm diameter. Air and stool filled  colonic loops also noted. This most likely reflects diffuse ileus. No free intra-abdominal air seen. 2. Diffuse aortic atherosclerosis. Electronically Signed   By: Garald Balding M.D.   On: 05/23/2017 04:49    Microbiology: No results found for this or any previous visit (from the past 240 hour(s)).   Labs: Basic Metabolic Panel:  Recent Labs Lab 06/06/17 0350 06/07/17 0439 06/08/17 0435 06/09/17 0504 06/10/17 0415  NA 144 142 142 141 142  K 3.6 3.8 3.3* 4.1 3.9  CL 110 110 106 106 105  CO2 _0 GLUCOSE 152* 184* 136* 179* 152*  BUN _1 CREATININE 0.76 0.82 0.77 0.75 0.72  CALCIUM 7.4* 7.3* 7.3* 7.6* 7.7*  MG 2.0 1.7 1.9 1.9 1.8  PHOS 2.9 3.0 4.3 2.8 2.3*   Liver Function Tests:  Recent Labs Lab 06/06/17 0350 06/07/17 0439 06/08/17 0435 06/09/17 0504 06/10/17 0415  ALBUMIN 2.0* 2.2* 2.5* 2.3* 2.4*   No results for input(s): LIPASE, AMYLASE in the last 168 hours. No results for input(s): AMMONIA in the last 168 hours. CBC:  Recent Labs Lab 06/06/17 0350 06/07/17 0439 06/08/17 0435 06/09/17 0504 06/10/17 0415  WBC 4.1 4.1 4.5 4.1 4.8  NEUTROABS 3.5 3.4 3.6 3.5 4.1  HGB 7.8* 9.3* 10.3* 9.7* 9.8*  HCT 26.0* 30.4* 33.2* 31.8* 32.7*  MCV 93.2 90.2 92.0 91.9 92.9  PLT 111* 112* 125* 122* 123*   Cardiac Enzymes: No results for input(s): CKTOTAL, CKMB, CKMBINDEX, TROPONINI in the last 168 hours. BNP: BNP (last 3 results)  Recent Labs  09/17/16 1317 05/25/17 1028  BNP 187.0* 908.6*    ProBNP (last 3 results) No results for input(s): PROBNP in the last 8760 hours.  CBG:  Recent Labs Lab 06/09/17 0613 06/09/17 1124 06/09/17 1624 06/09/17 2209 06/10/17 0603  GLUCAP 153* 161* 175* 237* 166*       Signed:  Dia Crawford, MD [facility] [phone] pager

## 2017-06-10 NOTE — Care Management Note (Signed)
Case Management Note Marvetta Gibbons RN, BSN Unit 4E-Case Manager [phone]  Patient Details  Name: Brooke Keller MRN: [id number] Date of Birth: 06/13/1935  Subjective/Objective:   Pt admitted  s/p: Right axillobifemoral bypass.- has prevena wound vacs placed               Action/Plan: PTA pt lived at home with spouse- PT/OT evals ordered/pending- CM will follow for recommendations and d/c needs  Expected Discharge Date:  06/10/17               Expected Discharge Plan:  Home w Hospice Care  In-House Referral:  Clinical Social Work  Discharge planning Services  CM Consult  Post Acute Care Choice:  Hospice Choice offered to:  Adult Children  DME Arranged:    DME Agency:     HH Arranged:  Disease Management [place] Agency:  [facility]  Status of Service:  Completed, signed off  If discussed at [place] of Stay Meetings, dates discussed:  8/21, 8/23  Discharge Disposition: home with home hospice   Additional Comments:  06/10/17- 1130- Marvetta Gibbons RN, CM- have spoken with Kennyth Lose at [facility] everything is set up for pt for d/c home today with Hospice to see pt in the home later this afternoon- Have spoken with MD and plan will be to d/c today-  1400- call made to [facility] to set up transportation- they should be here by 3pm- for transport home- paperwork on shadowchart- bedside RN aware- Husband at the bedside- Kennyth Lose with [facility] updated- D/C summary available and Kennyth Lose will pull from Epic. .   06/09/17- 1200- Karlita Lichtman RN, CM- spoke with Journalist, newspaper with [facility] this AM - plan is for pt to go home with Home Hospice- per [facility] she has faxed referral to [facility] which can take pt with [facility] for home hospice- call made to [facility] to f/u on referral- spoke with Oris Drone- who states that they have not received fax due to issues with their fax machine- gave this CM # for hospital leasoin- Kennyth Lose- call made to [facility] who states she is on her way to  hospital to f/u on referral and get needed paperwork.  Update- 1448- received call from Kennyth Lose- with [facility]- pt has been accepted into Hospice however due to staffing they can not admit pt tonight and would need to admit in the AM- have notified MD and plan will be to d/c pt in the AM via ambulance home with [facility] to see pt in the home tomorrow. Gold DNR on chart for d/c along with MOST form- CM will f/u in am for d/c arrangements and transportation home via [facility].   06/05/17- Dennis RN, CM- per PT- recommendation for SNF- [facility] following for possible placement- if pt progresses enough family would like to take pt home- CM to follow  Dawayne Patricia, RN 06/10/2017, 2:05 PM

## 2017-06-10 NOTE — Progress Notes (Signed)
Subclavian removed per order and per protocol. Family and pt instructed not to remove bandage for at least 24h. Call bell and phone within reach. Will continue to monitor.

## 2017-06-25 DIAGNOSIS — J9621 Acute and chronic respiratory failure with hypoxia: Secondary | ICD-10-CM | POA: Diagnosis not present

## 2017-06-25 DIAGNOSIS — Z9981 Dependence on supplemental oxygen: Secondary | ICD-10-CM | POA: Diagnosis not present

## 2017-06-25 DIAGNOSIS — R069 Unspecified abnormalities of breathing: Secondary | ICD-10-CM | POA: Diagnosis not present

## 2017-06-25 DIAGNOSIS — Z79899 Other long term (current) drug therapy: Secondary | ICD-10-CM | POA: Diagnosis not present

## 2017-06-25 DIAGNOSIS — Z66 Do not resuscitate: Secondary | ICD-10-CM | POA: Diagnosis not present

## 2017-06-25 DIAGNOSIS — I10 Essential (primary) hypertension: Secondary | ICD-10-CM | POA: Diagnosis not present

## 2017-06-25 DIAGNOSIS — E785 Hyperlipidemia, unspecified: Secondary | ICD-10-CM | POA: Diagnosis not present

## 2017-06-25 DIAGNOSIS — Z87891 Personal history of nicotine dependence: Secondary | ICD-10-CM | POA: Diagnosis not present

## 2017-06-25 DIAGNOSIS — J989 Respiratory disorder, unspecified: Secondary | ICD-10-CM | POA: Diagnosis not present

## 2017-06-25 DIAGNOSIS — E871 Hypo-osmolality and hyponatremia: Secondary | ICD-10-CM | POA: Diagnosis not present

## 2017-06-25 DIAGNOSIS — J441 Chronic obstructive pulmonary disease with (acute) exacerbation: Secondary | ICD-10-CM | POA: Diagnosis not present

## 2017-06-25 DIAGNOSIS — Z7984 Long term (current) use of oral hypoglycemic drugs: Secondary | ICD-10-CM | POA: Diagnosis not present

## 2017-06-25 DIAGNOSIS — J449 Chronic obstructive pulmonary disease, unspecified: Secondary | ICD-10-CM | POA: Diagnosis not present

## 2017-06-25 DIAGNOSIS — R0602 Shortness of breath: Secondary | ICD-10-CM | POA: Diagnosis not present

## 2017-06-25 DIAGNOSIS — E119 Type 2 diabetes mellitus without complications: Secondary | ICD-10-CM | POA: Diagnosis not present

## 2017-06-25 DIAGNOSIS — J189 Pneumonia, unspecified organism: Secondary | ICD-10-CM | POA: Diagnosis not present

## 2017-06-25 DIAGNOSIS — J440 Chronic obstructive pulmonary disease with acute lower respiratory infection: Secondary | ICD-10-CM | POA: Diagnosis not present

## 2017-06-25 DIAGNOSIS — C9000 Multiple myeloma not having achieved remission: Secondary | ICD-10-CM | POA: Diagnosis not present

## 2017-06-29 DIAGNOSIS — R279 Unspecified lack of coordination: Secondary | ICD-10-CM | POA: Diagnosis not present

## 2017-06-29 DIAGNOSIS — Z7401 Bed confinement status: Secondary | ICD-10-CM | POA: Diagnosis not present

## 2017-07-09 ENCOUNTER — Encounter: Payer: Medicare Other | Admitting: Vascular Surgery

## 2017-07-11 DIAGNOSIS — J449 Chronic obstructive pulmonary disease, unspecified: Secondary | ICD-10-CM | POA: Diagnosis not present

## 2017-07-14 DEATH — deceased

## 2017-07-25 ENCOUNTER — Other Ambulatory Visit: Payer: Self-pay | Admitting: Nurse Practitioner

## 2018-04-07 IMAGING — DX DG CHEST 2V
2 series · 2 of 2 positions shown · non-contrast
Comparison: 09/01/2016

CLINICAL DATA: Shortness of Breath

EXAM:
CHEST  2 VIEW

[chest pa]
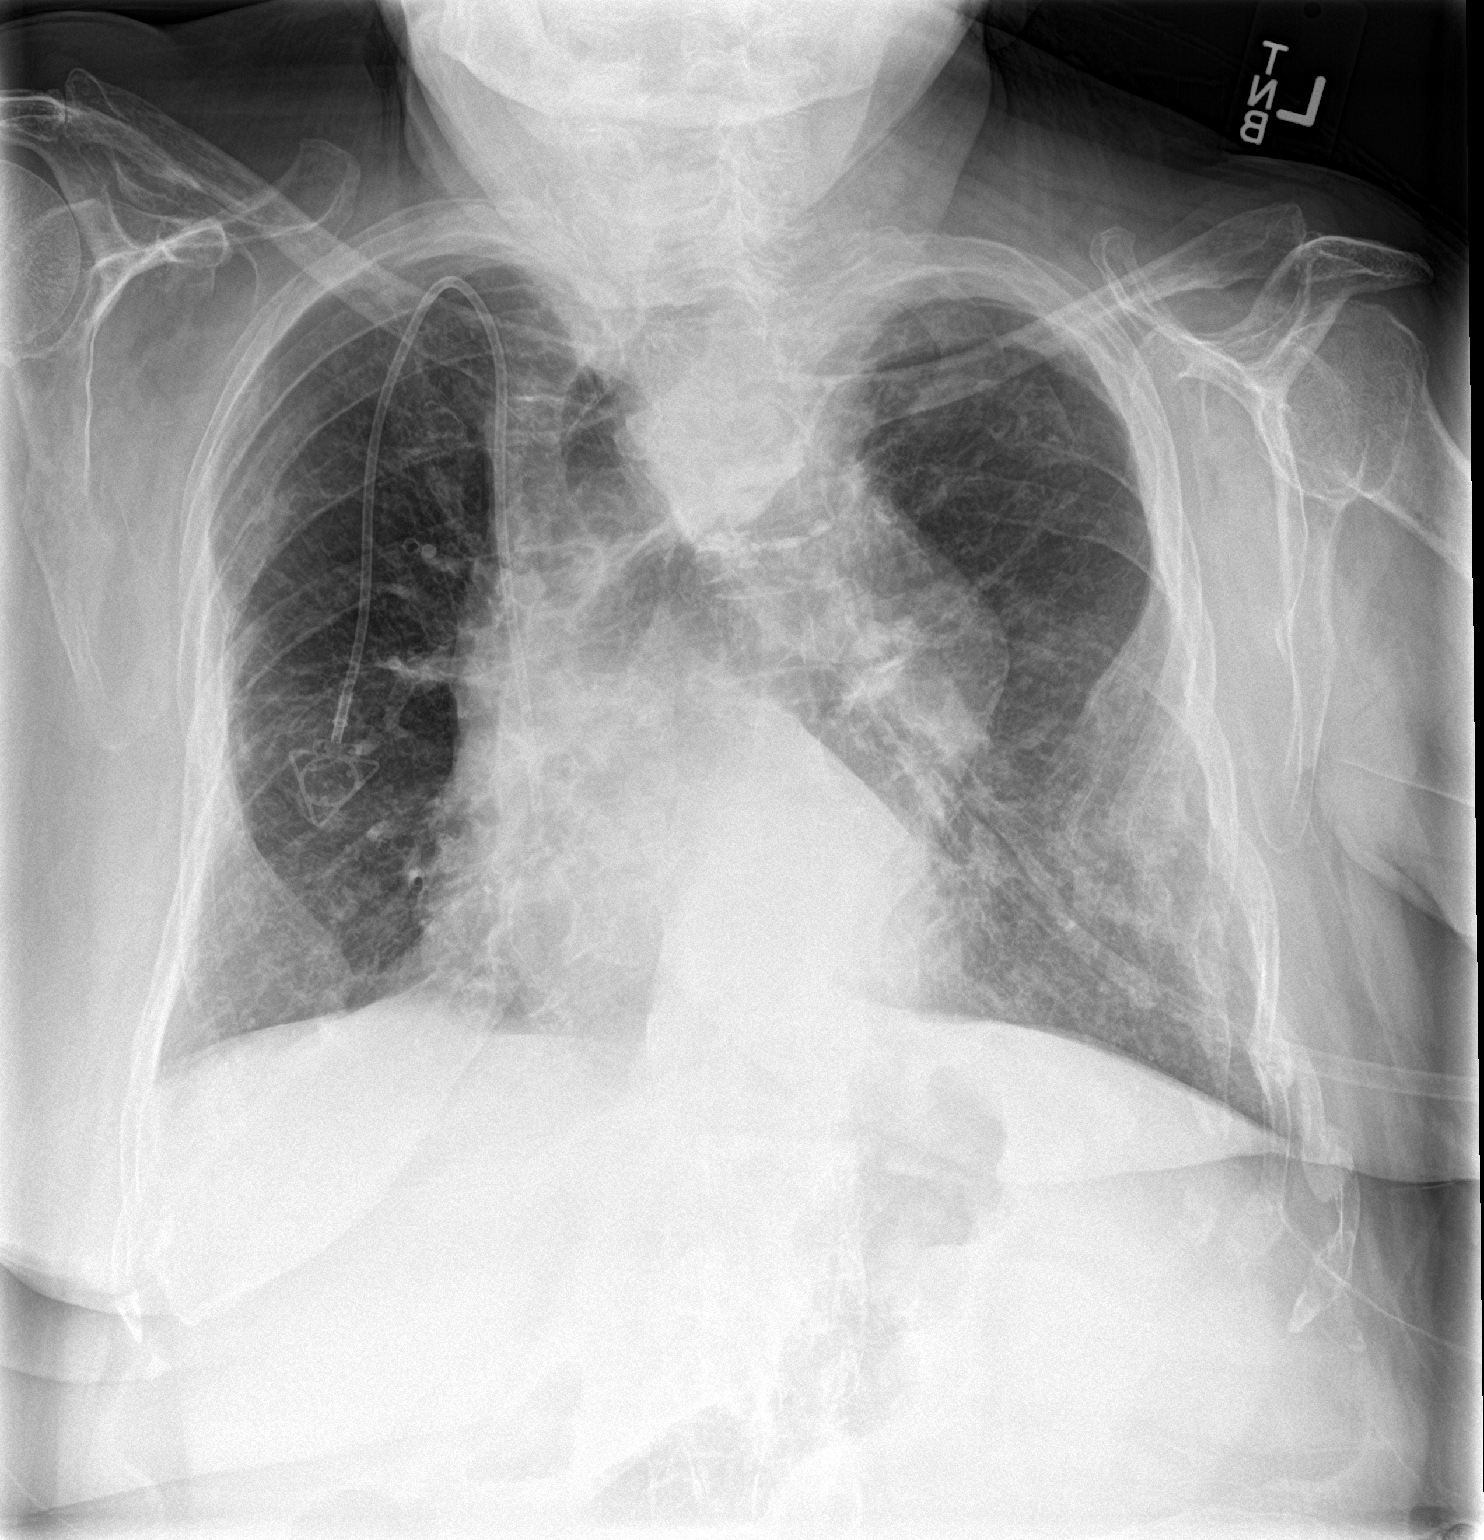

[chest lat]
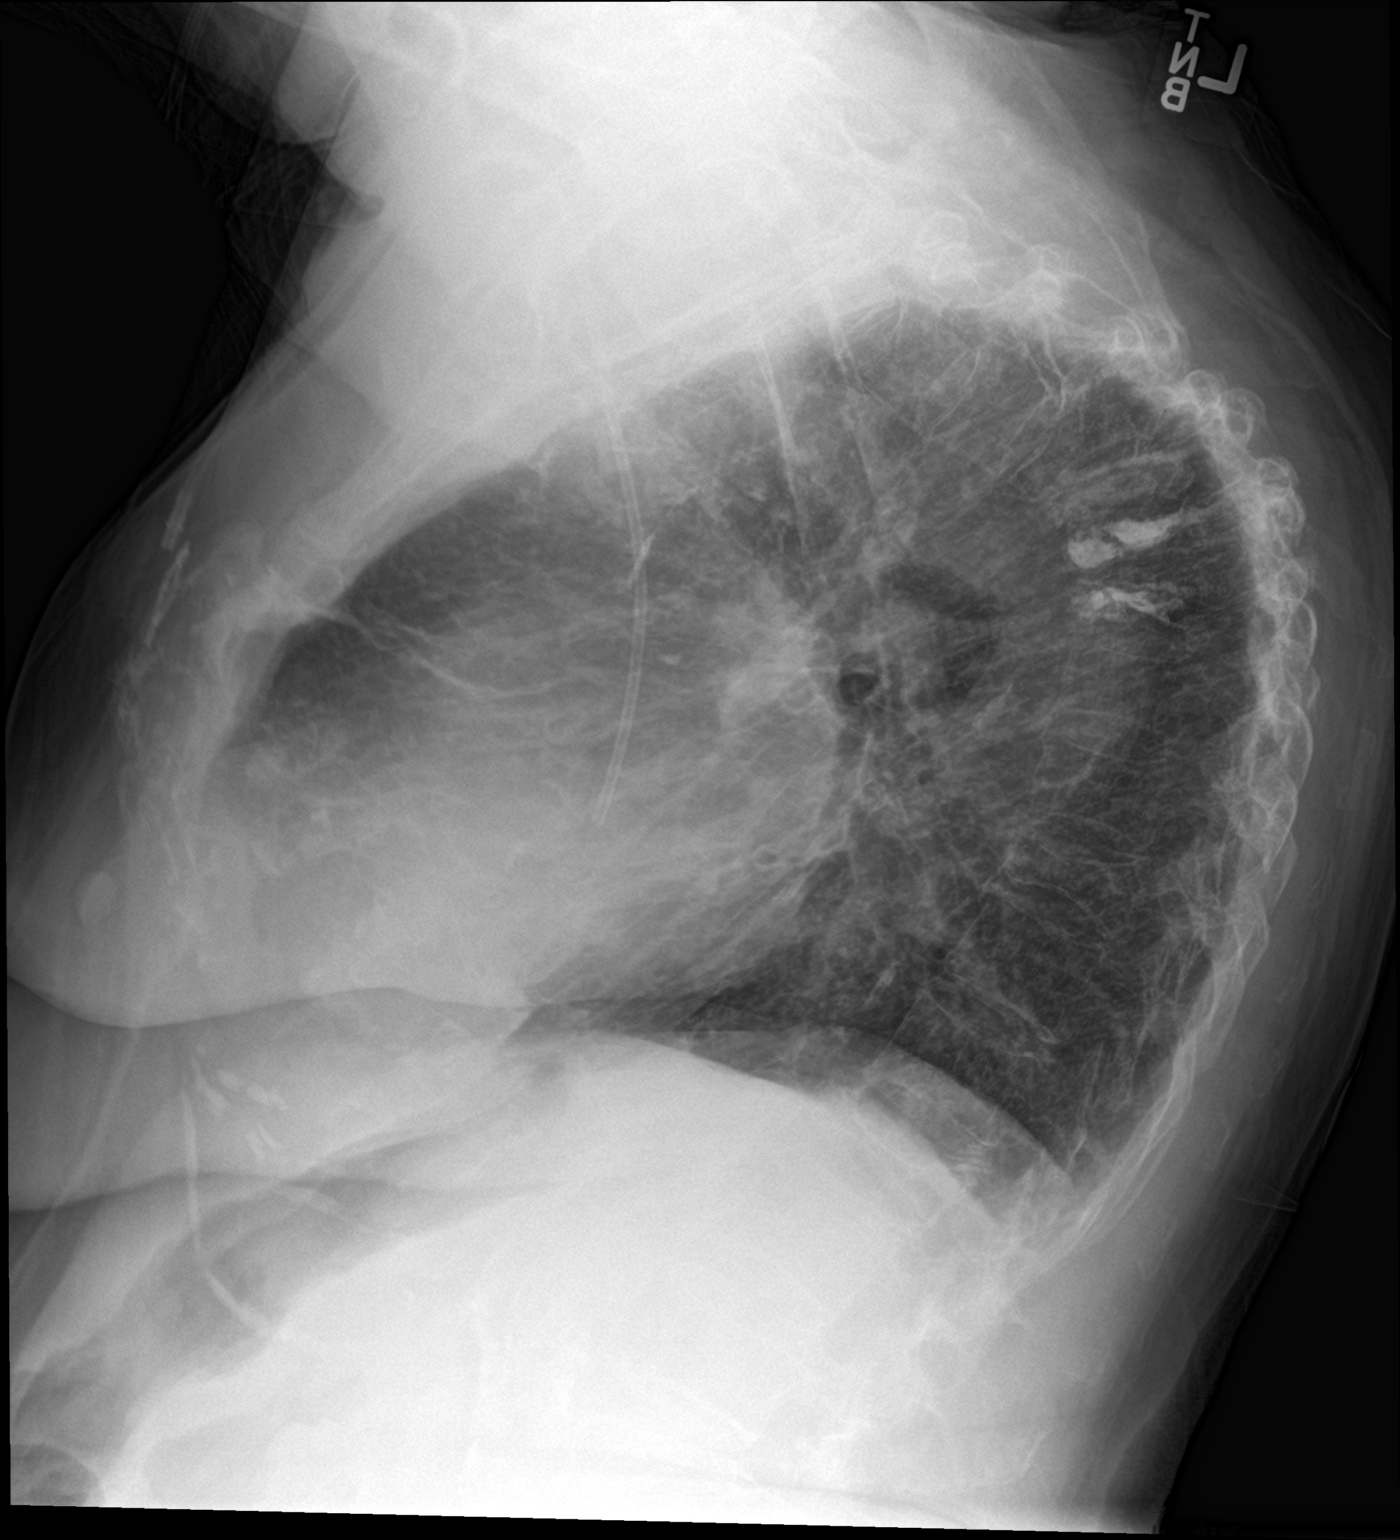

[2 of 2 positions shown; findings below may reference images not displayed]

FINDINGS: Cardiomegaly again noted. Old bilateral rib fractures. Stable right
IJ Port-A-Cath position. No infiltrate or pulmonary edema. Thoracic
spine osteopenia. Prior vertebroplasty mid thoracic spine. Mild
compression deformities lower thoracic spine of indeterminate age.
IMPRESSION: No active cardiopulmonary disease. Cardiomegaly again noted. Stable
right Port-A-Cath position.

## 2018-12-11 IMAGING — DX DG ABD PORTABLE 1V
1 series · 2 of 2 positions shown · non-contrast
Comparison: Pelvic radiograph performed 07/01/2016

CLINICAL DATA: Acute onset of severe generalized abdominal pain,
vomiting and distention. Recent right-sided axillobifemoral bypass
graft.

EXAM:
PORTABLE ABDOMEN - 1 VIEW

[Series 1: abdomen · 0.14mm/px · 2 of 2 slices shown]
[im 1/2]
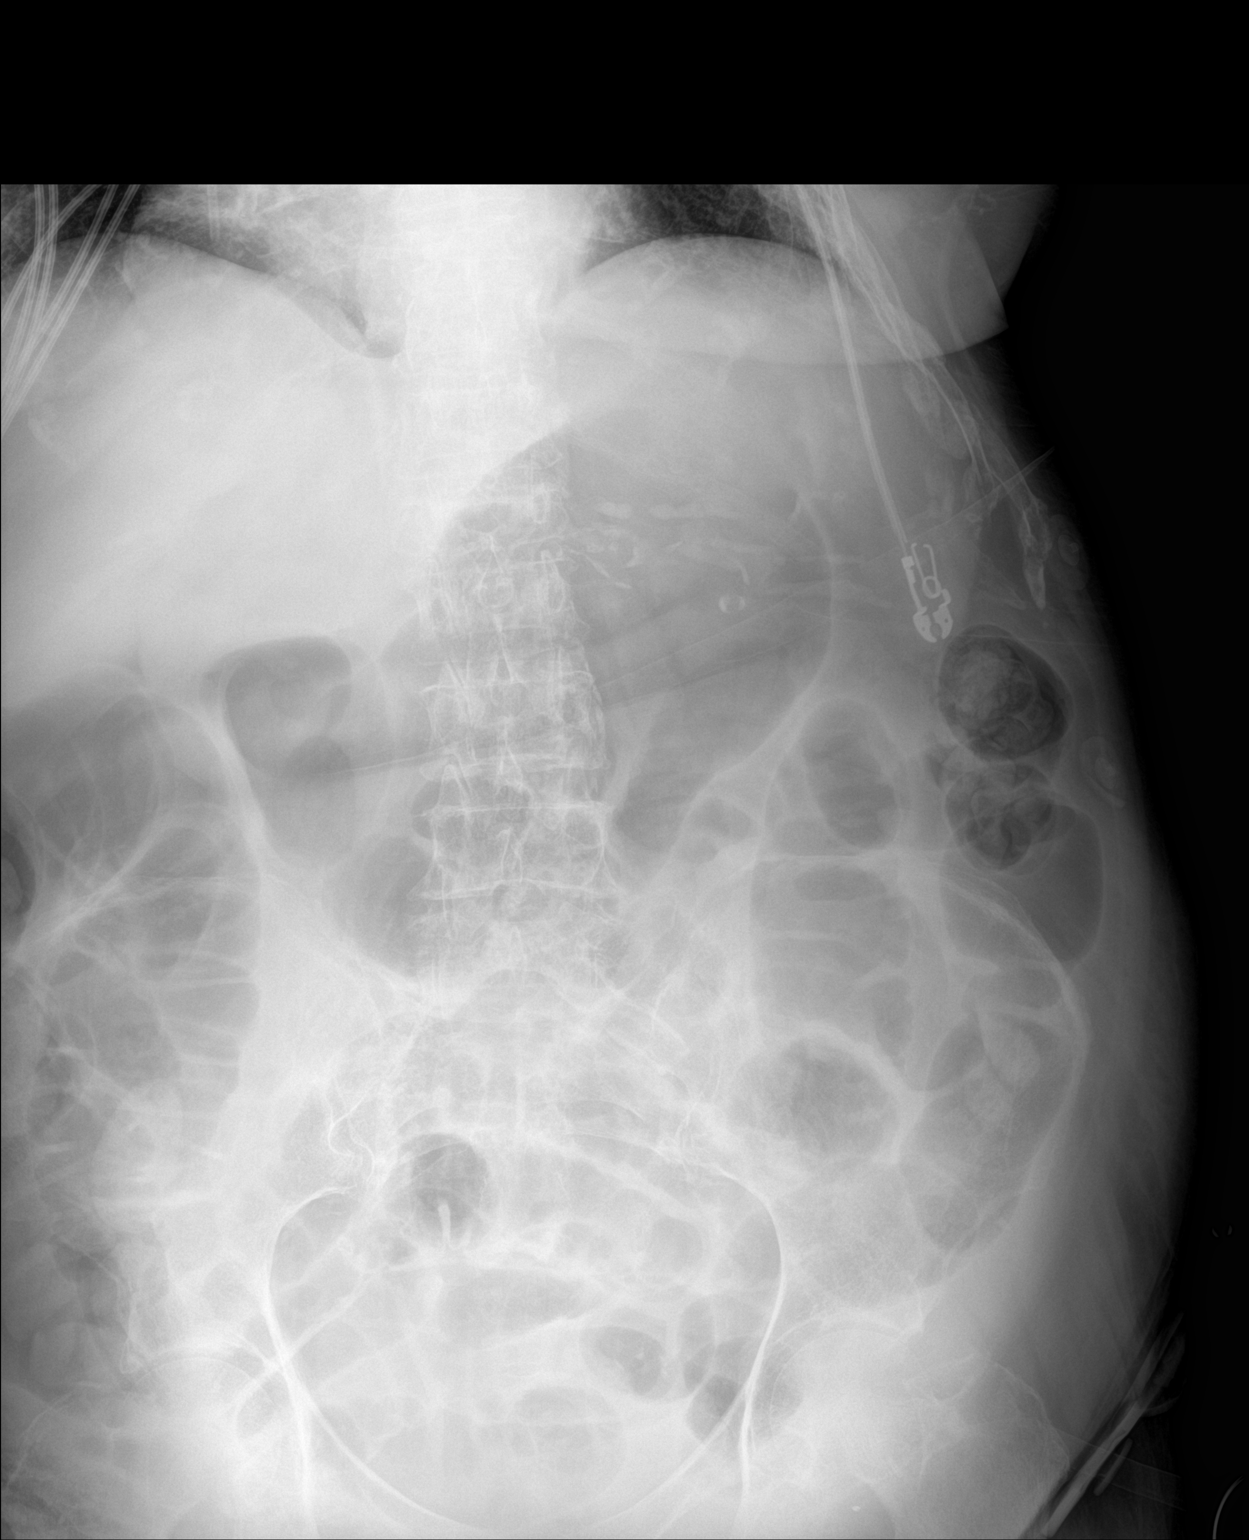
[im 2/2]
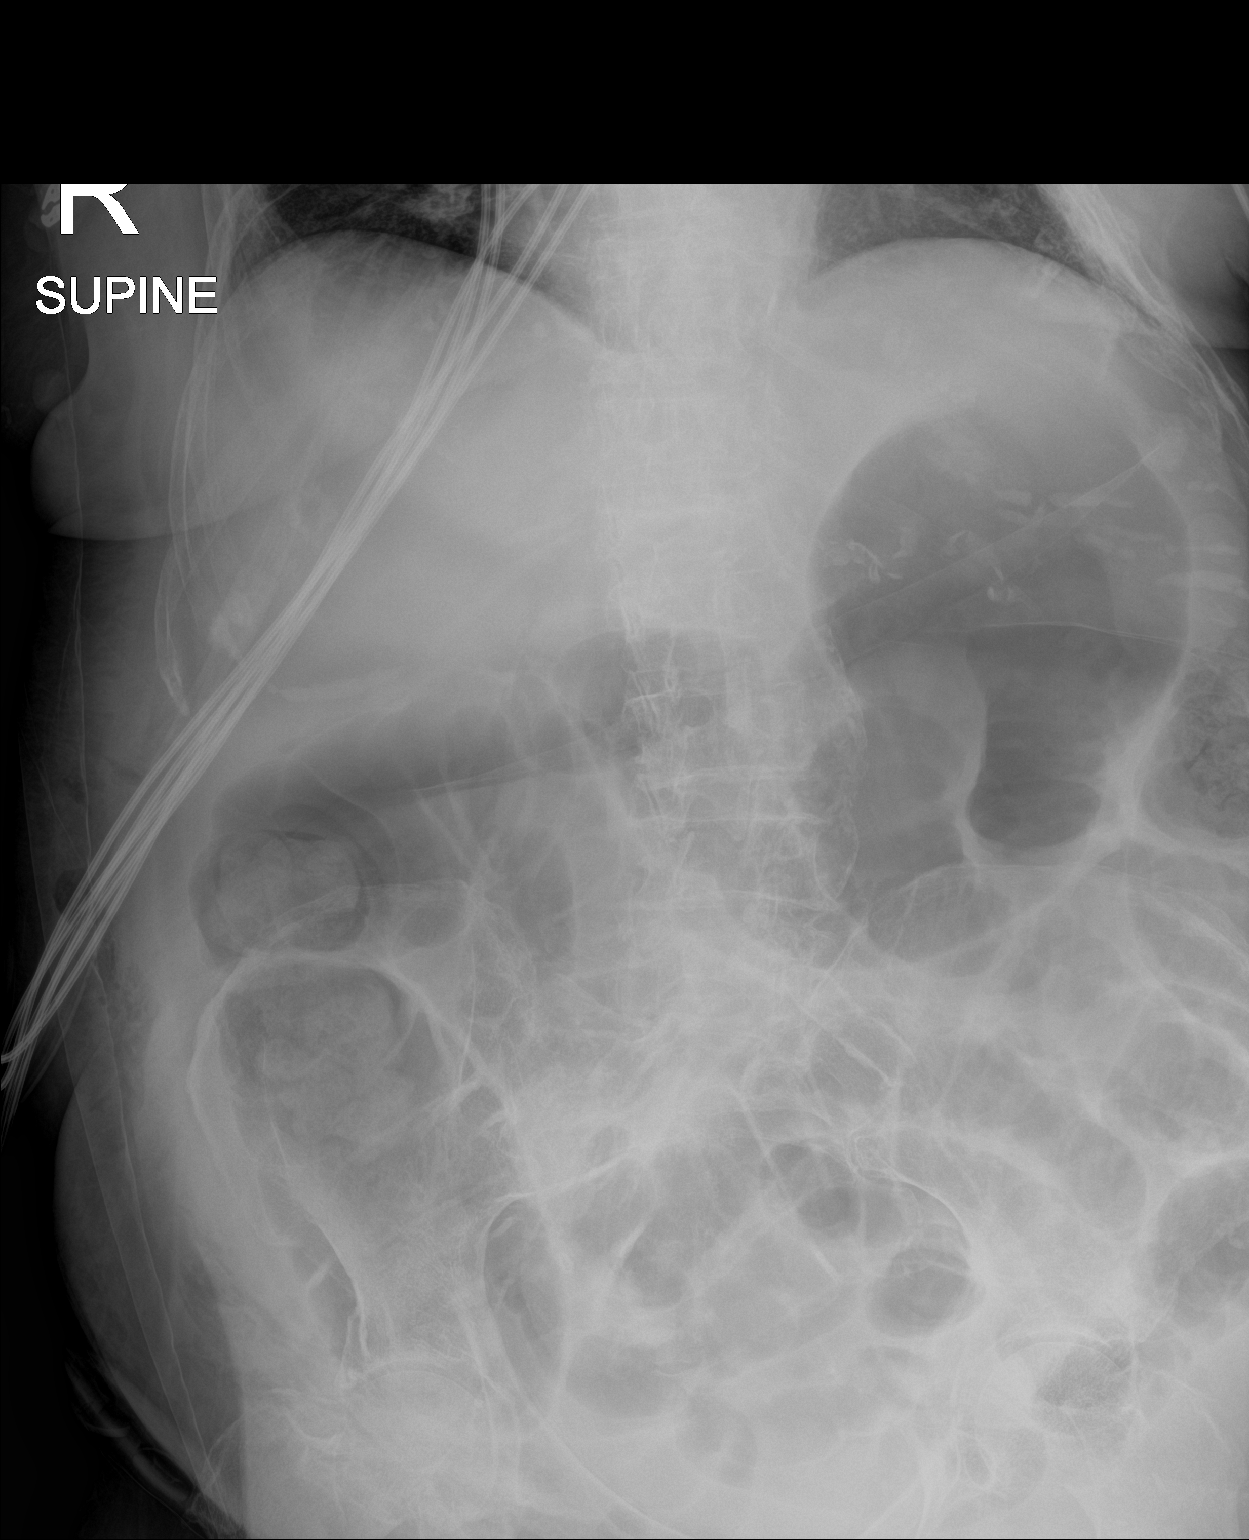

[2 of 2 positions shown; findings below may reference images not displayed]

FINDINGS: Distended small bowel loops measure up to 5.8 cm in diameter. The
stomach is filled with air. Air and stool filled colonic loops are
also seen. This most likely reflects ileus. No free intra-abdominal
air is seen, though evaluation for free air is limited on supine
views.

No acute osseous abnormalities are seen. Diffuse vascular
calcifications are identified. The visualized lung bases are clear.
IMPRESSION: 1. Distended small bowel loops measure up to 5.8 cm diameter. Air
and stool filled colonic loops also noted. This most likely reflects
diffuse ileus. No free intra-abdominal air seen.
2. Diffuse aortic atherosclerosis.

## 2018-12-12 IMAGING — DX DG ABD PORTABLE 1V
1 series · 1 of 1 positions shown · non-contrast
Comparison: Portable exam 3332 hours compared to 05/23/2017

CLINICAL DATA: Abdominal distension

EXAM:
PORTABLE ABDOMEN - 1 VIEW

[abdomen kub]
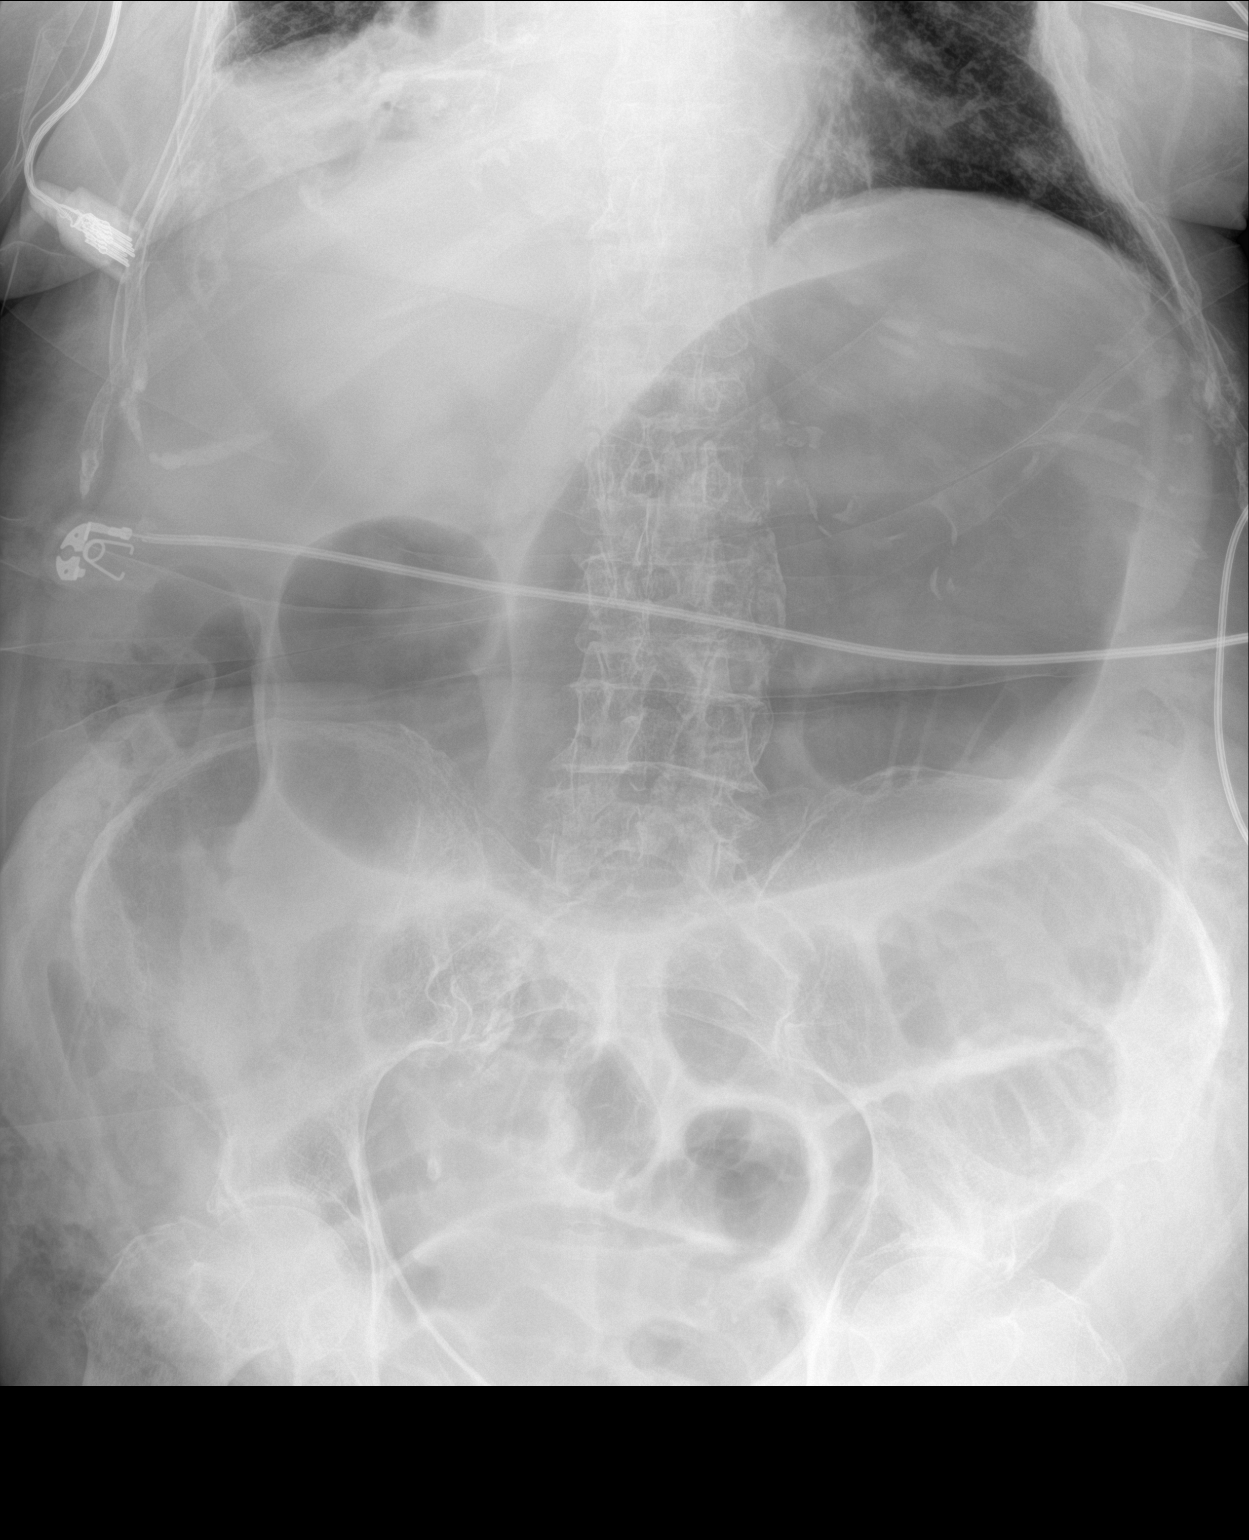

[1 of 1 positions shown; findings below may reference images not displayed]

FINDINGS: Significant gaseous distention of stomach.

Air-filled loops of small bowel in the mid abdomen.

Paucity of colonic gas.

Findings could reflect ileus or small bowel obstruction.

Atelectasis versus infiltrate at RIGHT lung base.

Bones diffusely demineralized.
IMPRESSION: Marked gas distention of stomach with additional significant
dilatation of small bowel loops, question ileus versus small-bowel
obstruction.

## 2018-12-12 IMAGING — DX DG CHEST 1V PORT
1 series · 1 of 1 positions shown · non-contrast
Comparison: Portable exam 1159 hours compared to 12/04/2016

CLINICAL DATA: Labored breathing, low oxygen saturation, CHF, COPD,
hypertension, type II diabetes mellitus

EXAM:
PORTABLE CHEST 1 VIEW

[chest ap]
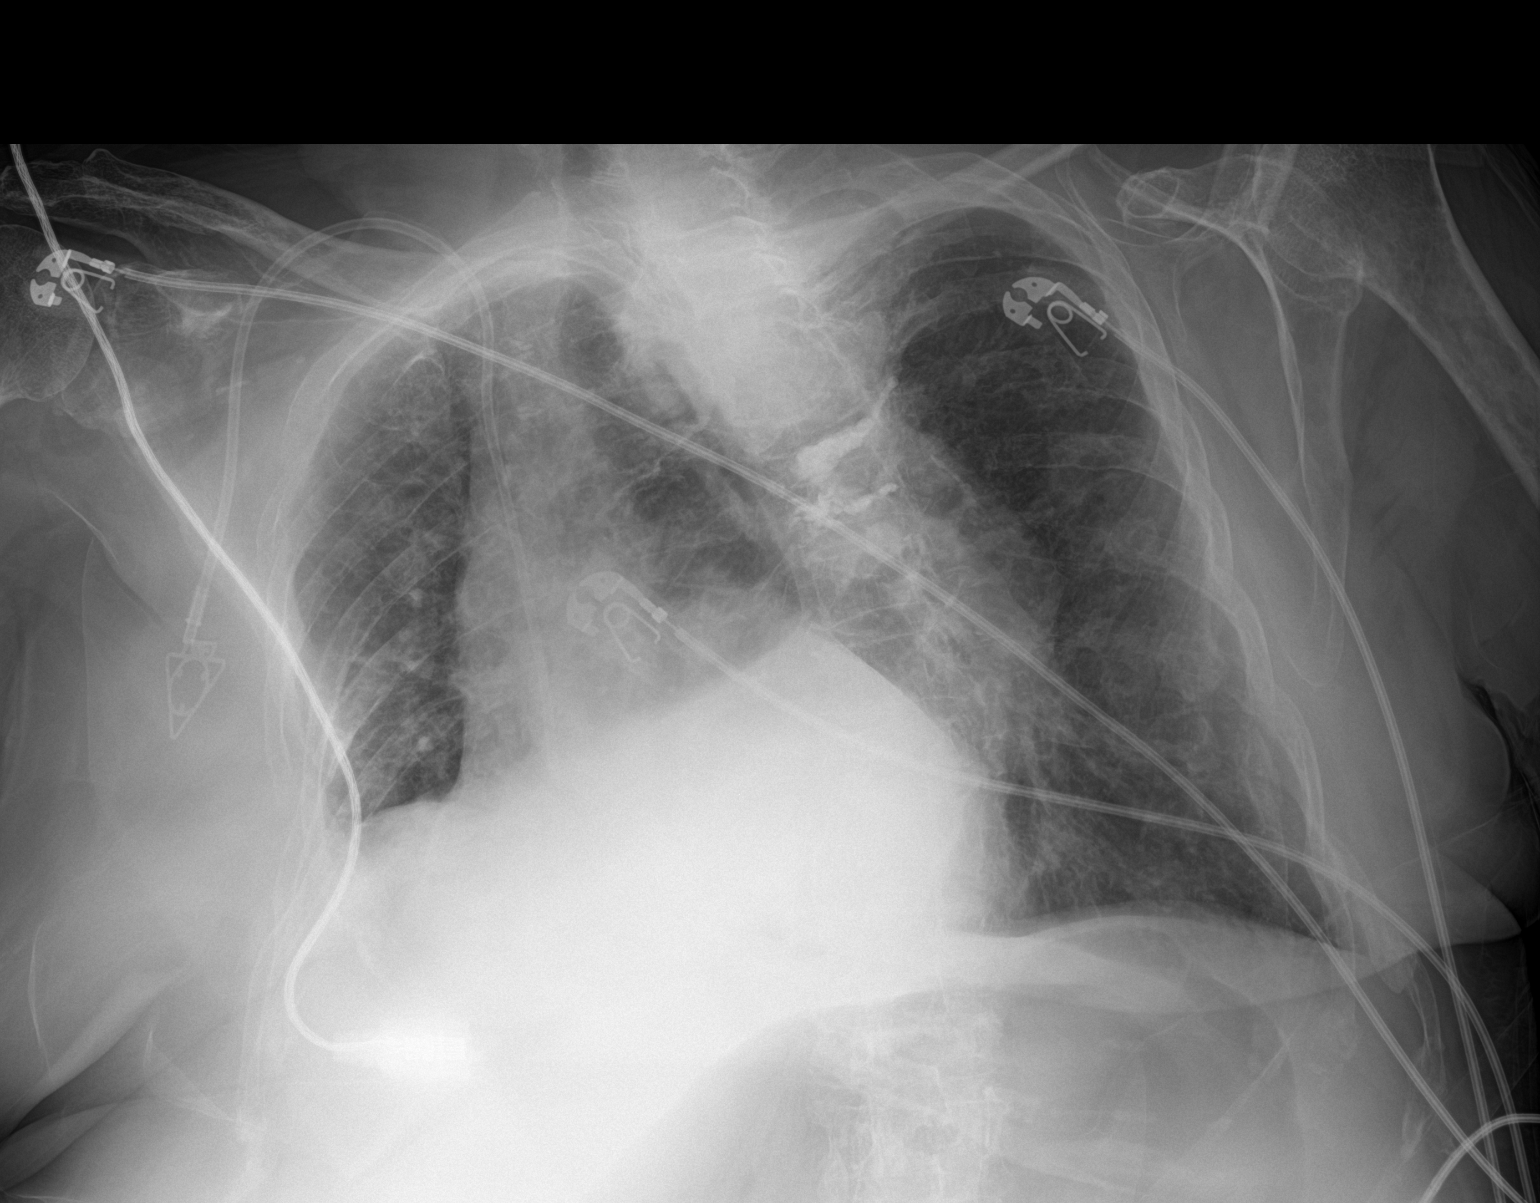

[1 of 1 positions shown; findings below may reference images not displayed]

FINDINGS: Severely rotated to the RIGHT.

RIGHT jugular Port-A-Cath unchanged.

Upper normal heart size.

Lungs appear emphysematous but clear.

No gross infiltrate, pleural effusion or pneumothorax.

Mild significant osseous demineralization with prior spinal
augmentation procedures at adjacent levels in the midthoracic spine.
IMPRESSION: Emphysematous changes without acute infiltrate on severely rotated
exam.

## 2018-12-13 IMAGING — CR DG CHEST 1V PORT
1 series · 1 of 1 positions shown · non-contrast
Comparison: Portable exam 9721 hours compared to 05/24/2017

CLINICAL DATA: Endotracheal tube, history COPD, GERD, multiple
myeloma, CHF, type II diabetes mellitus

EXAM:
PORTABLE CHEST 1 VIEW

[AP]
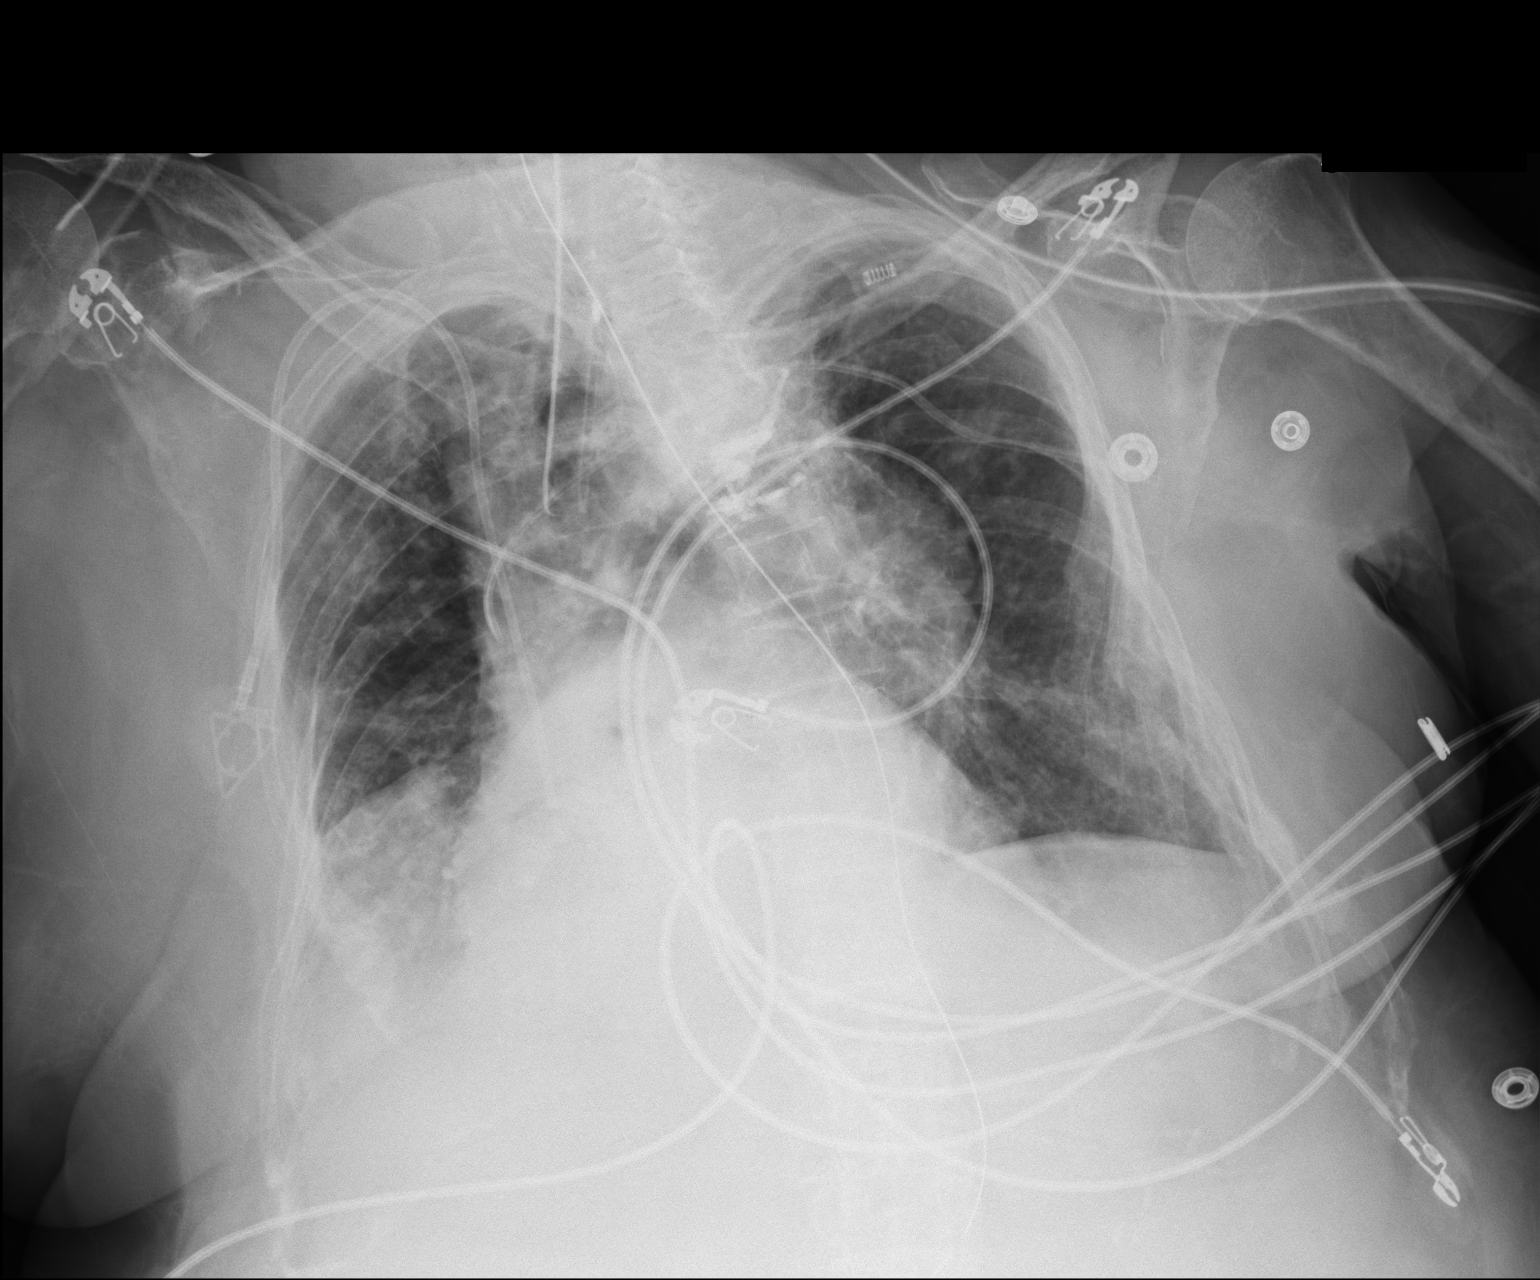

[1 of 1 positions shown; findings below may reference images not displayed]

FINDINGS: Endotracheal tube, nasogastric tube and RIGHT jugular Port-A-Cath
unchanged.

Rotated to the RIGHT.

Stable heart size mediastinal contours.

Persistent RIGHT basilar atelectasis versus consolidation unchanged.

Minimal persistent accentuation of markings at LEFT base without
segmental consolidation.

No pleural effusion or pneumothorax.

Bones demineralized.
IMPRESSION: Persistent RIGHT lower lobe atelectasis versus consolidation.
